# Patient Record
Sex: Female | Born: 1965 | Race: White | Hispanic: No | Smoking: Current every day smoker
Health system: Southern US, Community
[De-identification: ages and names within clinical notes are randomized; demographics above are authoritative.]

## PROBLEM LIST (undated history)

## (undated) DIAGNOSIS — N189 Chronic kidney disease, unspecified: Secondary | ICD-10-CM

## (undated) DIAGNOSIS — F329 Major depressive disorder, single episode, unspecified: Secondary | ICD-10-CM

## (undated) DIAGNOSIS — F1090 Alcohol use, unspecified, uncomplicated: Secondary | ICD-10-CM

## (undated) DIAGNOSIS — F419 Anxiety disorder, unspecified: Secondary | ICD-10-CM

## (undated) DIAGNOSIS — Z7289 Other problems related to lifestyle: Secondary | ICD-10-CM

## (undated) DIAGNOSIS — F1910 Other psychoactive substance abuse, uncomplicated: Secondary | ICD-10-CM

## (undated) DIAGNOSIS — F4310 Post-traumatic stress disorder, unspecified: Secondary | ICD-10-CM

## (undated) DIAGNOSIS — B1920 Unspecified viral hepatitis C without hepatic coma: Secondary | ICD-10-CM

## (undated) DIAGNOSIS — S069X9A Unspecified intracranial injury with loss of consciousness of unspecified duration, initial encounter: Secondary | ICD-10-CM

## (undated) DIAGNOSIS — F32A Depression, unspecified: Secondary | ICD-10-CM

## (undated) DIAGNOSIS — S0990XA Unspecified injury of head, initial encounter: Secondary | ICD-10-CM

## (undated) DIAGNOSIS — I499 Cardiac arrhythmia, unspecified: Secondary | ICD-10-CM

## (undated) DIAGNOSIS — J449 Chronic obstructive pulmonary disease, unspecified: Secondary | ICD-10-CM

## (undated) DIAGNOSIS — M79602 Pain in left arm: Secondary | ICD-10-CM

## (undated) DIAGNOSIS — T8182XA Emphysema (subcutaneous) resulting from a procedure, initial encounter: Secondary | ICD-10-CM

## (undated) HISTORY — PX: TUBAL LIGATION: SHX77

## (undated) HISTORY — PX: FOOT SURGERY: SHX648

---

## 1998-02-27 ENCOUNTER — Emergency Department (HOSPITAL_COMMUNITY): Admission: EM | Admit: 1998-02-27 | Discharge: 1998-02-27 | Payer: Self-pay | Admitting: Emergency Medicine

## 1998-08-16 ENCOUNTER — Emergency Department (HOSPITAL_COMMUNITY): Admission: EM | Admit: 1998-08-16 | Discharge: 1998-08-16 | Payer: Self-pay | Admitting: *Deleted

## 1998-08-17 ENCOUNTER — Encounter: Payer: Self-pay | Admitting: Emergency Medicine

## 1998-08-23 ENCOUNTER — Emergency Department (HOSPITAL_COMMUNITY): Admission: EM | Admit: 1998-08-23 | Discharge: 1998-08-23 | Payer: Self-pay | Admitting: Emergency Medicine

## 1998-08-23 ENCOUNTER — Encounter: Payer: Self-pay | Admitting: Emergency Medicine

## 1998-08-24 ENCOUNTER — Ambulatory Visit (HOSPITAL_COMMUNITY): Admission: RE | Admit: 1998-08-24 | Discharge: 1998-08-24 | Payer: Self-pay | Admitting: Orthopaedic Surgery

## 1998-08-29 ENCOUNTER — Emergency Department (HOSPITAL_COMMUNITY): Admission: EM | Admit: 1998-08-29 | Discharge: 1998-08-29 | Payer: Self-pay | Admitting: Emergency Medicine

## 1998-08-30 ENCOUNTER — Ambulatory Visit (HOSPITAL_BASED_OUTPATIENT_CLINIC_OR_DEPARTMENT_OTHER): Admission: RE | Admit: 1998-08-30 | Discharge: 1998-08-30 | Payer: Self-pay | Admitting: Orthopaedic Surgery

## 1998-09-02 ENCOUNTER — Emergency Department (HOSPITAL_COMMUNITY): Admission: EM | Admit: 1998-09-02 | Discharge: 1998-09-02 | Payer: Self-pay | Admitting: Emergency Medicine

## 1998-09-13 ENCOUNTER — Ambulatory Visit (HOSPITAL_COMMUNITY): Admission: RE | Admit: 1998-09-13 | Discharge: 1998-09-13 | Payer: Self-pay | Admitting: Orthopaedic Surgery

## 2000-11-02 ENCOUNTER — Emergency Department (HOSPITAL_COMMUNITY): Admission: EM | Admit: 2000-11-02 | Discharge: 2000-11-02 | Payer: Self-pay | Admitting: Emergency Medicine

## 2000-11-02 ENCOUNTER — Encounter: Payer: Self-pay | Admitting: Emergency Medicine

## 2000-12-03 ENCOUNTER — Encounter: Payer: Self-pay | Admitting: Emergency Medicine

## 2000-12-03 ENCOUNTER — Emergency Department (HOSPITAL_COMMUNITY): Admission: EM | Admit: 2000-12-03 | Discharge: 2000-12-03 | Payer: Self-pay | Admitting: *Deleted

## 2000-12-14 ENCOUNTER — Emergency Department (HOSPITAL_COMMUNITY): Admission: EM | Admit: 2000-12-14 | Discharge: 2000-12-14 | Payer: Self-pay | Admitting: Emergency Medicine

## 2000-12-18 ENCOUNTER — Emergency Department (HOSPITAL_COMMUNITY): Admission: EM | Admit: 2000-12-18 | Discharge: 2000-12-19 | Payer: Self-pay | Admitting: Emergency Medicine

## 2000-12-18 ENCOUNTER — Encounter: Payer: Self-pay | Admitting: Emergency Medicine

## 2000-12-30 ENCOUNTER — Encounter: Payer: Self-pay | Admitting: Emergency Medicine

## 2000-12-30 ENCOUNTER — Emergency Department (HOSPITAL_COMMUNITY): Admission: EM | Admit: 2000-12-30 | Discharge: 2000-12-30 | Payer: Self-pay | Admitting: *Deleted

## 2001-01-14 ENCOUNTER — Emergency Department (HOSPITAL_COMMUNITY): Admission: EM | Admit: 2001-01-14 | Discharge: 2001-01-14 | Payer: Self-pay | Admitting: Emergency Medicine

## 2002-06-24 ENCOUNTER — Emergency Department (HOSPITAL_COMMUNITY): Admission: EM | Admit: 2002-06-24 | Discharge: 2002-06-24 | Payer: Self-pay | Admitting: Emergency Medicine

## 2002-10-07 ENCOUNTER — Emergency Department (HOSPITAL_COMMUNITY): Admission: EM | Admit: 2002-10-07 | Discharge: 2002-10-07 | Payer: Self-pay | Admitting: Emergency Medicine

## 2003-03-10 ENCOUNTER — Encounter: Payer: Self-pay | Admitting: Emergency Medicine

## 2003-03-10 ENCOUNTER — Emergency Department (HOSPITAL_COMMUNITY): Admission: EM | Admit: 2003-03-10 | Discharge: 2003-03-10 | Payer: Self-pay | Admitting: Emergency Medicine

## 2003-03-19 ENCOUNTER — Emergency Department (HOSPITAL_COMMUNITY): Admission: EM | Admit: 2003-03-19 | Discharge: 2003-03-19 | Payer: Self-pay | Admitting: Emergency Medicine

## 2003-03-21 ENCOUNTER — Encounter: Payer: Self-pay | Admitting: Emergency Medicine

## 2003-03-21 ENCOUNTER — Emergency Department (HOSPITAL_COMMUNITY): Admission: EM | Admit: 2003-03-21 | Discharge: 2003-03-21 | Payer: Self-pay | Admitting: Emergency Medicine

## 2003-03-31 ENCOUNTER — Emergency Department (HOSPITAL_COMMUNITY): Admission: EM | Admit: 2003-03-31 | Discharge: 2003-03-31 | Payer: Self-pay | Admitting: Emergency Medicine

## 2003-04-28 ENCOUNTER — Emergency Department (HOSPITAL_COMMUNITY): Admission: AD | Admit: 2003-04-28 | Discharge: 2003-04-28 | Payer: Self-pay | Admitting: Family Medicine

## 2003-05-03 ENCOUNTER — Emergency Department (HOSPITAL_COMMUNITY): Admission: EM | Admit: 2003-05-03 | Discharge: 2003-05-03 | Payer: Self-pay | Admitting: Emergency Medicine

## 2003-05-27 ENCOUNTER — Emergency Department (HOSPITAL_COMMUNITY): Admission: EM | Admit: 2003-05-27 | Discharge: 2003-05-27 | Payer: Self-pay | Admitting: Emergency Medicine

## 2003-08-11 ENCOUNTER — Emergency Department (HOSPITAL_COMMUNITY): Admission: EM | Admit: 2003-08-11 | Discharge: 2003-08-11 | Payer: Self-pay | Admitting: Emergency Medicine

## 2004-01-03 ENCOUNTER — Emergency Department (HOSPITAL_COMMUNITY): Admission: EM | Admit: 2004-01-03 | Discharge: 2004-01-03 | Payer: Self-pay | Admitting: Emergency Medicine

## 2004-02-20 ENCOUNTER — Emergency Department (HOSPITAL_COMMUNITY): Admission: EM | Admit: 2004-02-20 | Discharge: 2004-02-20 | Payer: Self-pay | Admitting: Emergency Medicine

## 2004-05-12 ENCOUNTER — Emergency Department (HOSPITAL_COMMUNITY): Admission: EM | Admit: 2004-05-12 | Discharge: 2004-05-12 | Payer: Self-pay | Admitting: Emergency Medicine

## 2004-07-15 ENCOUNTER — Emergency Department (HOSPITAL_COMMUNITY): Admission: EM | Admit: 2004-07-15 | Discharge: 2004-07-16 | Payer: Self-pay | Admitting: Emergency Medicine

## 2005-01-03 ENCOUNTER — Emergency Department (HOSPITAL_COMMUNITY): Admission: EM | Admit: 2005-01-03 | Discharge: 2005-01-03 | Payer: Self-pay | Admitting: Emergency Medicine

## 2005-04-20 ENCOUNTER — Emergency Department (HOSPITAL_COMMUNITY): Admission: EM | Admit: 2005-04-20 | Discharge: 2005-04-20 | Payer: Self-pay | Admitting: Emergency Medicine

## 2005-08-15 ENCOUNTER — Emergency Department (HOSPITAL_COMMUNITY): Admission: EM | Admit: 2005-08-15 | Discharge: 2005-08-15 | Payer: Self-pay | Admitting: *Deleted

## 2005-12-09 ENCOUNTER — Emergency Department (HOSPITAL_COMMUNITY): Admission: EM | Admit: 2005-12-09 | Discharge: 2005-12-09 | Payer: Self-pay | Admitting: Emergency Medicine

## 2005-12-11 ENCOUNTER — Emergency Department (HOSPITAL_COMMUNITY): Admission: EM | Admit: 2005-12-11 | Discharge: 2005-12-11 | Payer: Self-pay | Admitting: Emergency Medicine

## 2006-01-16 ENCOUNTER — Emergency Department (HOSPITAL_COMMUNITY): Admission: EM | Admit: 2006-01-16 | Discharge: 2006-01-16 | Payer: Self-pay | Admitting: Emergency Medicine

## 2006-06-09 ENCOUNTER — Emergency Department (HOSPITAL_COMMUNITY): Admission: EM | Admit: 2006-06-09 | Discharge: 2006-06-09 | Payer: Self-pay | Admitting: Emergency Medicine

## 2006-08-05 ENCOUNTER — Emergency Department (HOSPITAL_COMMUNITY): Admission: EM | Admit: 2006-08-05 | Discharge: 2006-08-05 | Payer: Self-pay | Admitting: Emergency Medicine

## 2006-10-20 ENCOUNTER — Emergency Department (HOSPITAL_COMMUNITY): Admission: EM | Admit: 2006-10-20 | Discharge: 2006-10-20 | Payer: Self-pay | Admitting: Emergency Medicine

## 2006-12-02 ENCOUNTER — Emergency Department (HOSPITAL_COMMUNITY): Admission: EM | Admit: 2006-12-02 | Discharge: 2006-12-02 | Payer: Self-pay | Admitting: Emergency Medicine

## 2006-12-29 ENCOUNTER — Emergency Department (HOSPITAL_COMMUNITY): Admission: EM | Admit: 2006-12-29 | Discharge: 2006-12-29 | Payer: Self-pay | Admitting: Emergency Medicine

## 2007-04-16 ENCOUNTER — Emergency Department (HOSPITAL_COMMUNITY): Admission: EM | Admit: 2007-04-16 | Discharge: 2007-04-16 | Payer: Self-pay | Admitting: Emergency Medicine

## 2007-06-15 ENCOUNTER — Emergency Department (HOSPITAL_COMMUNITY): Admission: EM | Admit: 2007-06-15 | Discharge: 2007-06-15 | Payer: Self-pay | Admitting: Emergency Medicine

## 2007-12-01 ENCOUNTER — Emergency Department (HOSPITAL_COMMUNITY): Admission: EM | Admit: 2007-12-01 | Discharge: 2007-12-01 | Payer: Self-pay | Admitting: Emergency Medicine

## 2008-04-03 ENCOUNTER — Emergency Department (HOSPITAL_BASED_OUTPATIENT_CLINIC_OR_DEPARTMENT_OTHER): Admission: EM | Admit: 2008-04-03 | Discharge: 2008-04-03 | Payer: Self-pay | Admitting: Emergency Medicine

## 2008-04-26 ENCOUNTER — Emergency Department (HOSPITAL_BASED_OUTPATIENT_CLINIC_OR_DEPARTMENT_OTHER): Admission: EM | Admit: 2008-04-26 | Discharge: 2008-04-26 | Payer: Self-pay | Admitting: Emergency Medicine

## 2008-05-02 ENCOUNTER — Emergency Department (HOSPITAL_BASED_OUTPATIENT_CLINIC_OR_DEPARTMENT_OTHER): Admission: EM | Admit: 2008-05-02 | Discharge: 2008-05-02 | Payer: Self-pay | Admitting: Emergency Medicine

## 2008-07-13 ENCOUNTER — Emergency Department (HOSPITAL_COMMUNITY): Admission: EM | Admit: 2008-07-13 | Discharge: 2008-07-13 | Payer: Self-pay | Admitting: *Deleted

## 2009-01-22 ENCOUNTER — Emergency Department (HOSPITAL_COMMUNITY): Admission: EM | Admit: 2009-01-22 | Discharge: 2009-01-22 | Payer: Self-pay | Admitting: Emergency Medicine

## 2009-03-29 ENCOUNTER — Emergency Department (HOSPITAL_COMMUNITY): Admission: EM | Admit: 2009-03-29 | Discharge: 2009-03-29 | Payer: Self-pay | Admitting: Emergency Medicine

## 2010-10-14 LAB — ETHANOL: Alcohol, Ethyl (B): <5 mg/dL (ref 0–10)

## 2010-10-14 LAB — DIFFERENTIAL
Basophils Absolute: 0.1 10*3/uL (ref 0.0–0.1)
Basophils Relative: 1 % (ref 0–1)
Eosinophils Relative: 7 % — ABNORMAL HIGH (ref 0–5)
Lymphocytes Relative: 23 % (ref 12–46)
Monocytes Absolute: 0.9 10*3/uL (ref 0.1–1.0)

## 2010-10-14 LAB — LIPASE, BLOOD: Lipase: 34 U/L (ref 11–59)

## 2010-10-14 LAB — COMPREHENSIVE METABOLIC PANEL
AST: 34 U/L (ref 0–37)
Albumin: 3.5 g/dL (ref 3.5–5.2)
BUN: 9 mg/dL (ref 6–23)
GFR calc Af Amer: >60 mL/min (ref >60)
Glucose, Bld: 104 mg/dL — ABNORMAL HIGH (ref 70–99)

## 2010-10-14 LAB — CBC
Hemoglobin: 13.3 g/dL (ref 12.0–15.0)
MCHC: 33.5 g/dL (ref 30.0–36.0)
Platelets: 251 10*3/uL (ref 150–400)
WBC: 11 10*3/uL — ABNORMAL HIGH (ref 4.0–10.5)

## 2010-10-14 LAB — RAPID URINE DRUG SCREEN, HOSP PERFORMED
Cocaine: POSITIVE — AB
Opiates: NOT DETECTED
Tetrahydrocannabinol: NOT DETECTED

## 2011-01-29 ENCOUNTER — Emergency Department (HOSPITAL_COMMUNITY): Payer: Medicaid Other

## 2011-01-29 ENCOUNTER — Inpatient Hospital Stay (HOSPITAL_COMMUNITY)
Admission: EM | Admit: 2011-01-29 | Discharge: 2011-02-03 | DRG: 871 | Disposition: A | Discharge status: Home / Self Care | Payer: Medicaid Other | Attending: Internal Medicine | Admitting: Internal Medicine

## 2011-01-29 ENCOUNTER — Encounter (HOSPITAL_COMMUNITY): Payer: Self-pay

## 2011-01-29 DIAGNOSIS — F341 Dysthymic disorder: Secondary | ICD-10-CM | POA: Diagnosis present

## 2011-01-29 DIAGNOSIS — E8809 Other disorders of plasma-protein metabolism, not elsewhere classified: Secondary | ICD-10-CM | POA: Diagnosis present

## 2011-01-29 DIAGNOSIS — F17200 Nicotine dependence, unspecified, uncomplicated: Secondary | ICD-10-CM | POA: Diagnosis present

## 2011-01-29 DIAGNOSIS — J449 Chronic obstructive pulmonary disease, unspecified: Secondary | ICD-10-CM | POA: Diagnosis present

## 2011-01-29 DIAGNOSIS — A4151 Sepsis due to Escherichia coli [E. coli]: Principal | ICD-10-CM | POA: Diagnosis present

## 2011-01-29 DIAGNOSIS — N170 Acute kidney failure with tubular necrosis: Secondary | ICD-10-CM | POA: Diagnosis present

## 2011-01-29 DIAGNOSIS — F1020 Alcohol dependence, uncomplicated: Secondary | ICD-10-CM | POA: Diagnosis present

## 2011-01-29 DIAGNOSIS — Z79899 Other long term (current) drug therapy: Secondary | ICD-10-CM

## 2011-01-29 DIAGNOSIS — D6959 Other secondary thrombocytopenia: Secondary | ICD-10-CM | POA: Diagnosis present

## 2011-01-29 DIAGNOSIS — J4489 Other specified chronic obstructive pulmonary disease: Secondary | ICD-10-CM | POA: Diagnosis present

## 2011-01-29 DIAGNOSIS — R319 Hematuria, unspecified: Secondary | ICD-10-CM | POA: Diagnosis present

## 2011-01-29 DIAGNOSIS — Z791 Long term (current) use of non-steroidal anti-inflammatories (NSAID): Secondary | ICD-10-CM

## 2011-01-29 DIAGNOSIS — A419 Sepsis, unspecified organism: Secondary | ICD-10-CM | POA: Diagnosis present

## 2011-01-29 DIAGNOSIS — N12 Tubulo-interstitial nephritis, not specified as acute or chronic: Secondary | ICD-10-CM | POA: Diagnosis present

## 2011-01-29 DIAGNOSIS — F1410 Cocaine abuse, uncomplicated: Secondary | ICD-10-CM | POA: Diagnosis present

## 2011-01-29 HISTORY — DX: Anxiety disorder, unspecified: F41.9

## 2011-01-29 HISTORY — DX: Major depressive disorder, single episode, unspecified: F32.9

## 2011-01-29 HISTORY — DX: Chronic obstructive pulmonary disease, unspecified: J44.9

## 2011-01-29 HISTORY — DX: Depression, unspecified: F32.A

## 2011-01-29 LAB — POCT I-STAT, CHEM 8
Chloride: 96 mEq/L (ref 96–112)
Creatinine, Ser: 3 mg/dL — ABNORMAL HIGH (ref 0.50–1.10)
Hemoglobin: 12.9 g/dL (ref 12.0–15.0)
Potassium: 3.2 mEq/L — ABNORMAL LOW (ref 3.5–5.1)

## 2011-01-29 LAB — CBC
Hemoglobin: 12.3 g/dL (ref 12.0–15.0)
MCHC: 34.2 g/dL (ref 30.0–36.0)
Platelets: 109 10*3/uL — ABNORMAL LOW (ref 150–400)
RBC: 3.89 MIL/uL (ref 3.87–5.11)
RDW: 14.8 % (ref 11.5–15.5)

## 2011-01-29 LAB — HEPATIC FUNCTION PANEL
AST: 22 U/L (ref 0–37)
Albumin: 2.1 g/dL — ABNORMAL LOW (ref 3.5–5.2)
Bilirubin, Direct: 0.2 mg/dL (ref 0.0–0.3)
Total Bilirubin: 0.4 mg/dL (ref 0.3–1.2)
Total Bilirubin: 0.4 mg/dL (ref 0.3–1.2)

## 2011-01-29 LAB — URINALYSIS, ROUTINE W REFLEX MICROSCOPIC
Glucose, UA: NEGATIVE mg/dL
Nitrite: NEGATIVE
Protein, ur: >300 mg/dL — AB
Specific Gravity, Urine: 1.021 (ref 1.005–1.030)
pH: 5.5 (ref 5.0–8.0)

## 2011-01-29 LAB — DIFFERENTIAL
Basophils Absolute: 0 10*3/uL (ref 0.0–0.1)
Basophils Relative: 0 % (ref 0–1)
Eosinophils Absolute: 0 10*3/uL (ref 0.0–0.7)
Lymphocytes Relative: 7 % — ABNORMAL LOW (ref 12–46)
Lymphs Abs: 1.1 10*3/uL (ref 0.7–4.0)
Monocytes Absolute: 1.2 10*3/uL — ABNORMAL HIGH (ref 0.1–1.0)
Neutro Abs: 13 10*3/uL — ABNORMAL HIGH (ref 1.7–7.7)
Neutrophils Relative %: 85 % — ABNORMAL HIGH (ref 43–77)

## 2011-01-29 LAB — BLOOD GAS, ARTERIAL
Drawn by: 235321
FIO2: 0.21 %
Patient temperature: 98.6
TCO2: 19.7 mmol/L (ref 0–100)
pH, Arterial: 7.392 (ref 7.350–7.400)

## 2011-01-29 LAB — LIPASE, BLOOD: Lipase: 9 U/L — ABNORMAL LOW (ref 11–59)

## 2011-01-29 LAB — PREGNANCY, URINE: Preg Test, Ur: NEGATIVE

## 2011-01-29 LAB — CK TOTAL AND CKMB (NOT AT ARMC)
CK, MB: 2.6 ng/mL (ref 0.3–4.0)
Total CK: 74 U/L (ref 7–177)

## 2011-01-29 LAB — URINE MICROSCOPIC-ADD ON

## 2011-01-29 LAB — TROPONIN I: Troponin I: <0.3 ng/mL (ref <0.30)

## 2011-01-29 LAB — APTT: aPTT: 37 seconds (ref 24–37)

## 2011-01-30 ENCOUNTER — Inpatient Hospital Stay (HOSPITAL_COMMUNITY): Payer: Medicaid Other

## 2011-01-30 LAB — CBC
Platelets: 97 10*3/uL — ABNORMAL LOW (ref 150–400)
RBC: 3.55 MIL/uL — ABNORMAL LOW (ref 3.87–5.11)
WBC: 9 10*3/uL (ref 4.0–10.5)

## 2011-01-30 LAB — HEPATIC FUNCTION PANEL
ALT: 27 U/L (ref 0–35)
AST: 23 U/L (ref 0–37)
Indirect Bilirubin: 0.1 mg/dL — ABNORMAL LOW (ref 0.3–0.9)
Total Protein: 5.6 g/dL — ABNORMAL LOW (ref 6.0–8.3)

## 2011-01-30 LAB — DIC (DISSEMINATED INTRAVASCULAR COAGULATION)PANEL
INR: 1.12 (ref 0.00–1.49)
Platelets: 94 10*3/uL — ABNORMAL LOW (ref 150–400)

## 2011-01-30 LAB — LIPID PANEL
Cholesterol: 79 mg/dL (ref 0–200)
HDL: 4 mg/dL — ABNORMAL LOW (ref >39)
LDL Cholesterol: 48 mg/dL (ref 0–99)
Triglycerides: 134 mg/dL (ref <150)

## 2011-01-30 LAB — CARDIAC PANEL(CRET KIN+CKTOT+MB+TROPI)
CK, MB: 3.2 ng/mL (ref 0.3–4.0)
Relative Index: INVALID (ref 0.0–2.5)
Total CK: 87 U/L (ref 7–177)
Troponin I: <0.3 ng/mL (ref <0.30)
Troponin I: <0.3 ng/mL (ref <0.30)

## 2011-01-30 LAB — BASIC METABOLIC PANEL
BUN: 29 mg/dL — ABNORMAL HIGH (ref 6–23)
CO2: 25 mEq/L (ref 19–32)
Chloride: 98 mEq/L (ref 96–112)
Creatinine, Ser: 2.67 mg/dL — ABNORMAL HIGH (ref 0.50–1.10)

## 2011-01-30 LAB — DIFFERENTIAL
Eosinophils Relative: 1 % (ref 0–5)
Monocytes Relative: 7 % (ref 3–12)
Neutrophils Relative %: 82 % — ABNORMAL HIGH (ref 43–77)

## 2011-01-30 LAB — MRSA PCR SCREENING: MRSA by PCR: NEGATIVE

## 2011-01-30 LAB — GLUCOSE, CAPILLARY: Glucose-Capillary: 107 mg/dL — ABNORMAL HIGH (ref 70–99)

## 2011-01-30 LAB — OSMOLALITY: Osmolality: 275 mOsm/kg (ref 275–300)

## 2011-01-30 LAB — SODIUM, URINE, RANDOM: Sodium, Ur: 21 mEq/L

## 2011-01-30 LAB — LACTATE DEHYDROGENASE: LDH: 214 U/L (ref 94–250)

## 2011-01-31 LAB — URINE CULTURE: Culture  Setup Time: 201208020140

## 2011-01-31 LAB — DIFFERENTIAL
Basophils Absolute: 0 10*3/uL (ref 0.0–0.1)
Eosinophils Absolute: 0.3 10*3/uL (ref 0.0–0.7)
Lymphocytes Relative: 14 % (ref 12–46)
Monocytes Relative: 12 % (ref 3–12)
Neutrophils Relative %: 70 % (ref 43–77)

## 2011-01-31 LAB — RENAL FUNCTION PANEL
Albumin: 1.8 g/dL — ABNORMAL LOW (ref 3.5–5.2)
Calcium: 7.6 mg/dL — ABNORMAL LOW (ref 8.4–10.5)
GFR calc Af Amer: 28 mL/min — ABNORMAL LOW (ref >60)
GFR calc non Af Amer: 23 mL/min — ABNORMAL LOW (ref >60)
Glucose, Bld: 98 mg/dL (ref 70–99)
Phosphorus: 3.7 mg/dL (ref 2.3–4.6)
Sodium: 133 mEq/L — ABNORMAL LOW (ref 135–145)

## 2011-01-31 LAB — CBC
MCHC: 34.2 g/dL (ref 30.0–36.0)
Platelets: 95 10*3/uL — ABNORMAL LOW (ref 150–400)
RDW: 15.2 % (ref 11.5–15.5)

## 2011-01-31 LAB — GLUCOSE, CAPILLARY
Glucose-Capillary: 90 mg/dL (ref 70–99)
Glucose-Capillary: 137 mg/dL — ABNORMAL HIGH (ref 70–99)
Glucose-Capillary: 130 mg/dL — ABNORMAL HIGH (ref 70–99)

## 2011-02-01 ENCOUNTER — Inpatient Hospital Stay (HOSPITAL_COMMUNITY): Payer: Medicaid Other

## 2011-02-01 LAB — CULTURE, BLOOD (ROUTINE X 2): Culture  Setup Time: 201208020142

## 2011-02-01 LAB — CBC
Hemoglobin: 10.3 g/dL — ABNORMAL LOW (ref 12.0–15.0)
MCH: 31.3 pg (ref 26.0–34.0)
RBC: 3.29 MIL/uL — ABNORMAL LOW (ref 3.87–5.11)

## 2011-02-01 LAB — RENAL FUNCTION PANEL
CO2: 19 mEq/L (ref 19–32)
Calcium: 7.8 mg/dL — ABNORMAL LOW (ref 8.4–10.5)
GFR calc Af Amer: 31 mL/min — ABNORMAL LOW (ref >60)
Glucose, Bld: 115 mg/dL — ABNORMAL HIGH (ref 70–99)
Potassium: 3.5 mEq/L (ref 3.5–5.1)
Sodium: 138 mEq/L (ref 135–145)

## 2011-02-02 LAB — CBC
HCT: 29.6 % — ABNORMAL LOW (ref 36.0–46.0)
Hemoglobin: 9.9 g/dL — ABNORMAL LOW (ref 12.0–15.0)
WBC: 8.3 10*3/uL (ref 4.0–10.5)

## 2011-02-02 LAB — BASIC METABOLIC PANEL
BUN: 14 mg/dL (ref 6–23)
Chloride: 108 mEq/L (ref 96–112)
Creatinine, Ser: 1.9 mg/dL — ABNORMAL HIGH (ref 0.50–1.10)
GFR calc Af Amer: 35 mL/min — ABNORMAL LOW (ref >60)

## 2011-02-02 LAB — CREATININE, URINE, 24 HOUR
Collection Interval-UCRE24: 24 hours
Creatinine, 24H Ur: 1345 mg/d (ref 700–1800)
Creatinine, Urine: 32.8 mg/dL
Urine Total Volume-UCRE24: 4100 mL

## 2011-02-03 LAB — UIFE/LIGHT CHAINS/TP QN, 24-HR UR
Alpha 2, Urine: DETECTED — AB
Beta, Urine: DETECTED — AB
Gamma Globulin, Urine: DETECTED — AB

## 2011-02-03 LAB — COMPREHENSIVE METABOLIC PANEL
ALT: 21 U/L (ref 0–35)
AST: 26 U/L (ref 0–37)
Albumin: 1.7 g/dL — ABNORMAL LOW (ref 3.5–5.2)
CO2: 22 mEq/L (ref 19–32)
Calcium: 8.2 mg/dL — ABNORMAL LOW (ref 8.4–10.5)
Sodium: 139 mEq/L (ref 135–145)
Total Protein: 6 g/dL (ref 6.0–8.3)

## 2011-02-03 LAB — PROTEIN ELECTROPHORESIS, SERUM
Alpha-1-Globulin: 12.6 % — ABNORMAL HIGH (ref 2.9–4.9)
Alpha-2-Globulin: 14.3 % — ABNORMAL HIGH (ref 7.1–11.8)
Beta 2: 5.7 % (ref 3.2–6.5)
Beta Globulin: 4.2 % — ABNORMAL LOW (ref 4.7–7.2)
Gamma Globulin: 19.6 % — ABNORMAL HIGH (ref 11.1–18.8)

## 2011-02-11 NOTE — Discharge Summary (Signed)
[name], Fisher                ACCOUNT NO.:  [id number]  MEDICAL RECORD NO.:  [id number]  LOCATION:  1520                         FACILITY:  [facility]  PHYSICIAN:  Erick Blinks, MD     DATE OF BIRTH:  12-31-1965  DATE OF ADMISSION:  01/29/2011 DATE OF DISCHARGE:  02/03/2011                              DISCHARGE SUMMARY   DISCHARGE DIAGNOSES: 1. Escherichia coli bacteremia and sepsis. 2. Pyelonephritis secondary to Escherichia coli. 3. Acute renal failure with acute tubular necrosis. 4. History of NSAID use. 5. History of alcoholism. 6. History of chronic obstructive pulmonary disease. 7. Proteinuria secondary to #2. 8. Hypoalbuminemia secondary to #2. 9. Ongoing tobacco use. 10.History of crack cocaine use. 11.History of depression. 12.History of anxiety. 13.Thrombocytopenia secondary to sepsis, resolved.  DISCHARGE MEDICATIONS: 1. Ciprofloxacin 500 mg p.o. b.i.d. for seven more days. 2. Vistaril 25 mg 1-2 capsules p.o. q.4 h. p.r.n. for anxiety. 3. Celexa 20 mg p.o. daily. 4. Neurontin 300 mg p.o. t.i.d. 5. Trazodone 150 mg 1 tablet p.o. at bedtime. 6. Advair 250/50 one puff p.o. daily. 7. Tramadol 50 mg 2 tablets p.o. t.i.d. p.r.n. 8. Combivent inhaler 2 puffs inhaled b.i.d. 9. Vitamin B12 1 tablet p.o. daily. 10.Vitamin B6 1 tablet p.o. daily.  ADMISSION HISTORY:  This is a 45 year old Caucasian female who was admitted to the emergency room with complaints of nausea, vomiting, and generalized body ache.  She has symptoms of left flank pain which gradually worsened and she started to develop fevers, chills.  In the emergency room, she was found to have a creatinine of 3 as well as a positive urinalysis.  CT abdomen and pelvis done on admission showed no evidence of urolithiasis, hydronephrosis, or acute findings.  The patient was subsequently admitted for further evaluation and treatment.  HOSPITAL COURSE: 1. Sepsis.  The patient was admitted to the step-down  unit.  She was     started empirically on ceftriaxone for pyelonephritis.  Her blood     cultures were positive for E. coli which was the bacteria that was     also found in her urine.  Since being on treatment the patient has     defervesced.  Her white count has improved into a normal range.     She is clinically improved and is requesting a discharge home.  We     will complete a course of ciprofloxacin by mouth for a total of 2     weeks of antibiotics, and should follow up with her primary care     physician for further management. 2. Acute renal failure.  The patient had creatinine of 3 on admission.     She also reports taking large amounts of ibuprofen at home for pain     reasons.  She was adequately hydrated here in the hospital.  Her     creatinine has continued to improve.  She is currently off IV     fluids and her creatinine is continued to improve down to 1.6     currently.  She is stable for discharge at this point and can     follow up with her primary care doctor for further  management.  DIAGNOSTIC IMAGING: 1. Chest x-ray on admission shows no evidence of acute cardiopulmonary     disease. 2. CT abdomen and pelvis on admission showed no evidence of     urolithiasis, hydronephrosis, or other acute findings, stable small     hiatal hernia. 3. Chest x-ray on August 4 shows mild vascular congestion and     interstitial edema.  DISCHARGE INSTRUCTIONS:  The patient continued on a regular diet. Conduct her activity as tolerated.  She will see her primary care doctor in next 1 week.  She has been advised to avoid any NSAIDS.  CONDITION AT TIME OF DISCHARGE:  Improved.     Erick Blinks, MD     JM/MEDQ  D:  02/03/2011  T:  02/04/2011  Job:  [id number]  Electronically Signed by Durward Mallard MEMON  on 02/11/2011 05:06:22 PM

## 2011-03-04 NOTE — H&P (Signed)
Charlotte Fisher, Charlotte Fisher                ACCOUNT NO.:  [id number]  MEDICAL RECORD NO.:  [id number]  LOCATION:  WLED                         FACILITY:  [facility]  PHYSICIAN:  Eduard Clos, MDDATE OF BIRTH:  [birth year]  DATE OF ADMISSION:  01/29/2011 DATE OF DISCHARGE:                             HISTORY & PHYSICAL   PRIMARY CARE PHYSICIAN:  [place], [place].  The patient has been sitting here at [facility] to appear at court.  CHIEF COMPLAINT:  Nausea, vomiting, generalized body ache.  HISTORY OF PRESENT ILLNESS:  This is a 45 year old female with history of COPD, ongoing tobacco abuse, history of alcoholism, has been experiencing generalized body ache along with nausea, vomiting.  No diarrhea.  The patient is actually constipated.  The patient stated the symptoms started off as a left flank pain, which gradually worsened and started developing fever, chills, and eventually generalized body ache with multiple episode of nausea, vomiting.  Denies any blood in the vomitus.  The patient denies any chest pain or shortness of breath, has noted some bruising in the left lower extremity over the last 1 week. The patient denies any dizziness or loss of conscious, any focal deficit.  In the ER, the patient was found to have a creatinine of 3, just new for the patient when compared to old labs.  Along with that, the patient is also having leukocytosis and a UA, which is compatible with UTI.  PAST MEDICAL HISTORY: 1. History of COPD. 2. Ongoing tobacco abuse. 3. History of alcoholism.  The patient states the last drink she had     was a week ago and before that, she has not drunk for 2-3 months.     Otherwise, she used to drink constantly liquor and beer for almost     20 years. 4. Also has history of a crack cocaine abuse. 5. Also has history of depression and anxiety.  PAST SURGICAL HISTORY:  None.  MEDICATIONS PRIOR TO ADMISSION:  Advair Diskus, Combivent, Celexa, Neurontin,  ProAir, trazodone, Vistaril, dose of which has to be verified.  ALLERGIES:  No known drug allergies.  FAMILY HISTORY:  Significant for thyroid problems, diabetes mellitus type 2, and uterine cancer in mom.  SOCIAL HISTORY:  The patient smokes cigarettes, used to drinks alcohol every day, but has not drunk for last 1 week, the last drink she had was 1 week ago and before that, she has not drunk for 2-3 months, but otherwise, the patient states she was drinking every day liquor and beer for almost 20 years, also has a history of crack cocaine abuse.  REVIEW OF SYSTEMS:  As per the history of present illness, nothing else significant.  PHYSICAL EXAMINATION:  GENERAL:  The patient examined at bedside, not in acute distress. VITAL SIGNS:  Blood pressure is 94/60, pulse is 112 per minute, temperature 98.3, respiration 20 per minute, O2 sat 95%. HEENT:  Anicteric.  No pallor.  No facial asymmetry.  Tongue is midline. No discharge from ears, eyes, nose, or mouth. NECK:  No neck rigidity. CHEST:  Bilateral air entry present.  No rhonchi, no crepitation. HEART:  S1 and S2 heard. ABDOMEN:  Soft, nontender.  Bowel  sounds present. CNS:  The patient is alert, awake, oriented to time, place, and person, is able to move upper and lower extremities. EXTREMITIES:  There is bruising on the left anterior shin and also the calf.  The calf bruise is almost like a 10 cm in diameter.  There is also small bruise on the right anterior shin.  There are no acute ischemic changes, cyanosis, or clubbing.  LABORATORY DATA:  EKG has been ordered.  Chest x-ray shows no evidence of acute cardiopulmonary disease.  CBC; WBCs 15.3, hemoglobin is 12.9, hematocrit 38, platelets 109, neutrophils 85%.  Basic metabolic panel; sodium 132, potassium 3.2, chloride 96, glucose 102, BUN 28, creatinine 3.  UA showing amber, turbid, bilirubin small, ketones trace, blood moderate, protein more than 300, nitrites negative,  leukocytes large, squamous cells few, wbc too numerous to count, rbc 11-20, bacteria many, no mucus present,  ASSESSMENT: 1. Acute renal failure. 2. Urinary tract infection with possible pyelonephritis. 3. Thrombocytopenia. 4. Bruise in the lower extremities. 5. History of ongoing tobacco abuse and history of alcoholism and     crack cocaine abuse. 6. History of chronic obstructive pulmonary disease and asthma. 7. History of anxiety and depression. 8. Significant proteinuria and hematuria.  PLAN: 1. At this time, we will admit the patient to step-down unit. 2. For her acute renal failure, at this time eight, it could possibly     be from persistent nausea and vomiting.  We are going to     aggressively hydrate the patient with IV fluids.  We are going to     place the Foley and closely monitor intake output.  The patient     does have significant hematuria and proteinuria, for which we are     going to check spot urine protein and spot urine creatinine.  We     will also check urine creatinine and sodium and eosinophils. 3. Thrombocytopenia.  At this time, it is very concerning.  The     patient's peripheral smear does not show any schistocytes.  I am     going to repeat peripheral smear study.  I am going to get a DIC     panel.  I am also going to get LDH as the patient also has     bruising.  We have to keep in mind DIC, TTP as possible     differential. 4. Left flank pain with urine showing possible urinary tract     infection.  At this time, the patient possibly has UTI with     pyelonephritis.  This time I am going to get blood cultures, urine     cultures as suggested earlier.  We are going to aggressively     hydrate the patient.  The patient is already started on     ceftriaxone, which we will continue.  I am going to get the lactic     acid level, procalcitonin level, and also going to check a LDH     level specifically to look if there is any hemolysis and also going      to get a CT of abdomen and pelvis without contrast to particularly     look into her pancreas and kidneys specifically look into for any     urinary tract obstruction. 5. Generalized body ache with fever and chills.  I am going to get     blood cultures, urine cultures.  I am going to aggressively hydrate  the patient.  We are going to get CK levels.  I am also going to     cycle cardiac markers, get an EKG. 6. History of chronic obstructive pulmonary disease, for which we are     going to continue albuterol and Atrovent at this time.  We will     also continue with Pulmicort.  The patient at this time is not     short of breath. 7. We need to verify all medications. 8. We are going to also get urine drug screen. 9. The patient has history of alcoholism.  The patient states she has     not drunk for the last 1 week and prior to that, she has not drunk     for a couple of months.  At this time, I am going to keep the     patient on p.r.n. Ativan for the anxiety. 10.Further recommendation based on test and other clinical course.     Eduard Clos, MD     ANK/MEDQ  D:  01/29/2011  T:  01/29/2011  Job:  [id number]  Electronically Signed by Midge Minium MD on 03/04/2011 09:04:45 AM

## 2011-04-01 LAB — HEPATIC FUNCTION PANEL
ALT: 38 — ABNORMAL HIGH
AST: 35
Alkaline Phosphatase: 83
Bilirubin, Direct: 0
Indirect Bilirubin: 0.2 — ABNORMAL LOW
Total Bilirubin: 0.2 — ABNORMAL LOW

## 2011-04-01 LAB — CBC
HCT: 41.8
MCHC: 33.3
MCV: 96.8
Platelets: 295

## 2011-04-01 LAB — DIFFERENTIAL
Basophils Relative: 1
Eosinophils Absolute: 0.7
Eosinophils Relative: 7 — ABNORMAL HIGH
Monocytes Relative: 8
Neutrophils Relative %: 47

## 2011-04-01 LAB — URINALYSIS, ROUTINE W REFLEX MICROSCOPIC
Bilirubin Urine: NEGATIVE
Glucose, UA: NEGATIVE
Ketones, ur: NEGATIVE
Protein, ur: NEGATIVE

## 2011-04-01 LAB — BASIC METABOLIC PANEL
BUN: 15
CO2: 28
Chloride: 106
Creatinine, Ser: 0.8
Potassium: 3.5

## 2011-04-01 LAB — PROTIME-INR: INR: 0.9

## 2011-04-01 LAB — RAPID STREP SCREEN (MED CTR MEBANE ONLY): Streptococcus, Group A Screen (Direct): NEGATIVE

## 2011-04-04 LAB — RAPID URINE DRUG SCREEN, HOSP PERFORMED
Amphetamines: NOT DETECTED
Barbiturates: NOT DETECTED
Benzodiazepines: POSITIVE — AB
Cocaine: POSITIVE — AB
Opiates: NOT DETECTED
Tetrahydrocannabinol: NOT DETECTED

## 2011-04-04 LAB — BASIC METABOLIC PANEL
BUN: 2 — ABNORMAL LOW
CO2: 25
Calcium: 8.8
Creatinine, Ser: 0.86
GFR calc non Af Amer: >60
Glucose, Bld: 147 — ABNORMAL HIGH

## 2011-04-04 LAB — DIFFERENTIAL
Basophils Absolute: 0
Basophils Relative: 1
Eosinophils Absolute: 0.5
Eosinophils Relative: 7 — ABNORMAL HIGH
Lymphocytes Relative: 33
Lymphs Abs: 2.7
Monocytes Absolute: 0.6
Monocytes Relative: 8
Neutro Abs: 4.2
Neutrophils Relative %: 52

## 2011-04-04 LAB — CBC
Hemoglobin: 14.5
MCHC: 35
Platelets: 278
RDW: 14.4

## 2011-04-17 LAB — HEPATIC FUNCTION PANEL
AST: 31
Albumin: 3.7

## 2011-04-17 LAB — CBC
Hemoglobin: 15.6 — ABNORMAL HIGH
Platelets: 344
RDW: 13.1
WBC: 11.8 — ABNORMAL HIGH

## 2011-04-17 LAB — DIFFERENTIAL
Basophils Absolute: 0.1
Lymphocytes Relative: 21
Neutro Abs: 7.6
Neutrophils Relative %: 64

## 2011-04-17 LAB — BASIC METABOLIC PANEL
BUN: 13
Calcium: 9
GFR calc non Af Amer: >60
Glucose, Bld: 155 — ABNORMAL HIGH
Potassium: 3.6
Sodium: 137

## 2011-04-17 LAB — PREGNANCY, URINE: Preg Test, Ur: NEGATIVE

## 2011-04-17 LAB — ETHANOL: Alcohol, Ethyl (B): <5

## 2011-11-05 ENCOUNTER — Other Ambulatory Visit: Payer: Self-pay | Admitting: Internal Medicine

## 2011-11-19 ENCOUNTER — Emergency Department (HOSPITAL_COMMUNITY)
Admission: EM | Admit: 2011-11-19 | Discharge: 2011-11-19 | Disposition: A | Discharge status: Home / Self Care | Payer: Medicaid Other | Source: Home / Self Care | Attending: Emergency Medicine | Admitting: Emergency Medicine

## 2011-11-19 ENCOUNTER — Encounter (HOSPITAL_COMMUNITY): Payer: Self-pay

## 2011-11-19 DIAGNOSIS — F411 Generalized anxiety disorder: Secondary | ICD-10-CM

## 2011-11-19 DIAGNOSIS — F419 Anxiety disorder, unspecified: Secondary | ICD-10-CM

## 2011-11-19 DIAGNOSIS — J329 Chronic sinusitis, unspecified: Secondary | ICD-10-CM

## 2011-11-19 MED ORDER — AMITRIPTYLINE HCL 10 MG PO TABS: 10.0000 mg | ORAL_TABLET | Freq: Every day | ORAL | Status: DC

## 2011-11-19 MED ORDER — HYDROXYZINE HCL 25 MG PO TABS: 25.0000 mg | ORAL_TABLET | Freq: Once | ORAL | Status: DC

## 2011-11-19 MED ORDER — AMOXICILLIN 500 MG PO CAPS: 500.0000 mg | ORAL_CAPSULE | Freq: Three times a day (TID) | ORAL | Status: DC

## 2011-11-19 MED ORDER — IPRATROPIUM-ALBUTEROL 20-100 MCG/ACT IN AERS: 1.0000 | INHALATION_SPRAY | Freq: Four times a day (QID) | RESPIRATORY_TRACT | Status: DC

## 2011-11-19 MED ORDER — BUSPIRONE HCL 10 MG PO TABS: 10.0000 mg | ORAL_TABLET | Freq: Three times a day (TID) | ORAL | Status: DC

## 2011-11-19 MED ORDER — AMOXICILLIN 500 MG PO CAPS: 500.0000 mg | ORAL_CAPSULE | Freq: Three times a day (TID) | ORAL | Status: AC

## 2011-11-19 NOTE — ED Provider Notes (Signed)
Medical screening examination/treatment/procedure(s) were performed by non-physician practitioner and as supervising physician I was immediately available for consultation/collaboration.  Raynald Blend, MD 11/19/11 1344

## 2011-11-19 NOTE — Discharge Instructions (Signed)
Anxiety and Panic Attacks Your caregiver has informed you that you are having an anxiety or panic attack. There may be many forms of this. Most of the time these attacks come suddenly and without warning. They come at any time of day, including periods of sleep, and at any time of life. They may be strong and unexplained. Although panic attacks are very scary, they are physically harmless. Sometimes the cause of your anxiety is not known. Anxiety is a protective mechanism of the body in its fight or flight mechanism. Most of these perceived danger situations are actually nonphysical situations (such as anxiety over losing a job). CAUSES  The causes of an anxiety or panic attack are many. Panic attacks may occur in otherwise healthy people given a certain set of circumstances. There may be a genetic cause for panic attacks. Some medications may also have anxiety as a side effect. SYMPTOMS  Some of the most common feelings are:  Intense terror.   Dizziness, feeling faint.   Hot and cold flashes.   Fear of going crazy.   Feelings that nothing is real.   Sweating.   Shaking.   Chest pain or a fast heartbeat (palpitations).   Smothering, choking sensations.   Feelings of impending doom and that death is near.   Tingling of extremities, this may be from over-breathing.   Altered reality (derealization).   Being detached from yourself (depersonalization).  Several symptoms can be present to make up anxiety or panic attacks. DIAGNOSIS  The evaluation by your caregiver will depend on the type of symptoms you are experiencing. The diagnosis of anxiety or panic attack is made when no physical illness can be determined to be a cause of the symptoms. TREATMENT  Treatment to prevent anxiety and panic attacks may include:  Avoidance of circumstances that cause anxiety.   Reassurance and relaxation.   Regular exercise.   Relaxation therapies, such as yoga.   Psychotherapy with a  psychiatrist or therapist.   Avoidance of caffeine, alcohol and illegal drugs.   Prescribed medication.  SEEK IMMEDIATE MEDICAL CARE IF:   You experience panic attack symptoms that are different than your usual symptoms.   You have any worsening or concerning symptoms.  Document Released: 06/16/2005 Document Revised: 06/05/2011 Document Reviewed: 10/18/2009 [facility] Patient Information 2012 [place], [place].Sinusitis Sinuses are air pockets within the bones of your face. The growth of bacteria within a sinus leads to infection. The infection prevents the sinuses from draining. This infection is called sinusitis. SYMPTOMS  There will be different areas of pain depending on which sinuses have become infected.  The maxillary sinuses often produce pain beneath the eyes.   Frontal sinusitis may cause pain in the middle of the forehead and above the eyes.  Other problems (symptoms) include:  Toothaches.   Colored, pus-like (purulent) drainage from the nose.   Swelling, warmth, and tenderness over the sinus areas may be signs of infection.  TREATMENT  Sinusitis is most often determined by an exam.X-rays may be taken. If x-rays have been taken, make sure you obtain your results or find out how you are to obtain them. Your caregiver may give you medications (antibiotics). These are medications that will help kill the bacteria causing the infection. You may also be given a medication (decongestant) that helps to reduce sinus swelling.  HOME CARE INSTRUCTIONS   Only take over-the-counter or prescription medicines for pain, discomfort, or fever as directed by your caregiver.   Drink extra fluids. Fluids help thin  the mucus so your sinuses can drain more easily.   Applying either moist heat or ice packs to the sinus areas may help relieve discomfort.   Use saline nasal sprays to help moisten your sinuses. The sprays can be found at your local drugstore.  SEEK IMMEDIATE MEDICAL CARE IF:  You  have a fever.   You have increasing pain, severe headaches, or toothache.   You have nausea, vomiting, or drowsiness.   You develop unusual swelling around the face or trouble seeing.  MAKE SURE YOU:   Understand these instructions.   Will watch your condition.   Will get help right away if you are not doing well or get worse.  Document Released: 06/16/2005 Document Revised: 06/05/2011 Document Reviewed: 01/13/2007 [facility] Patient Information 2012 [place], [place]. Go to www.goodrx.com to look up your medications. This will give you a list of where you can find your prescriptions at the most affordable prices.   Call [facility]  [phone]  If you have no primary doctor, here are some resources that may be helpful:  Medicaid-accepting [facility] Providers:   - [facility]- [address]      [phone]      Mon-Fri 9am-7pm, Sat 9am-1pm   - [facility]- [address] [place], [place]      [phone]   - [facility]- [address], [address] [place]      [phone]   [facility]- [address]      [phone]   - Renaye Rakers- [address] [place], [address]      [phone]      Only accepts [place] patients       after they have her name applied to their card   Self Pay (no insurance) in [place]:   - Sickle Cell Patients: Dr Willey Blade, [facility]      [address] [place]      [phone]   - [facility] [phone]   - Physician Referral Service- [phone]   - [facility]- [address] [place]      [phone]   [facility] [place]- [address]   - [facility]- see information above      (Speak to [employer] if you do not have insurance)   - [facility]- [address] [place]      [phone]   - [facility] [place]- [address]      [phone]   - [facility]- [address]      [phone]   - Dr Julio Sicks-  [address], [place]      [phone]   - [facility]- [address]      [phone]   - [facility] [place] [facility]- [address]      [phone]      Also [address]      [phone]   - [facility]- [address]      [phone]      1st and 3rd Saturday every month, 10am-1pm    Other agencies that provide inexpensive medical care:     [facility]  [phone]    [facility]  [phone]    [facility]  [phone]  [facility]  [phone] [address] [place] [address]    [facility]  [phone]    [facility]  [phone] [facility] [phone]   [address] [place], [place] [address]  Chronic Pain Problems Contact [facility]  [phone] Patients need to be referred by their primary care doctor.  [facility]  [facility]     [employer]                          [facility] Dept. [address] [place]                       [address]      [address] [place] [address]   [phone] (After Hours)  General Information: Finding a doctor when you do not have health insurance can be tricky. Although you are not limited by an insurance plan, you are of course limited by her finances and how much but he can pay out of pocket.  What are your options if you don't have health insurance?   1) Find a Librarian, academic and Pay Out of Pocket Although you won't have to find out who is covered by your insurance plan, it is a good idea to ask around and get recommendations. You will then need to call the office and see if the doctor you have chosen will accept you as a new patient and what types of options they offer for patients who are self-pay. Some doctors offer discounts or will set up payment plans for their patients who do not  have insurance, but you will need to ask so you aren't surprised when you get to your appointment.  2) Contact Your Local Health Department Not all health departments have doctors that can see patients for sick visits, but many do, so it is worth a call to see if yours does. If you don't know where your local health department is, you can check in your phone book. The CDC also has a tool to help you locate your state's health department, and many state websites also have listings of all of their local health departments.  3) Find a Walk-in Clinic If your illness is not likely to be very severe or complicated, you may want to try a walk in clinic. These are popping up all over the country in pharmacies, drugstores, and shopping centers. They're usually staffed by nurse practitioners or physician assistants that have been trained to treat common illnesses and complaints. They're usually fairly quick and inexpensive. However, if you have serious medical issues or chronic medical problems, these are probably not your best option

## 2011-11-19 NOTE — ED Provider Notes (Signed)
History     CSN: [id number]  Arrival date & time 11/19/11  1120   None     Chief Complaint  Patient presents with  . Mental Health Problem    (Consider location/radiation/quality/duration/timing/severity/associated sxs/prior treatment) Patient is a 46 y.o. female presenting with cough. The history is provided by the patient. No language interpreter was used.  Cough This is a new problem. The current episode started more than 1 week ago. The problem occurs constantly. The problem has been gradually worsening. The cough is productive of sputum. There has been no fever. Associated symptoms include rhinorrhea and sore throat. Pertinent negatives include no shortness of breath. She has tried nothing for the symptoms. The treatment provided no relief. She is a smoker.   Pt complains of a cough and congestion.  Pt is out of her psychiatric medications. Pt unable to get in to mental health Past Medical History  Diagnosis Date  . Asthma   . COPD (chronic obstructive pulmonary disease)   . Depression   . Anxiety     No past surgical history on file.  No family history on file.  History  Substance Use Topics  . Smoking status: Not on file  . Smokeless tobacco: Not on file  . Alcohol Use: Not on file    OB History    Grav Para Term Preterm Abortions TAB SAB Ect Mult Living                  Review of Systems  HENT: Positive for sore throat and rhinorrhea.   Respiratory: Positive for cough. Negative for shortness of breath.   Psychiatric/Behavioral: Positive for agitation.  All other systems reviewed and are negative.    Allergies  Review of patient's allergies indicates no known allergies.  Home Medications  No current outpatient prescriptions on file.  BP 139/98  Pulse 102  Temp(Src) 98.3 F (36.8 C) (Oral)  Resp 24  SpO2 97%  Physical Exam  Nursing note and vitals reviewed. Constitutional: She is oriented to person, place, and time. She appears well-developed  and well-nourished.  HENT:  Head: Normocephalic and atraumatic.  Right Ear: External ear normal.  Left Ear: External ear normal.  Nose: Nose normal.  Mouth/Throat: Oropharynx is clear and moist.  Eyes: Conjunctivae are normal. Pupils are equal, round, and reactive to light.  Neck: Normal range of motion. Neck supple.  Cardiovascular: Normal rate and normal heart sounds.   Pulmonary/Chest: Effort normal and breath sounds normal.  Abdominal: Soft.  Musculoskeletal: Normal range of motion.  Neurological: She is alert and oriented to person, place, and time.  Skin: Skin is warm.  Psychiatric: She has a normal mood and affect.    ED Course  Procedures (including critical care time)  Labs Reviewed - No data to display No results found.   1. Sinusitis   2. Anxiety       MDM  Rx for amoxicillian  10 day rx for elavil, visteril and buspar.   Pt given primary care referrals        Lonia Skinner [place], [place] 11/19/11 1332

## 2011-11-19 NOTE — ED Notes (Addendum)
States she moved here from [facility] 3 weeks ago, out of all her medications ; c/o she feels dizzy, posterior area HA, laceration to index finger 3 weeks ago that will not heal, swelling on left side of her face every day; has a boil in her groin that she placed a raw potato on to draw out the infection and it drains all the time; has been instructed by mental health she will need to go through screening process before she can be seen by MD there ( had not scheduled an appointment before arrival to [facility] )

## 2012-06-22 ENCOUNTER — Emergency Department (HOSPITAL_COMMUNITY)
Admission: EM | Admit: 2012-06-22 | Discharge: 2012-06-22 | Disposition: A | Discharge status: Home / Self Care | Payer: Medicaid Other | Attending: Emergency Medicine | Admitting: Emergency Medicine

## 2012-06-22 ENCOUNTER — Encounter (HOSPITAL_COMMUNITY): Payer: Self-pay | Admitting: Emergency Medicine

## 2012-06-22 ENCOUNTER — Emergency Department (HOSPITAL_COMMUNITY): Payer: Medicaid Other

## 2012-06-22 DIAGNOSIS — J069 Acute upper respiratory infection, unspecified: Secondary | ICD-10-CM | POA: Insufficient documentation

## 2012-06-22 DIAGNOSIS — F3289 Other specified depressive episodes: Secondary | ICD-10-CM | POA: Insufficient documentation

## 2012-06-22 DIAGNOSIS — F329 Major depressive disorder, single episode, unspecified: Secondary | ICD-10-CM | POA: Insufficient documentation

## 2012-06-22 DIAGNOSIS — J45901 Unspecified asthma with (acute) exacerbation: Secondary | ICD-10-CM | POA: Insufficient documentation

## 2012-06-22 DIAGNOSIS — F411 Generalized anxiety disorder: Secondary | ICD-10-CM | POA: Insufficient documentation

## 2012-06-22 DIAGNOSIS — J441 Chronic obstructive pulmonary disease with (acute) exacerbation: Secondary | ICD-10-CM | POA: Insufficient documentation

## 2012-06-22 DIAGNOSIS — Z79899 Other long term (current) drug therapy: Secondary | ICD-10-CM | POA: Insufficient documentation

## 2012-06-22 DIAGNOSIS — F17200 Nicotine dependence, unspecified, uncomplicated: Secondary | ICD-10-CM | POA: Insufficient documentation

## 2012-06-22 DIAGNOSIS — IMO0002 Reserved for concepts with insufficient information to code with codable children: Secondary | ICD-10-CM | POA: Insufficient documentation

## 2012-06-22 LAB — BASIC METABOLIC PANEL
CO2: 24 mEq/L (ref 19–32)
Calcium: 8.8 mg/dL (ref 8.4–10.5)
Chloride: 98 mEq/L (ref 96–112)
Glucose, Bld: 186 mg/dL — ABNORMAL HIGH (ref 70–99)
Sodium: 131 mEq/L — ABNORMAL LOW (ref 135–145)

## 2012-06-22 LAB — CBC WITH DIFFERENTIAL/PLATELET
Eosinophils Relative: 0 % (ref 0–5)
HCT: 42.8 % (ref 36.0–46.0)
Lymphocytes Relative: 9 % — ABNORMAL LOW (ref 12–46)
Lymphs Abs: 0.5 10*3/uL — ABNORMAL LOW (ref 0.7–4.0)
MCV: 95.5 fL (ref 78.0–100.0)
Neutro Abs: 5.3 10*3/uL (ref 1.7–7.7)
Platelets: 203 10*3/uL (ref 150–400)
RBC: 4.48 MIL/uL (ref 3.87–5.11)
WBC: 6 10*3/uL (ref 4.0–10.5)

## 2012-06-22 MED ORDER — FLUTICASONE-SALMETEROL 250-50 MCG/DOSE IN AEPB: 1.0000 | INHALATION_SPRAY | Freq: Two times a day (BID) | RESPIRATORY_TRACT | Status: DC

## 2012-06-22 MED ORDER — HYDROCODONE-ACETAMINOPHEN 7.5-500 MG/15ML PO SOLN
10.0000 mL | Freq: Once | ORAL | Status: AC
  Administered 2012-06-22: 10 mL via ORAL
  Filled 2012-06-22: qty 15

## 2012-06-22 MED ORDER — PREDNISONE 20 MG PO TABS
60.0000 mg | ORAL_TABLET | Freq: Once | ORAL | Status: AC
  Administered 2012-06-22: 60 mg via ORAL
  Filled 2012-06-22: qty 3

## 2012-06-22 MED ORDER — AZITHROMYCIN 250 MG PO TABS: 250.0000 mg | ORAL_TABLET | Freq: Every day | ORAL | Status: DC

## 2012-06-22 MED ORDER — TIOTROPIUM BROMIDE MONOHYDRATE 18 MCG IN CAPS: 18.0000 ug | ORAL_CAPSULE | Freq: Every day | RESPIRATORY_TRACT | Status: DC

## 2012-06-22 MED ORDER — IPRATROPIUM BROMIDE 0.02 % IN SOLN
0.5000 mg | Freq: Once | RESPIRATORY_TRACT | Status: AC
  Administered 2012-06-22: 0.5 mg via RESPIRATORY_TRACT
  Filled 2012-06-22: qty 2.5

## 2012-06-22 MED ORDER — ALBUTEROL SULFATE (5 MG/ML) 0.5% IN NEBU
INHALATION_SOLUTION | RESPIRATORY_TRACT | Status: AC
  Filled 2012-06-22: qty 1

## 2012-06-22 MED ORDER — PREDNISONE 10 MG PO TABS: 20.0000 mg | ORAL_TABLET | Freq: Every day | ORAL | Status: DC

## 2012-06-22 MED ORDER — ALBUTEROL SULFATE HFA 108 (90 BASE) MCG/ACT IN AERS
2.0000 | INHALATION_SPRAY | Freq: Once | RESPIRATORY_TRACT | Status: AC
  Administered 2012-06-22: 2 via RESPIRATORY_TRACT
  Filled 2012-06-22: qty 6.7

## 2012-06-22 MED ORDER — ALBUTEROL SULFATE HFA 108 (90 BASE) MCG/ACT IN AERS
INHALATION_SPRAY | RESPIRATORY_TRACT | Status: AC
  Filled 2012-06-22: qty 6.7

## 2012-06-22 MED ORDER — ALBUTEROL (5 MG/ML) CONTINUOUS INHALATION SOLN
5.0000 mg/h | INHALATION_SOLUTION | Freq: Once | RESPIRATORY_TRACT | Status: AC
  Administered 2012-06-22: 5 mg/h via RESPIRATORY_TRACT

## 2012-06-22 MED ORDER — DIAZEPAM 5 MG PO TABS: 5.0000 mg | ORAL_TABLET | Freq: Two times a day (BID) | ORAL | Status: DC

## 2012-06-22 MED ORDER — IPRATROPIUM-ALBUTEROL 20-100 MCG/ACT IN AERS: 1.0000 | INHALATION_SPRAY | Freq: Four times a day (QID) | RESPIRATORY_TRACT | Status: DC

## 2012-06-22 MED ORDER — ALBUTEROL (5 MG/ML) CONTINUOUS INHALATION SOLN
10.0000 mg/h | INHALATION_SOLUTION | Freq: Once | RESPIRATORY_TRACT | Status: AC
  Administered 2012-06-22: 10 mg/h via RESPIRATORY_TRACT

## 2012-06-22 MED ORDER — ALBUTEROL SULFATE (5 MG/ML) 0.5% IN NEBU
5.0000 mg | INHALATION_SOLUTION | Freq: Once | RESPIRATORY_TRACT | Status: AC
  Administered 2012-06-22: 5 mg via RESPIRATORY_TRACT
  Filled 2012-06-22: qty 0.5

## 2012-06-22 NOTE — ED Provider Notes (Signed)
Medical screening examination/treatment/procedure(s) were performed by non-physician practitioner and as supervising physician I was immediately available for consultation/collaboration.  Cyncere Ruhe K Linker, MD 06/22/12 1414 

## 2012-06-22 NOTE — ED Notes (Signed)
Pt c/o cough and cold sx since yesterday.

## 2012-06-22 NOTE — ED Provider Notes (Signed)
History     CSN: [id number]  Arrival date & time 06/22/12  4098   First MD Initiated Contact with Patient 06/22/12 0957      Chief Complaint  Patient presents with  . Cough    (Consider location/radiation/quality/duration/timing/severity/associated sxs/prior treatment) HPI  46 year old female with hx of asthma, and copd presents with flu-like sxs.  Pt reports for the past 2 days she developed gradual onset of headache, ear pain, sore throat, sneeze, nasal congestion, non productive cough and body aches.  Sxs is moderate in severity, persistent, worsen.  Having difficulty catching her breath with pleuritic chest pain.  Denies fever, chills, n/v/d, abd pain or rash.  Has ran out of inhaler for more than 2 months.  No prior hx of intubation or ICU stay.    Past Medical History  Diagnosis Date  . Asthma   . COPD (chronic obstructive pulmonary disease)   . Depression   . Anxiety     History reviewed. No pertinent past surgical history.  History reviewed. No pertinent family history.  History  Substance Use Topics  . Smoking status: Current Every Day Smoker  . Smokeless tobacco: Not on file  . Alcohol Use:     OB History    Grav Para Term Preterm Abortions TAB SAB Ect Mult Living                  Review of Systems  Constitutional: Negative for fever and chills.  HENT: Positive for ear pain, congestion, sore throat, rhinorrhea, sneezing and neck pain. Negative for drooling, trouble swallowing and voice change.   Respiratory: Positive for shortness of breath.   Cardiovascular: Positive for chest pain.  Gastrointestinal: Negative for abdominal pain.  Skin: Negative for rash.  Neurological: Negative for numbness.    Allergies  Review of patient's allergies indicates no known allergies.  Home Medications   Current Outpatient Rx  Name  Route  Sig  Dispense  Refill  . IPRATROPIUM-ALBUTEROL 18-103 MCG/ACT IN AERO   Inhalation   Inhale 2 puffs into the lungs every 6  (six) hours as needed.         Marland Kitchen AMITRIPTYLINE HCL 10 MG PO TABS   Oral   Take 1 tablet (10 mg total) by mouth at bedtime.   10 tablet   0   . BUSPIRONE HCL 10 MG PO TABS   Oral   Take 1 tablet (10 mg total) by mouth 3 (three) times daily.   30 tablet   0   . HYDROXYZINE HCL 25 MG PO TABS   Oral   Take 1 tablet (25 mg total) by mouth once.   30 tablet   0   . IPRATROPIUM-ALBUTEROL 20-100 MCG/ACT IN AERS   Inhalation   Inhale 1 puff into the lungs every 6 (six) hours.   1 Inhaler   0   . PREDNISONE (PAK) 5 MG PO TABS   Oral   Take by mouth.         Marland Kitchen TIOTROPIUM BROMIDE MONOHYDRATE 18 MCG IN CAPS   Inhalation   Place 18 mcg into inhaler and inhale daily.           BP 140/89  Pulse 121  Temp 99.1 F (37.3 C) (Oral)  Resp 22  SpO2 96%  Physical Exam  Nursing note and vitals reviewed. Constitutional: She appears well-developed and well-nourished. No distress.       Awake, alert, nontoxic appearance  HENT:  Head: Atraumatic.  Bilateral TM mildly erythematous and dull.    Mild rhinorrhea  Bilateral tonsillar enlargement with mild exudates, uvula midline.  No evidence of deep tissue infection.    Eyes: Conjunctivae normal are normal. Right eye exhibits no discharge. Left eye exhibits no discharge.  Neck: Neck supple.  Cardiovascular: Normal rate and regular rhythm.   Pulmonary/Chest: Effort normal. No respiratory distress (speaking in complete sentences). She has wheezes (minimal scattered wheezes and rhonchi). She exhibits no tenderness.       No accessory muscle use  Abdominal: Soft. There is no tenderness. There is no rebound.  Musculoskeletal: She exhibits no tenderness.       ROM appears intact, no obvious focal weakness  Neurological:       Mental status and motor strength appears intact  Skin: No rash noted.  Psychiatric: She has a normal mood and affect.    ED Course  Procedures (including critical care time)   Labs Reviewed  RAPID  STREP SCREEN   Results for orders placed during the hospital encounter of 06/22/12  RAPID STREP SCREEN      Component Value Range   Streptococcus, Group A Screen (Direct) NEGATIVE  NEGATIVE  CBC WITH DIFFERENTIAL      Component Value Range   WBC 6.0  4.0 - 10.5 K/uL   RBC 4.48  3.87 - 5.11 MIL/uL   Hemoglobin 14.0  12.0 - 15.0 g/dL   HCT 40.9  81.1 - 91.4 %   MCV 95.5  78.0 - 100.0 fL   MCH 31.3  26.0 - 34.0 pg   MCHC 32.7  30.0 - 36.0 g/dL   RDW 78.2  95.6 - 21.3 %   Platelets 203  150 - 400 K/uL   Neutrophils Relative 88 (*) 43 - 77 %   Neutro Abs 5.3  1.7 - 7.7 K/uL   Lymphocytes Relative 9 (*) 12 - 46 %   Lymphs Abs 0.5 (*) 0.7 - 4.0 K/uL   Monocytes Relative 3  3 - 12 %   Monocytes Absolute 0.2  0.1 - 1.0 K/uL   Eosinophils Relative 0  0 - 5 %   Eosinophils Absolute 0.0  0.0 - 0.7 K/uL   Basophils Relative 1  0 - 1 %   Basophils Absolute 0.0  0.0 - 0.1 K/uL   Dg Chest 2 View  06/22/2012  *RADIOLOGY REPORT*  Clinical Data: Cough and shortness of breath  CHEST - 2 VIEW  Comparison: February 01, 2011  Findings: There is no edema or consolidation.  Heart size and pulmonary vascularity are normal.  No adenopathy.  There is mid thoracic dextroscoliosis.  IMPRESSION: No edema or consolidation.   Original Report Authenticated By: Bretta Bang, M.D.      1. URI 2. asthma  MDM  Pt with hx of COPD and asthma presents with flu-like sxs.  Is tachycardic and tachypneic in mild resp distress.  Will give albuterol/atrovent treatment, cxr, rapid strep test.  Cough medication given.  Prednisone given.    11:37 AM Rapid strep test is negative and CXR result reviewed by me is unremarkable.    12:17 PM Heart rates improved, however pt sats at 89% on RA.  She does not have any significant risk factors concerning for PE.  Will start continuous nebs. Will continue to monitor.    1:18 PM Pt continues to sats around 90-92 on 2L after receiving 2 nebs treatment.  Will start continuous nebs  x 1 hr.    1:57 PM After  continuous nebs pt felt better.  Sats @ 95% on RA.  Mildly tachy likely secondary to albuterol use.  Will d/c with meds refill, prednisone and zithromax.  Strict return precaution.  Pt voice understanding and agrees with plan.  Smoking cessation discussed.   I have reviewed nursing notes and vital signs. I personally reviewed the imaging tests through PACS system  I reviewed available ER/hospitalization records thought the EMR    Fayrene Helper, PA-C 06/22/12 1359

## 2013-04-15 ENCOUNTER — Encounter (HOSPITAL_COMMUNITY): Payer: Self-pay | Admitting: Emergency Medicine

## 2013-04-15 ENCOUNTER — Emergency Department (HOSPITAL_COMMUNITY): Payer: Medicaid Other

## 2013-04-15 ENCOUNTER — Emergency Department (HOSPITAL_COMMUNITY)
Admission: EM | Admit: 2013-04-15 | Discharge: 2013-04-15 | Disposition: A | Discharge status: Home / Self Care | Payer: Medicaid Other | Attending: Emergency Medicine | Admitting: Emergency Medicine

## 2013-04-15 DIAGNOSIS — F3289 Other specified depressive episodes: Secondary | ICD-10-CM | POA: Insufficient documentation

## 2013-04-15 DIAGNOSIS — Z79899 Other long term (current) drug therapy: Secondary | ICD-10-CM | POA: Insufficient documentation

## 2013-04-15 DIAGNOSIS — R296 Repeated falls: Secondary | ICD-10-CM | POA: Insufficient documentation

## 2013-04-15 DIAGNOSIS — J449 Chronic obstructive pulmonary disease, unspecified: Secondary | ICD-10-CM

## 2013-04-15 DIAGNOSIS — Y9301 Activity, walking, marching and hiking: Secondary | ICD-10-CM | POA: Insufficient documentation

## 2013-04-15 DIAGNOSIS — Y929 Unspecified place or not applicable: Secondary | ICD-10-CM | POA: Insufficient documentation

## 2013-04-15 DIAGNOSIS — S93401A Sprain of unspecified ligament of right ankle, initial encounter: Secondary | ICD-10-CM

## 2013-04-15 DIAGNOSIS — IMO0002 Reserved for concepts with insufficient information to code with codable children: Secondary | ICD-10-CM | POA: Insufficient documentation

## 2013-04-15 DIAGNOSIS — S93409A Sprain of unspecified ligament of unspecified ankle, initial encounter: Secondary | ICD-10-CM | POA: Insufficient documentation

## 2013-04-15 DIAGNOSIS — F329 Major depressive disorder, single episode, unspecified: Secondary | ICD-10-CM | POA: Insufficient documentation

## 2013-04-15 DIAGNOSIS — Z792 Long term (current) use of antibiotics: Secondary | ICD-10-CM | POA: Insufficient documentation

## 2013-04-15 DIAGNOSIS — S20219A Contusion of unspecified front wall of thorax, initial encounter: Secondary | ICD-10-CM

## 2013-04-15 DIAGNOSIS — F17200 Nicotine dependence, unspecified, uncomplicated: Secondary | ICD-10-CM | POA: Insufficient documentation

## 2013-04-15 DIAGNOSIS — J441 Chronic obstructive pulmonary disease with (acute) exacerbation: Secondary | ICD-10-CM | POA: Insufficient documentation

## 2013-04-15 DIAGNOSIS — F411 Generalized anxiety disorder: Secondary | ICD-10-CM | POA: Insufficient documentation

## 2013-04-15 LAB — CBC WITH DIFFERENTIAL/PLATELET
Basophils Absolute: 0.1 10*3/uL (ref 0.0–0.1)
Basophils Relative: 1 % (ref 0–1)
Eosinophils Relative: 5 % (ref 0–5)
HCT: 46.4 % — ABNORMAL HIGH (ref 36.0–46.0)
Lymphs Abs: 4.6 10*3/uL — ABNORMAL HIGH (ref 0.7–4.0)
MCHC: 34.7 g/dL (ref 30.0–36.0)
MCV: 94.9 fL (ref 78.0–100.0)
Monocytes Absolute: 0.9 10*3/uL (ref 0.1–1.0)
Monocytes Relative: 9 % (ref 3–12)
Neutro Abs: 4.2 10*3/uL (ref 1.7–7.7)
Neutrophils Relative %: 41 % — ABNORMAL LOW (ref 43–77)
RDW: 12.9 % (ref 11.5–15.5)

## 2013-04-15 LAB — COMPREHENSIVE METABOLIC PANEL
AST: 34 U/L (ref 0–37)
Albumin: 4.1 g/dL (ref 3.5–5.2)
BUN: 10 mg/dL (ref 6–23)
CO2: 27 mEq/L (ref 19–32)
Calcium: 9.5 mg/dL (ref 8.4–10.5)
Chloride: 99 mEq/L (ref 96–112)
Creatinine, Ser: 1.26 mg/dL — ABNORMAL HIGH (ref 0.50–1.10)
GFR calc non Af Amer: 50 mL/min — ABNORMAL LOW (ref >90)
Glucose, Bld: 99 mg/dL (ref 70–99)

## 2013-04-15 LAB — PRO B NATRIURETIC PEPTIDE: Pro B Natriuretic peptide (BNP): 248.9 pg/mL — ABNORMAL HIGH (ref 0–125)

## 2013-04-15 LAB — POCT I-STAT TROPONIN I: Troponin i, poc: 0 ng/mL (ref 0.00–0.08)

## 2013-04-15 MED ORDER — TRAMADOL HCL 50 MG PO TABS: 50.0000 mg | ORAL_TABLET | Freq: Four times a day (QID) | ORAL | Status: DC | PRN

## 2013-04-15 MED ORDER — ALBUTEROL SULFATE HFA 108 (90 BASE) MCG/ACT IN AERS: 2.0000 | INHALATION_SPRAY | Freq: Four times a day (QID) | RESPIRATORY_TRACT | Status: DC | PRN

## 2013-04-15 MED ORDER — AMITRIPTYLINE HCL 10 MG PO TABS: 10.0000 mg | ORAL_TABLET | Freq: Every day | ORAL | Status: DC

## 2013-04-15 MED ORDER — ALBUTEROL SULFATE (5 MG/ML) 0.5% IN NEBU
5.0000 mg | INHALATION_SOLUTION | Freq: Once | RESPIRATORY_TRACT | Status: AC
  Administered 2013-04-15: 5 mg via RESPIRATORY_TRACT
  Filled 2013-04-15: qty 1

## 2013-04-15 MED ORDER — IPRATROPIUM-ALBUTEROL 20-100 MCG/ACT IN AERS: 1.0000 | INHALATION_SPRAY | Freq: Four times a day (QID) | RESPIRATORY_TRACT | Status: DC

## 2013-04-15 MED ORDER — HYDROCODONE-ACETAMINOPHEN 5-325 MG PO TABS
1.0000 | ORAL_TABLET | Freq: Once | ORAL | Status: AC
  Administered 2013-04-15: 1 via ORAL
  Filled 2013-04-15: qty 1

## 2013-04-15 MED ORDER — FLUTICASONE-SALMETEROL 250-50 MCG/DOSE IN AEPB: 1.0000 | INHALATION_SPRAY | Freq: Two times a day (BID) | RESPIRATORY_TRACT | Status: DC

## 2013-04-15 MED ORDER — TIOTROPIUM BROMIDE MONOHYDRATE 18 MCG IN CAPS: 18.0000 ug | ORAL_CAPSULE | Freq: Every day | RESPIRATORY_TRACT | Status: DC

## 2013-04-15 MED ORDER — IPRATROPIUM BROMIDE 0.02 % IN SOLN
0.5000 mg | Freq: Once | RESPIRATORY_TRACT | Status: AC
  Administered 2013-04-15: 0.5 mg via RESPIRATORY_TRACT
  Filled 2013-04-15: qty 2.5

## 2013-04-15 MED ORDER — HYDROXYZINE HCL 25 MG PO TABS: 50.0000 mg | ORAL_TABLET | Freq: Three times a day (TID) | ORAL | Status: DC

## 2013-04-15 MED ORDER — QUETIAPINE FUMARATE ER 50 MG PO TB24: 150.0000 mg | ORAL_TABLET | Freq: Every day | ORAL | Status: DC

## 2013-04-15 NOTE — ED Notes (Signed)
Pt c/o rib cage pain x2 weeks.  Reports falling onto rail and landing on chest x2 weeks. Reports pain has increased since then.  Pt also has hx of COPD and reports being out of inhaler medications.  Reports not being able to be seen by PCP because they will not see pt without medicare card.

## 2013-04-15 NOTE — ED Provider Notes (Signed)
MSE was initiated and I personally evaluated the patient and placed orders (if any) at  5:12 PM on April 15, 2013.  Charlotte Fisher is a 47 year old female with past medical history of asthma, COPD, depression, anxiety presenting to emergency department with repeat pain has been ongoing for the past 2 weeks. As per patient reported that she tripped and fell going down the stairs landing on her right leg towards the Center for chest. Patient reports that the pain feels like there is a knot in the Center for chest, reports pain upon palpation to the Center for chest described as a nagging discomfort that worsens when she coughs. Patient reports she's been out of her inhalers for the past couple of weeks, reported that she does percent refill couple of days ago. Patient reports that she's been having shortness of breath and difficulty breathing due to her asthma discomfort. Patient reports she's been experiencing chest pain, not associated with the trauma to the Center the chest. Patient reports that she's been experiencing heart fluttering, fast heart rate intermittently throughout the day, sporadically. Patient reports that she's been feeling weak. Reports that motion makes the pain worse. Reported that she's been using ibuprofen with negative relief. Denied dizziness, weakness, fainting, numbness, tingling, blurred vision, fever, chills. Alert and oriented. Negative ecchymosis noted to the Center the chest, negative lesions. Small bulge to Center of chest, soft and painful upon palpation. Negative crepitus identified. Expiratory and inspiratory wheezes identified bilaterally to upper and lower lobes. Pulses palpable and strong bilaterally and distally. Heart rate and rhythm appears normal upon auscultation. Patient appears anxious. Patient tachycardic with elevated blood pressure identified. 98% on room air.  Patient given albuterol treatment.   The patient appears stable so that the remainder of the MSE may be  completed by another provider.  Raymon Mutton, PA-C 04/15/13 2221

## 2013-04-15 NOTE — ED Notes (Signed)
RT called for neb tx.

## 2013-04-15 NOTE — Progress Notes (Signed)
Patient confirms she does not have a pcp but she does have Medicaid insurance.  [facility] provided patient with a list of Medicaid accepting physicians in [facility].

## 2013-04-15 NOTE — ED Notes (Signed)
Pt now c/o of heart racing and fluttering.

## 2013-04-15 NOTE — ED Provider Notes (Addendum)
CSN: [id number]     Arrival date & time 04/15/13  1526 History   First MD Initiated Contact with Patient 04/15/13 1542     Chief Complaint  Patient presents with  . Rib Cage Pain     HPI Pt fell two weeks ago walking down the steps landing on on railing and hurting her ribs.  Pt states the pain has never gone away.  She is concerned that something might be broken because it has not gotten any better.    The pain is in the front of her chest and she feels a knot in that area.  She has felt a little short of breath.  She also has a little bit of pain in her right ankle and lower leg.  She has been able to walk.  Pt also has copd and has not been taking her medications.  She continues to smoke. Past Medical History  Diagnosis Date  . Asthma   . COPD (chronic obstructive pulmonary disease)   . Depression   . Anxiety    History reviewed. No pertinent past surgical history. History reviewed. No pertinent family history. History  Substance Use Topics  . Smoking status: Current Every Day Smoker  . Smokeless tobacco: Not on file  . Alcohol Use:    OB History   Grav Para Term Preterm Abortions TAB SAB Ect Mult Living                 Review of Systems  All other systems reviewed and are negative.    Allergies  Review of patient's allergies indicates no known allergies.  Home Medications   Current Outpatient Rx  Name  Route  Sig  Dispense  Refill  . ibuprofen (ADVIL,MOTRIN) 200 MG tablet   Oral   Take 400 mg by mouth every 6 (six) hours as needed for pain.         Marland Kitchen albuterol (PROVENTIL HFA;VENTOLIN HFA) 108 (90 BASE) MCG/ACT inhaler   Inhalation   Inhale 2 puffs into the lungs every 6 (six) hours as needed for wheezing.   1 Inhaler   1   . amitriptyline (ELAVIL) 10 MG tablet   Oral   Take 10 mg by mouth at bedtime.         Marland Kitchen azithromycin (ZITHROMAX) 250 MG tablet   Oral   Take 1 tablet (250 mg total) by mouth daily.   4 tablet   0   . diazepam (VALIUM) 5 MG  tablet   Oral   Take 1 tablet (5 mg total) by mouth 2 (two) times daily.   10 tablet   0   . Fluticasone-Salmeterol (ADVAIR DISKUS) 250-50 MCG/DOSE AEPB   Inhalation   Inhale 1 puff into the lungs 2 (two) times daily.   60 each   1   . hydrOXYzine (ATARAX/VISTARIL) 25 MG tablet   Oral   Take 50 mg by mouth 3 (three) times daily.         . Ipratropium-Albuterol (COMBIVENT RESPIMAT) 20-100 MCG/ACT AERS respimat   Inhalation   Inhale 1 puff into the lungs every 6 (six) hours.   1 Inhaler   1   . QUEtiapine (SEROQUEL XR) 50 MG TB24   Oral   Take 150 mg by mouth at bedtime.         Marland Kitchen tiotropium (SPIRIVA HANDIHALER) 18 MCG inhalation capsule   Inhalation   Place 1 capsule (18 mcg total) into inhaler and inhale daily.   30 capsule  12   . traMADol (ULTRAM) 50 MG tablet   Oral   Take 1 tablet (50 mg total) by mouth every 6 (six) hours as needed for pain.   21 tablet   0    BP 140/86  Pulse 109  Temp(Src) 98.9 F (37.2 C) (Oral)  Resp 18  SpO2 96% Physical Exam  Nursing note and vitals reviewed. Constitutional: She appears well-developed and well-nourished. No distress.  HENT:  Head: Normocephalic and atraumatic.  Right Ear: External ear normal.  Left Ear: External ear normal.  Eyes: Conjunctivae are normal. Right eye exhibits no discharge. Left eye exhibits no discharge. No scleral icterus.  Neck: Neck supple. No tracheal deviation present.  Cardiovascular: Normal rate, regular rhythm and intact distal pulses.   Pulmonary/Chest: Effort normal. No stridor. No respiratory distress. She has wheezes. She has no rales. She exhibits tenderness (no crepitus).  Abdominal: Soft. Bowel sounds are normal. She exhibits no distension. There is no tenderness. There is no rebound and no guarding.  Musculoskeletal: She exhibits no edema.       Right ankle: She exhibits normal range of motion, no swelling and no ecchymosis. Tenderness. Lateral malleolus and medial malleolus  tenderness found.       Right lower leg: She exhibits bony tenderness. She exhibits no swelling, no edema and no deformity.  Neurological: She is alert. She has normal strength. No sensory deficit. Cranial nerve deficit:  no gross defecits noted. She exhibits normal muscle tone. She displays no seizure activity. Coordination normal.  Skin: Skin is warm and dry. No rash noted.  Psychiatric: She has a normal mood and affect.    ED Course  Procedures (including critical care time) Labs Review Labs Reviewed  CBC WITH DIFFERENTIAL - Abnormal; Notable for the following:    Hemoglobin 16.1 (*)    HCT 46.4 (*)    Neutrophils Relative % 41 (*)    Lymphs Abs 4.6 (*)    All other components within normal limits  COMPREHENSIVE METABOLIC PANEL - Abnormal; Notable for the following:    Potassium 3.0 (*)    Creatinine, Ser 1.26 (*)    GFR calc non Af Amer 50 (*)    GFR calc Af Amer 58 (*)    All other components within normal limits  PRO B NATRIURETIC PEPTIDE - Abnormal; Notable for the following:    Pro B Natriuretic peptide (BNP) 248.9 (*)    All other components within normal limits  POCT I-STAT TROPONIN I   Imaging Review Dg Sternum  04/15/2013   CLINICAL DATA:  Initial encounter for mid sternal chest pain which began approximately 2 weeks ago related to a fall.  EXAM: STERNUM - 2+ VIEW  COMPARISON:  No prior sternal imaging. Two-view chest x-ray 06/22/2012, 01/29/2011.  FINDINGS: No fractures involving the sternum. No evidence of retrosternal hematoma. No significant degenerative changes at the junction of the manubrium and body.  Included PA chest demonstrates a normal cardiomediastinal silhouette, unchanged. Lungs clear. Bronchovascular markings normal. Pulmonary vascularity normal. No visible pleural effusions. No pneumothorax. Slight upper thoracic scoliosis convex left and compensatory lower thoracic scoliosis convex right again noted and unchanged.  IMPRESSION: 1. No sternal fracture  identified. 2. No acute cardiopulmonary disease.   Electronically Signed   By: Hulan Saas M.D.   On: 04/15/2013 17:06   Dg Tibia/fibula Right  04/15/2013   CLINICAL DATA:  Larey Seat. Right leg pain.  EXAM: RIGHT TIBIA AND FIBULA - 2 VIEW  COMPARISON:  None.  FINDINGS:  The E knee and ankle joints are maintained. No acute fracture of the tibia or fibula is identified.  IMPRESSION: No acute bony findings.   Electronically Signed   By: Loralie Champagne M.D.   On: 04/15/2013 18:02   Dg Ankle Complete Right  04/15/2013   CLINICAL DATA:  Status post fall 2 weeks ago. Diffuse ankle pain.  EXAM: RIGHT ANKLE - COMPLETE 3+ VIEW  COMPARISON:  Plain films 06/09/2006. Marland Kitchen  FINDINGS: No acute fracture is identified. Remote calcaneal fracture is again seen and appears unchanged. Cortical irregularity along the lateral malleolus is unchanged and may also be due to remote fracture. Soft tissue swelling laterally is noted.  IMPRESSION: Lateral soft tissue swelling without acute bony or joint abnormality.  Remote calcaneal fracture. There may also be a remote fracture of the distal fibula.   Electronically Signed   By: Drusilla Kanner M.D.   On: 04/15/2013 18:06    EKG Interpretation     Ventricular Rate:  111 PR Interval:  326 QRS Duration: 161 QT Interval:  407 QTC Calculation: 554 R Axis:   -99 Text Interpretation:  Sinus tachycardia Atrial premature complexes Prolonged PR interval Prominent P waves, nondiagnostic Right bundle branch block suspected limb lead reversal on prior EKG resolved.           EKG Interpretation     Ventricular Rate:  111 PR Interval:  326 QRS Duration: 161 QT Interval:  407 QTC Calculation: 554 R Axis:   -99 Text Interpretation:  Sinus tachycardia Atrial premature complexes Prolonged PR interval Prominent P waves, nondiagnostic Right bundle branch block suspected limb lead reversal on prior EKG resolved.            MDM   1. Ankle sprain, right, initial  encounter   2. Chest wall contusion, unspecified laterality, initial encounter   3. COPD (chronic obstructive pulmonary disease)    The patient's x-rays do not show rib fractures, sternal fracture, or lower extremity fractures. Her symptoms most likely related to soft tissue injury.  Patient was given albuterol treatment. I recommend that she stop smoking. I will give her a refill of her albuterol given a prescription for Ultram for her pain    Celene Kras, MD 04/15/13 1912  Celene Kras, MD 04/15/13 [id number]

## 2013-04-15 NOTE — ED Notes (Signed)
Pt states that she is having worsening chest pain. Dr. Lynelle Doctor aware.

## 2013-04-16 NOTE — ED Provider Notes (Signed)
Medical screening examination/treatment/procedure(s) were performed by non-physician practitioner and as supervising physician I was immediately available for consultation/collaboration.     Arletta Lumadue R Feliz Lincoln, MD 04/16/13 1604 

## 2013-05-24 ENCOUNTER — Encounter (HOSPITAL_COMMUNITY): Payer: Self-pay | Admitting: Emergency Medicine

## 2013-05-24 ENCOUNTER — Inpatient Hospital Stay (HOSPITAL_COMMUNITY)
Admission: EM | Admit: 2013-05-24 | Discharge: 2013-05-25 | DRG: 392 | Disposition: A | Discharge status: Home / Self Care | Payer: Medicaid Other | Attending: Internal Medicine | Admitting: Internal Medicine

## 2013-05-24 ENCOUNTER — Emergency Department (HOSPITAL_COMMUNITY): Payer: Medicaid Other

## 2013-05-24 DIAGNOSIS — F411 Generalized anxiety disorder: Secondary | ICD-10-CM | POA: Diagnosis present

## 2013-05-24 DIAGNOSIS — F1020 Alcohol dependence, uncomplicated: Secondary | ICD-10-CM | POA: Diagnosis present

## 2013-05-24 DIAGNOSIS — T3995XA Adverse effect of unspecified nonopioid analgesic, antipyretic and antirheumatic, initial encounter: Secondary | ICD-10-CM | POA: Diagnosis present

## 2013-05-24 DIAGNOSIS — F17200 Nicotine dependence, unspecified, uncomplicated: Secondary | ICD-10-CM | POA: Diagnosis present

## 2013-05-24 DIAGNOSIS — Z823 Family history of stroke: Secondary | ICD-10-CM

## 2013-05-24 DIAGNOSIS — J4489 Other specified chronic obstructive pulmonary disease: Secondary | ICD-10-CM | POA: Diagnosis present

## 2013-05-24 DIAGNOSIS — R109 Unspecified abdominal pain: Secondary | ICD-10-CM

## 2013-05-24 DIAGNOSIS — R079 Chest pain, unspecified: Secondary | ICD-10-CM | POA: Diagnosis present

## 2013-05-24 DIAGNOSIS — F319 Bipolar disorder, unspecified: Secondary | ICD-10-CM | POA: Diagnosis present

## 2013-05-24 DIAGNOSIS — E669 Obesity, unspecified: Secondary | ICD-10-CM | POA: Diagnosis present

## 2013-05-24 DIAGNOSIS — R7989 Other specified abnormal findings of blood chemistry: Secondary | ICD-10-CM | POA: Diagnosis present

## 2013-05-24 DIAGNOSIS — K2900 Acute gastritis without bleeding: Principal | ICD-10-CM | POA: Diagnosis present

## 2013-05-24 DIAGNOSIS — J449 Chronic obstructive pulmonary disease, unspecified: Secondary | ICD-10-CM | POA: Diagnosis present

## 2013-05-24 DIAGNOSIS — R748 Abnormal levels of other serum enzymes: Secondary | ICD-10-CM | POA: Diagnosis present

## 2013-05-24 DIAGNOSIS — Z791 Long term (current) use of non-steroidal anti-inflammatories (NSAID): Secondary | ICD-10-CM

## 2013-05-24 DIAGNOSIS — I10 Essential (primary) hypertension: Secondary | ICD-10-CM | POA: Diagnosis present

## 2013-05-24 DIAGNOSIS — K319 Disease of stomach and duodenum, unspecified: Secondary | ICD-10-CM | POA: Diagnosis present

## 2013-05-24 DIAGNOSIS — Z79899 Other long term (current) drug therapy: Secondary | ICD-10-CM

## 2013-05-24 DIAGNOSIS — R1013 Epigastric pain: Secondary | ICD-10-CM | POA: Diagnosis present

## 2013-05-24 DIAGNOSIS — I369 Nonrheumatic tricuspid valve disorder, unspecified: Secondary | ICD-10-CM

## 2013-05-24 LAB — COMPREHENSIVE METABOLIC PANEL
ALT: 30 U/L (ref 0–35)
AST: 28 U/L (ref 0–37)
Albumin: 3.5 g/dL (ref 3.5–5.2)
Alkaline Phosphatase: 86 U/L (ref 39–117)
Chloride: 103 mEq/L (ref 96–112)
Creatinine, Ser: 0.96 mg/dL (ref 0.50–1.10)
Potassium: 3.4 mEq/L — ABNORMAL LOW (ref 3.5–5.1)
Sodium: 137 mEq/L (ref 135–145)
Total Protein: 7.6 g/dL (ref 6.0–8.3)

## 2013-05-24 LAB — CBC WITH DIFFERENTIAL/PLATELET
Basophils Relative: 1 % (ref 0–1)
Eosinophils Absolute: 0.2 10*3/uL (ref 0.0–0.7)
Hemoglobin: 14.6 g/dL (ref 12.0–15.0)
Lymphocytes Relative: 28 % (ref 12–46)
MCH: 33.3 pg (ref 26.0–34.0)
MCHC: 34.6 g/dL (ref 30.0–36.0)
MCV: 96.3 fL (ref 78.0–100.0)
Monocytes Absolute: 0.8 10*3/uL (ref 0.1–1.0)
Monocytes Relative: 8 % (ref 3–12)
Neutro Abs: 5.8 10*3/uL (ref 1.7–7.7)
Neutrophils Relative %: 61 % (ref 43–77)
Platelets: 212 10*3/uL (ref 150–400)
RBC: 4.38 MIL/uL (ref 3.87–5.11)
WBC: 9.5 10*3/uL (ref 4.0–10.5)

## 2013-05-24 LAB — URINALYSIS, ROUTINE W REFLEX MICROSCOPIC
Bilirubin Urine: NEGATIVE
Glucose, UA: NEGATIVE mg/dL
Hgb urine dipstick: NEGATIVE
Nitrite: NEGATIVE
Specific Gravity, Urine: 1.017 (ref 1.005–1.030)
pH: 7.5 (ref 5.0–8.0)

## 2013-05-24 LAB — PREGNANCY, URINE: Preg Test, Ur: NEGATIVE

## 2013-05-24 LAB — ETHANOL: Alcohol, Ethyl (B): <11 mg/dL (ref 0–11)

## 2013-05-24 LAB — TROPONIN I
Troponin I: 0.77 ng/mL (ref <0.30)
Troponin I: 0.76 ng/mL (ref <0.30)
Troponin I: 0.68 ng/mL (ref <0.30)

## 2013-05-24 LAB — POCT I-STAT TROPONIN I: Troponin i, poc: 0 ng/mL (ref 0.00–0.08)

## 2013-05-24 MED ORDER — ONDANSETRON HCL 4 MG/2ML IJ SOLN: 4.0000 mg | Freq: Four times a day (QID) | INTRAMUSCULAR | Status: DC | PRN

## 2013-05-24 MED ORDER — MORPHINE SULFATE 2 MG/ML IJ SOLN
1.0000 mg | INTRAMUSCULAR | Status: DC | PRN
  Administered 2013-05-24 – 2013-05-25 (×3): 1 mg via INTRAVENOUS
  Filled 2013-05-24 (×4): qty 1

## 2013-05-24 MED ORDER — AMITRIPTYLINE HCL 10 MG PO TABS
10.0000 mg | ORAL_TABLET | Freq: Every day | ORAL | Status: DC
  Administered 2013-05-24: 22:00:00 10 mg via ORAL
  Filled 2013-05-24 (×2): qty 1

## 2013-05-24 MED ORDER — QUETIAPINE FUMARATE ER 50 MG PO TB24
150.0000 mg | ORAL_TABLET | Freq: Every day | ORAL | Status: DC
  Administered 2013-05-24: 22:00:00 150 mg via ORAL
  Filled 2013-05-24 (×2): qty 3

## 2013-05-24 MED ORDER — ALBUTEROL SULFATE (5 MG/ML) 0.5% IN NEBU: 2.5000 mg | INHALATION_SOLUTION | Freq: Four times a day (QID) | RESPIRATORY_TRACT | Status: DC

## 2013-05-24 MED ORDER — ALBUTEROL SULFATE (5 MG/ML) 0.5% IN NEBU: 2.5000 mg | INHALATION_SOLUTION | Freq: Four times a day (QID) | RESPIRATORY_TRACT | Status: DC | PRN

## 2013-05-24 MED ORDER — ASPIRIN EC 81 MG PO TBEC
81.0000 mg | DELAYED_RELEASE_TABLET | Freq: Every day | ORAL | Status: DC
  Filled 2013-05-24: qty 1

## 2013-05-24 MED ORDER — FOLIC ACID 1 MG PO TABS
1.0000 mg | ORAL_TABLET | Freq: Every day | ORAL | Status: DC
  Administered 2013-05-24 – 2013-05-25 (×2): 1 mg via ORAL
  Filled 2013-05-24 (×2): qty 1

## 2013-05-24 MED ORDER — SODIUM CHLORIDE 0.9 % IV SOLN
1000.0000 mL | Freq: Once | INTRAVENOUS | Status: AC
  Administered 2013-05-24: 1000 mL via INTRAVENOUS

## 2013-05-24 MED ORDER — NICOTINE 14 MG/24HR TD PT24
14.0000 mg | MEDICATED_PATCH | Freq: Every day | TRANSDERMAL | Status: DC
  Administered 2013-05-24 – 2013-05-25 (×2): 14 mg via TRANSDERMAL
  Filled 2013-05-24 (×2): qty 1

## 2013-05-24 MED ORDER — SODIUM CHLORIDE 0.9 % IJ SOLN: 3.0000 mL | Freq: Two times a day (BID) | INTRAMUSCULAR | Status: DC

## 2013-05-24 MED ORDER — ACETAMINOPHEN 325 MG PO TABS: 650.0000 mg | ORAL_TABLET | Freq: Four times a day (QID) | ORAL | Status: DC | PRN

## 2013-05-24 MED ORDER — THIAMINE HCL 100 MG/ML IJ SOLN
100.0000 mg | Freq: Every day | INTRAMUSCULAR | Status: DC
  Filled 2013-05-24 (×2): qty 1

## 2013-05-24 MED ORDER — LORAZEPAM 1 MG PO TABS
1.0000 mg | ORAL_TABLET | Freq: Four times a day (QID) | ORAL | Status: DC | PRN
  Filled 2013-05-24: qty 1

## 2013-05-24 MED ORDER — GI COCKTAIL ~~LOC~~
30.0000 mL | Freq: Once | ORAL | Status: AC
  Administered 2013-05-24: 30 mL via ORAL
  Filled 2013-05-24: qty 30

## 2013-05-24 MED ORDER — LORAZEPAM 1 MG PO TABS
0.0000 mg | ORAL_TABLET | Freq: Four times a day (QID) | ORAL | Status: DC
  Administered 2013-05-24 – 2013-05-25 (×3): 1 mg via ORAL
  Filled 2013-05-24 (×2): qty 1

## 2013-05-24 MED ORDER — NITROGLYCERIN 0.4 MG SL SUBL: 0.4000 mg | SUBLINGUAL_TABLET | SUBLINGUAL | Status: DC | PRN

## 2013-05-24 MED ORDER — HYDROMORPHONE HCL PF 1 MG/ML IJ SOLN
1.0000 mg | Freq: Once | INTRAMUSCULAR | Status: AC
  Administered 2013-05-24: 1 mg via INTRAVENOUS
  Filled 2013-05-24: qty 1

## 2013-05-24 MED ORDER — ASPIRIN EC 81 MG PO TBEC
81.0000 mg | DELAYED_RELEASE_TABLET | Freq: Every day | ORAL | Status: DC
  Administered 2013-05-25: 81 mg via ORAL
  Filled 2013-05-24: qty 1

## 2013-05-24 MED ORDER — ADULT MULTIVITAMIN W/MINERALS CH
1.0000 | ORAL_TABLET | Freq: Every day | ORAL | Status: DC
  Administered 2013-05-24 – 2013-05-25 (×2): 1 via ORAL
  Filled 2013-05-24 (×2): qty 1

## 2013-05-24 MED ORDER — SODIUM CHLORIDE 0.9 % IJ SOLN
3.0000 mL | Freq: Two times a day (BID) | INTRAMUSCULAR | Status: DC
  Administered 2013-05-24 – 2013-05-25 (×2): 3 mL via INTRAVENOUS

## 2013-05-24 MED ORDER — ENOXAPARIN SODIUM 100 MG/ML ~~LOC~~ SOLN
1.0000 mg/kg | Freq: Once | SUBCUTANEOUS | Status: AC
  Administered 2013-05-24: 80 mg via SUBCUTANEOUS
  Filled 2013-05-24: qty 1

## 2013-05-24 MED ORDER — LORAZEPAM 2 MG/ML IJ SOLN
1.0000 mg | Freq: Four times a day (QID) | INTRAMUSCULAR | Status: DC | PRN
  Administered 2013-05-24: 1 mg via INTRAVENOUS
  Filled 2013-05-24: qty 1

## 2013-05-24 MED ORDER — ENOXAPARIN SODIUM 40 MG/0.4ML ~~LOC~~ SOLN
40.0000 mg | SUBCUTANEOUS | Status: DC
  Filled 2013-05-24: qty 0.4

## 2013-05-24 MED ORDER — TIOTROPIUM BROMIDE MONOHYDRATE 18 MCG IN CAPS
18.0000 ug | ORAL_CAPSULE | Freq: Every day | RESPIRATORY_TRACT | Status: DC
  Administered 2013-05-25: 09:00:00 18 ug via RESPIRATORY_TRACT
  Filled 2013-05-24: qty 5

## 2013-05-24 MED ORDER — ONDANSETRON HCL 4 MG/2ML IJ SOLN
4.0000 mg | Freq: Once | INTRAMUSCULAR | Status: AC
  Administered 2013-05-24: 4 mg via INTRAVENOUS
  Filled 2013-05-24: qty 2

## 2013-05-24 MED ORDER — ASPIRIN EC 81 MG PO TBEC: 81.0000 mg | DELAYED_RELEASE_TABLET | Freq: Every day | ORAL | Status: DC

## 2013-05-24 MED ORDER — SODIUM CHLORIDE 0.9 % IV SOLN: 250.0000 mL | INTRAVENOUS | Status: DC | PRN

## 2013-05-24 MED ORDER — LORAZEPAM 1 MG PO TABS: 0.0000 mg | ORAL_TABLET | Freq: Two times a day (BID) | ORAL | Status: DC

## 2013-05-24 MED ORDER — ACETAMINOPHEN 650 MG RE SUPP: 650.0000 mg | Freq: Four times a day (QID) | RECTAL | Status: DC | PRN

## 2013-05-24 MED ORDER — ONDANSETRON HCL 4 MG PO TABS: 4.0000 mg | ORAL_TABLET | Freq: Four times a day (QID) | ORAL | Status: DC | PRN

## 2013-05-24 MED ORDER — PANTOPRAZOLE SODIUM 40 MG PO TBEC
40.0000 mg | DELAYED_RELEASE_TABLET | Freq: Every day | ORAL | Status: DC
  Administered 2013-05-24 – 2013-05-25 (×2): 40 mg via ORAL
  Filled 2013-05-24 (×2): qty 1

## 2013-05-24 MED ORDER — VITAMIN B-1 100 MG PO TABS
100.0000 mg | ORAL_TABLET | Freq: Every day | ORAL | Status: DC
  Administered 2013-05-24 – 2013-05-25 (×2): 100 mg via ORAL
  Filled 2013-05-24 (×2): qty 1

## 2013-05-24 MED ORDER — OXYCODONE HCL 5 MG PO TABS
5.0000 mg | ORAL_TABLET | Freq: Four times a day (QID) | ORAL | Status: DC | PRN
  Administered 2013-05-25: 5 mg via ORAL
  Filled 2013-05-24: qty 1

## 2013-05-24 MED ORDER — ASPIRIN 325 MG PO TABS
325.0000 mg | ORAL_TABLET | Freq: Once | ORAL | Status: AC
  Administered 2013-05-24: 325 mg via ORAL
  Filled 2013-05-24: qty 1

## 2013-05-24 MED ORDER — SODIUM CHLORIDE 0.9 % IV SOLN
1000.0000 mL | INTRAVENOUS | Status: DC
  Administered 2013-05-24: 1000 mL via INTRAVENOUS

## 2013-05-24 MED ORDER — LORAZEPAM 2 MG/ML IJ SOLN
1.0000 mg | Freq: Once | INTRAMUSCULAR | Status: AC
  Administered 2013-05-24: 1 mg via INTRAVENOUS
  Filled 2013-05-24: qty 1

## 2013-05-24 MED ORDER — METOPROLOL TARTRATE 12.5 MG HALF TABLET
12.5000 mg | ORAL_TABLET | Freq: Two times a day (BID) | ORAL | Status: DC
  Administered 2013-05-24: 12.5 mg via ORAL
  Filled 2013-05-24 (×3): qty 1

## 2013-05-24 MED ORDER — SODIUM CHLORIDE 0.9 % IJ SOLN: 3.0000 mL | INTRAMUSCULAR | Status: DC | PRN

## 2013-05-24 NOTE — ED Notes (Signed)
Dr Patria Mane notified of Troponin 0.77

## 2013-05-24 NOTE — ED Notes (Signed)
Pt c/o abd pain that radiates to her back, n/v and abd distention. Pt states that she drinks a lot of alcohol everyday for twenty years and last drink was at 2 am this morning. Pt also has a "boken left arm that they cant fix".

## 2013-05-24 NOTE — ED Notes (Signed)
Critical Troponin 0.77

## 2013-05-24 NOTE — Progress Notes (Signed)
Patient refuses BD treatments and does not wish to be bothered in the night. RT assessment protocol completed. Orders changed accordingly. RT will continue to follow

## 2013-05-24 NOTE — Progress Notes (Signed)
Echocardiogram 2D Echocardiogram has been performed.  Charlotte Fisher 05/24/2013, 3:38 PM

## 2013-05-24 NOTE — ED Notes (Signed)
Dr Patria Mane notified of troponin 0.76

## 2013-05-24 NOTE — H&P (Addendum)
[facility] History and Physical  Zarrah Loveland JXB:[id number] DOB: October 15, 1965 DOA: 05/24/2013   PCP: She is supposed to be seen by [facility] Specialists: Mental Health providers  Chief Complaint: Upper abdominal pain, chest pain, back pain since yesterday.  HPI: Charlotte Fisher is a 47 y.o. female with a past medical history of mental health which is possibly bipolar, COPD, who has chronic back pain, for which she takes ibuprofen on a daily basis who was in her usual state of health last night when she has excruciating upper abdominal pain, which radiated to her mid chest has plus to the back. But then she also tells me that the back pain, that she has is chronic. She is a very poor historian and it was very difficult to get pertinent information from her. She tells me, that she's had these kind of pains in the past, but this is most intense it has been. She describes the abdominal pain as aching type pain with radiation to the chest as described above. She does have a history of heartburn and acid reflux, but not on a chronic basis. The pain was 10 out of 10 in intensity. Currently, 6/10 in intensity in the abdomen. Denies any chest pain at this time. She has noted palpitations. She's noted abdominal distention. Denies any dizziness or lightheadedness. She has had sweating episodes over the last 12 hours. Denies any leg swelling. She did get a little short of breath with her symptoms. Denies any shortness of breath currently. Denies any fever or chills. She's never had a stress test or a cardiac catheterization in the past. She takes upto 21 tablets of Ibuprofen on daily basis.  Home Medications: Prior to Admission medications   Medication Sig Start Date End Date Taking? Authorizing Provider  albuterol (PROVENTIL HFA;VENTOLIN HFA) 108 (90 BASE) MCG/ACT inhaler Inhale 2 puffs into the lungs every 6 (six) hours as needed for wheezing. 04/15/13  Yes Celene Kras, MD  amitriptyline  (ELAVIL) 10 MG tablet Take 1 tablet (10 mg total) by mouth at bedtime. 04/15/13  Yes Celene Kras, MD  diazepam (VALIUM) 5 MG tablet Take 1 tablet (5 mg total) by mouth 2 (two) times daily. 06/22/12  Yes Fayrene Helper, PA-C  hydrOXYzine (ATARAX/VISTARIL) 25 MG tablet Take 2 tablets (50 mg total) by mouth 3 (three) times daily. 04/15/13  Yes Celene Kras, MD  ibuprofen (ADVIL,MOTRIN) 200 MG tablet Take 400 mg by mouth every 6 (six) hours as needed for pain.   Yes Historical Provider, MD  QUEtiapine (SEROQUEL XR) 50 MG TB24 24 hr tablet Take 3 tablets (150 mg total) by mouth at bedtime. 04/15/13  Yes Celene Kras, MD  tiotropium (SPIRIVA HANDIHALER) 18 MCG inhalation capsule Place 1 capsule (18 mcg total) into inhaler and inhale daily. 04/15/13  Yes Celene Kras, MD    Allergies: No Known Allergies  Past Medical History: Past Medical History  Diagnosis Date  . Asthma   . COPD (chronic obstructive pulmonary disease)   . Depression   . Anxiety     Past Surgical History  Procedure Laterality Date  . Tubal ligation      Social History: Patient lives in [place] with her friend. She smokes 1.5 packs of cigarettes on a daily basis. She drinks liquor and wine on daily basis from morning to night. Did not quantify further. Denies any illicit drug use. She is independent with daily activities.  Family History:  Family History  Problem Relation Age of  Onset  . Stroke Mother   She also mentions history of heart disease in her Uncle and states her siblings have diabetes.  Review of Systems - History obtained from the patient General ROS: positive for  - fatigue Psychological ROS: negative Ophthalmic ROS: negative ENT ROS: negative Allergy and Immunology ROS: negative Hematological and Lymphatic ROS: negative Endocrine ROS: negative Respiratory ROS: as in hpi Cardiovascular ROS: as in hpi Gastrointestinal ROS: as in hpi Genito-Urinary ROS: no dysuria, trouble voiding, or  hematuria Musculoskeletal ROS: negative Neurological ROS: no TIA or stroke symptoms Dermatological ROS: negative  Physical Examination  Filed Vitals:   05/24/13 0856 05/24/13 1203  BP: 170/103   Pulse: 71   Temp: 98.9 F (37.2 C)   TempSrc: Oral   Resp: 28   Height:  5\' 1"  (1.549 m)  Weight:  80.74 kg (178 lb)  SpO2: 100%     General appearance: alert, cooperative, appears stated age, no distress and moderately obese Head: Normocephalic, without obvious abnormality, atraumatic Eyes: conjunctivae/corneas clear. PERRL, EOM's intact.  Throat: lips, mucosa, and tongue normal; teeth and gums normal Neck: no adenopathy, no carotid bruit, no JVD, supple, symmetrical, trachea midline and thyroid not enlarged, symmetric, no tenderness/mass/nodules Back: symmetric, no curvature. ROM normal. No CVA tenderness. Chest wall: tender to palpation with some reproducibility of pain Cardio: regular rate and rhythm, S1, S2 normal, no murmur, click, rub or gallop GI: Abdomen is obese, but soft. Tenderness in the epigastric area without any rebound, rigidity, or guarding. No masses, or organomegaly. Bowel sounds are present. Extremities: extremities normal, atraumatic, no cyanosis or edema Pulses: 2+ and symmetric Skin: Skin color, texture, turgor normal. No rashes or lesions Neurologic: No focal deficits  Laboratory Data: Results for orders placed during the hospital encounter of 05/24/13 (from the past 48 hour(s))  CBC WITH DIFFERENTIAL     Status: None   Collection Time    05/24/13  9:12 AM      Result Value Range   WBC 9.5  4.0 - 10.5 K/uL   RBC 4.38  3.87 - 5.11 MIL/uL   Hemoglobin 14.6  12.0 - 15.0 g/dL   HCT 45.4  09.8 - 11.9 %   MCV 96.3  78.0 - 100.0 fL   MCH 33.3  26.0 - 34.0 pg   MCHC 34.6  30.0 - 36.0 g/dL   RDW 14.7  82.9 - 56.2 %   Platelets 212  150 - 400 K/uL   Neutrophils Relative % 61  43 - 77 %   Neutro Abs 5.8  1.7 - 7.7 K/uL   Lymphocytes Relative 28  12 - 46 %    Lymphs Abs 2.7  0.7 - 4.0 K/uL   Monocytes Relative 8  3 - 12 %   Monocytes Absolute 0.8  0.1 - 1.0 K/uL   Eosinophils Relative 2  0 - 5 %   Eosinophils Absolute 0.2  0.0 - 0.7 K/uL   Basophils Relative 1  0 - 1 %   Basophils Absolute 0.1  0.0 - 0.1 K/uL  COMPREHENSIVE METABOLIC PANEL     Status: Abnormal   Collection Time    05/24/13  9:12 AM      Result Value Range   Sodium 137  135 - 145 mEq/L   Potassium 3.4 (*) 3.5 - 5.1 mEq/L   Chloride 103  96 - 112 mEq/L   CO2 25  19 - 32 mEq/L   Glucose, Bld 94  70 - 99 mg/dL  BUN 10  6 - 23 mg/dL   Creatinine, Ser 1.61  0.50 - 1.10 mg/dL   Calcium 09.6  8.4 - 04.5 mg/dL   Total Protein 7.6  6.0 - 8.3 g/dL   Albumin 3.5  3.5 - 5.2 g/dL   AST 28  0 - 37 U/L   ALT 30  0 - 35 U/L   Alkaline Phosphatase 86  39 - 117 U/L   Total Bilirubin 0.3  0.3 - 1.2 mg/dL   GFR calc non Af Amer 69 (*) >90 mL/min   GFR calc Af Amer 80 (*) >90 mL/min   Comment: (NOTE)     The eGFR has been calculated using the CKD EPI equation.     This calculation has not been validated in all clinical situations.     eGFR's persistently <90 mL/min signify possible Chronic Kidney     Disease.  LIPASE, BLOOD     Status: None   Collection Time    05/24/13  9:12 AM      Result Value Range   Lipase 36  11 - 59 U/L  TROPONIN I     Status: Abnormal   Collection Time    05/24/13  9:12 AM      Result Value Range   Troponin I 0.77 (*) <0.30 ng/mL   Comment:            Due to the release kinetics of cTnI,     a negative result within the first hours     of the onset of symptoms does not rule out     myocardial infarction with certainty.     If myocardial infarction is still suspected,     repeat the test at appropriate intervals.     CRITICAL RESULT CALLED TO, READ BACK BY AND VERIFIED WITH:     HALL,C. RN AT 1032 05/24/13 BARFIELD,T  ETHANOL     Status: None   Collection Time    05/24/13  9:12 AM      Result Value Range   Alcohol, Ethyl (B) <11  0 - 11 mg/dL    Comment:            LOWEST DETECTABLE LIMIT FOR     SERUM ALCOHOL IS 11 mg/dL     FOR MEDICAL PURPOSES ONLY  POCT I-STAT TROPONIN I     Status: None   Collection Time    05/24/13  9:23 AM      Result Value Range   Troponin i, poc 0.00  0.00 - 0.08 ng/mL   Comment 3            Comment: Due to the release kinetics of cTnI,     a negative result within the first hours     of the onset of symptoms does not rule out     myocardial infarction with certainty.     If myocardial infarction is still suspected,     repeat the test at appropriate intervals.  URINALYSIS, ROUTINE W REFLEX MICROSCOPIC     Status: None   Collection Time    05/24/13 10:19 AM      Result Value Range   Color, Urine YELLOW  YELLOW   APPearance CLEAR  CLEAR   Specific Gravity, Urine 1.017  1.005 - 1.030   pH 7.5  5.0 - 8.0   Glucose, UA NEGATIVE  NEGATIVE mg/dL   Hgb urine dipstick NEGATIVE  NEGATIVE   Bilirubin Urine NEGATIVE  NEGATIVE   Ketones,  ur NEGATIVE  NEGATIVE mg/dL   Protein, ur NEGATIVE  NEGATIVE mg/dL   Urobilinogen, UA 0.2  0.0 - 1.0 mg/dL   Nitrite NEGATIVE  NEGATIVE   Leukocytes, UA NEGATIVE  NEGATIVE   Comment: MICROSCOPIC NOT DONE ON URINES WITH NEGATIVE PROTEIN, BLOOD, LEUKOCYTES, NITRITE, OR GLUCOSE <1000 mg/dL.  PREGNANCY, URINE     Status: None   Collection Time    05/24/13 10:19 AM      Result Value Range   Preg Test, Ur NEGATIVE  NEGATIVE   Comment:            THE SENSITIVITY OF THIS     METHODOLOGY IS >20 mIU/mL.  TROPONIN I     Status: Abnormal   Collection Time    05/24/13 11:00 AM      Result Value Range   Troponin I 0.76 (*) <0.30 ng/mL   Comment:            Due to the release kinetics of cTnI,     a negative result within the first hours     of the onset of symptoms does not rule out     myocardial infarction with certainty.     If myocardial infarction is still suspected,     repeat the test at appropriate intervals.     CRITICAL VALUE NOTED.  VALUE IS CONSISTENT WITH  PREVIOUSLY REPORTED AND CALLED VALUE.    Radiology Reports: Dg Abd Acute W/chest  05/24/2013   CLINICAL DATA:  Mid abdominal pain  EXAM: ACUTE ABDOMEN SERIES (ABDOMEN 2 VIEW & CHEST 1 VIEW)  COMPARISON:  07/13/2008  FINDINGS: There is no evidence of dilated bowel loops or free intraperitoneal air. No radiopaque calculi or other significant radiographic abnormality is seen. Heart size and mediastinal contours are within normal limits. Both lungs are clear. Moderate to large amount of fecal retention within the transverse and descending colon regions. There is a paucity of bowel gas. There is S-shaped scoliosis of the upper and mid thoracic spine.  IMPRESSION: Negative abdominal radiographs. No acute cardiopulmonary disease. Moderate to large amount of fecal retention.   Electronically Signed   By: Salome Holmes M.D.   On: 05/24/2013 10:45    Electrocardiogram: Sinus rhythm at 76 beats per minute. Normal axis. Intervals are normal. No Q waves. No concerning ST or T-wave changes are noted. Similar to previous EKGs.  Problem List  Principal Problem:   Chest pain at rest Active Problems:   Abdominal pain, epigastric   Elevated troponin level   Acute gastritis   Obesity   COPD (chronic obstructive pulmonary disease)   Assessment: This is a 47 year old, Caucasian female, who presents with upper abdominal pain, radiating to the chest and back. She's a very poor historian. She does admit to taking a lot of NSAIDs at home for chronic back pain. Her abdominal pain is suggestive of acute gastritis. She does have mildly elevated troponin the significance of which is unclear. EKG is nonischemic. Her symptoms are not suggestive of venous thromboembolism. Mediastinum is normal on chest x-ray.  Plan: #1 atypical chest pain with elevated troponin: She has been given a therapeutic dose of Lovenox. We will give her a small dose of beta blocker. We will trend her troponins. Continue with aspirin. We'll get an  echocardiogram and repeat EKG. We will consult cardiology to see her.   #2 elevated BP: Likely has HTN. See response to BB. May need additional agents or alternative agents if BB is not tolerated due  to COPD.  #3 Upper abdominal pain: Most likely acute gastritis induced by NSAIDs. Lipase is normal. LFTs are normal. We'll give her GI cocktail, and PPI.  #4 history of COPD: Continue with the nebulizer treatments, and Spiriva. She continues to smoke, unfortunately.  #5 tobacco abuse: Prescribe nicotine patch. Counseling will need to be provided  #6 alcohol abuse: Ativan protocol will be utilized. Thiamine will be given. She'll be monitored on telemetry.   #7 history of mental health disorders: Most likely bipolar and anxiety: Continue with the psychotropic agents.  DVT Prophylaxis: Lovenox Code Status: Full code Family Communication: Discussed with the patient  Disposition Plan: Admit to telemetry   Further management decisions will depend on results of further testing and patient's response to treatment.  [facility]  [facility] Pager [phone]  If 7PM-7AM, please contact night-coverage [email or web address] Password [email or web address]  05/24/2013, 1:18 PM

## 2013-05-24 NOTE — ED Provider Notes (Addendum)
CSN: [id number]     Arrival date & time 05/24/13  0845 History   First MD Initiated Contact with Patient 05/24/13 0848     Chief Complaint  Patient presents with  . Abdominal Pain  . Back Pain  . Nausea  . Emesis    The history is provided by the patient.   patient reports developing nausea vomiting and upper abdominal pain and lower chest discomfort. over the past 8-10 hours.  She denies hematemesis.  No melena or hematochezia.  She states history of alcohol abuse and states that she drink alcohol every day.  She's never had pancreatitis before.  Her last drink was this morning at approximately 2 AM.  She reports the pain in her upper abdomen and lower chest radiates through to her back.  She also reports some radiation of this discomfort in her lower chest.  She states it gives her shortness of breath.  No prior history of cardiac disease.  No fevers or chills.  No cough or congestion.  No lower abdominal pain.  No urinary complaints.  Her last menstrual cycle was 5 months ago and she feels as though she is moving to menopause however she had some spotting several days ago.  She feels as though her abdomen is slightly distended.  Nothing worsens her pain.  Nothing improves her pain. Some pain across her bilateral shoulder blades as well. Uncle with hx of cardiac stents in his 32s  Past Medical History  Diagnosis Date  . Asthma   . COPD (chronic obstructive pulmonary disease)   . Depression   . Anxiety    Past Surgical History  Procedure Laterality Date  . Tubal ligation     Family History  Problem Relation Age of Onset  . Stroke Mother    History  Substance Use Topics  . Smoking status: Current Every Day Smoker -- 1.50 packs/day  . Smokeless tobacco: Not on file  . Alcohol Use: Yes   OB History   Grav Para Term Preterm Abortions TAB SAB Ect Mult Living                 Review of Systems  Gastrointestinal: Positive for vomiting and abdominal pain.  Musculoskeletal: Positive  for back pain.  All other systems reviewed and are negative.    Allergies  Review of patient's allergies indicates no known allergies.  Home Medications   Current Outpatient Rx  Name  Route  Sig  Dispense  Refill  . albuterol (PROVENTIL HFA;VENTOLIN HFA) 108 (90 BASE) MCG/ACT inhaler   Inhalation   Inhale 2 puffs into the lungs every 6 (six) hours as needed for wheezing.   1 Inhaler   1   . amitriptyline (ELAVIL) 10 MG tablet   Oral   Take 1 tablet (10 mg total) by mouth at bedtime.   30 tablet   0   . diazepam (VALIUM) 5 MG tablet   Oral   Take 1 tablet (5 mg total) by mouth 2 (two) times daily.   10 tablet   0   . hydrOXYzine (ATARAX/VISTARIL) 25 MG tablet   Oral   Take 2 tablets (50 mg total) by mouth 3 (three) times daily.   60 tablet   0   . ibuprofen (ADVIL,MOTRIN) 200 MG tablet   Oral   Take 400 mg by mouth every 6 (six) hours as needed for pain.         Marland Kitchen QUEtiapine (SEROQUEL XR) 50 MG TB24 24 hr tablet  Oral   Take 3 tablets (150 mg total) by mouth at bedtime.   90 each   0   . tiotropium (SPIRIVA HANDIHALER) 18 MCG inhalation capsule   Inhalation   Place 1 capsule (18 mcg total) into inhaler and inhale daily.   30 capsule   12    BP 170/103  Pulse 71  Temp(Src) 98.9 F (37.2 C) (Oral)  Resp 28  Ht 5\' 1"  (1.549 m)  Wt 178 lb (80.74 kg)  BMI 33.65 kg/m2  SpO2 100% Physical Exam  Nursing note and vitals reviewed. Constitutional: She is oriented to person, place, and time. She appears well-developed and well-nourished. No distress.  HENT:  Head: Normocephalic and atraumatic.  Eyes: EOM are normal.  Neck: Normal range of motion.  Cardiovascular: Normal rate, regular rhythm and normal heart sounds.   Pulmonary/Chest: Effort normal and breath sounds normal.  Abdominal: Soft. She exhibits no distension.  Epigastric tenderness without guarding or rebound.  Musculoskeletal: Normal range of motion.  Neurological: She is alert and oriented to  person, place, and time.  Skin: Skin is warm and dry.  Psychiatric: She has a normal mood and affect. Judgment normal.    ED Course  Procedures (including critical care time) Labs Review Labs Reviewed  COMPREHENSIVE METABOLIC PANEL - Abnormal; Notable for the following:    Potassium 3.4 (*)    GFR calc non Af Amer 69 (*)    GFR calc Af Amer 80 (*)    All other components within normal limits  TROPONIN I - Abnormal; Notable for the following:    Troponin I 0.77 (*)    All other components within normal limits  TROPONIN I - Abnormal; Notable for the following:    Troponin I 0.76 (*)    All other components within normal limits  CBC WITH DIFFERENTIAL  LIPASE, BLOOD  URINALYSIS, ROUTINE W REFLEX MICROSCOPIC  PREGNANCY, URINE  ETHANOL  POCT I-STAT TROPONIN I   Imaging Review Dg Abd Acute W/chest  05/24/2013   CLINICAL DATA:  Mid abdominal pain  EXAM: ACUTE ABDOMEN SERIES (ABDOMEN 2 VIEW & CHEST 1 VIEW)  COMPARISON:  07/13/2008  FINDINGS: There is no evidence of dilated bowel loops or free intraperitoneal air. No radiopaque calculi or other significant radiographic abnormality is seen. Heart size and mediastinal contours are within normal limits. Both lungs are clear. Moderate to large amount of fecal retention within the transverse and descending colon regions. There is a paucity of bowel gas. There is S-shaped scoliosis of the upper and mid thoracic spine.  IMPRESSION: Negative abdominal radiographs. No acute cardiopulmonary disease. Moderate to large amount of fecal retention.   Electronically Signed   By: Salome Holmes M.D.   On: 05/24/2013 10:45  I personally reviewed the imaging tests through PACS system I reviewed available ER/hospitalization records through the EMR   EKG Interpretation    Date/Time:  Tuesday May 24 2013 08:55:28 EST Ventricular Rate:  76 PR Interval:  142 QRS Duration: 108 QT Interval:  407 QTC Calculation: 458 R Axis:   -16 Text Interpretation:   Sinus rhythm Borderline left axis deviation RSR' in V1 or V2, right VCD or RVH No significant change since last tracing Confirmed by POLLINA  MD, CHRISTOPHER ([id number]) on 05/24/2013 8:59:13 AM           ECG interpretation 1040am (2nd ECG)  Date: 05/24/2013  Rate: 76  Rhythm: normal sinus rhythm  QRS Axis: normal  Intervals: normal  ST/T Wave abnormalities: normal  Conduction Disutrbances: none  Narrative Interpretation:   Old EKG Reviewed: No significant changes noted    MDM   1. Chest pain   2. Abdominal pain   3. Cardiac enzymes elevated     the patient's complaints are more related to upper abdominal discomfort.  This could represent gastritis from ongoing ibuprofen use associated with alcohol abuse.  Lipase is normal this does not appear to be pancreatitis.  My suspicion for gallbladder disease is low.  Interestingly her initial troponin came back elevated at 0.77.  This was repeated as it did not seem completely consistent with her evaluation.  Repeat troponin again is elevated at 0.76.  This could be the presentation of atypical ACS non-STEMI.  Repeat EKG again shows no concerning ST segments or ischemic changes.  These are unchanged from prior EKGs.  I discussed the case with cardiology Dr. Elease Hashimoto he states this is likely unrelated to true acute coronary syndrome and requested the patient be admitted to the hospitalist service.  I don't think this is unreasonable as the patient's symptoms are very atypical.  I do think the patient will benefit from hospitalization for serial troponins.  Aspirin was given.  A single dose of Lovenox was given in the ER while we evaluate.  I appreciate the assistance of [facility] Dr. Rito Ehrlich in the evaluation of this patient.     Lyanne Co, MD 05/24/13 1333  Lyanne Co, MD 05/24/13 [phone]

## 2013-05-24 NOTE — ED Notes (Signed)
Report given to [facility] for bed 1423, states bed won't be ready for 
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
## 2013-05-25 ENCOUNTER — Inpatient Hospital Stay (HOSPITAL_COMMUNITY): Payer: Medicaid Other

## 2013-05-25 ENCOUNTER — Encounter (HOSPITAL_COMMUNITY): Payer: Self-pay | Admitting: Radiology

## 2013-05-25 DIAGNOSIS — R7989 Other specified abnormal findings of blood chemistry: Secondary | ICD-10-CM

## 2013-05-25 LAB — CBC
HCT: 38.4 % (ref 36.0–46.0)
Hemoglobin: 12.7 g/dL (ref 12.0–15.0)
MCH: 32.4 pg (ref 26.0–34.0)
MCV: 98 fL (ref 78.0–100.0)
RBC: 3.92 MIL/uL (ref 3.87–5.11)
WBC: 7.9 10*3/uL (ref 4.0–10.5)

## 2013-05-25 LAB — COMPREHENSIVE METABOLIC PANEL
AST: 25 U/L (ref 0–37)
Albumin: 3.1 g/dL — ABNORMAL LOW (ref 3.5–5.2)
BUN: 16 mg/dL (ref 6–23)
Calcium: 8.9 mg/dL (ref 8.4–10.5)
Chloride: 103 mEq/L (ref 96–112)
Creatinine, Ser: 1.21 mg/dL — ABNORMAL HIGH (ref 0.50–1.10)
Potassium: 3.6 mEq/L (ref 3.5–5.1)
Total Protein: 6.7 g/dL (ref 6.0–8.3)

## 2013-05-25 LAB — LIPID PANEL
Cholesterol: 133 mg/dL (ref 0–200)
HDL: 55 mg/dL (ref >39)
LDL Cholesterol: 63 mg/dL (ref 0–99)
Total CHOL/HDL Ratio: 2.4 RATIO
VLDL: 15 mg/dL (ref 0–40)

## 2013-05-25 LAB — TSH: TSH: 0.852 u[IU]/mL (ref 0.350–4.500)

## 2013-05-25 MED ORDER — LIDOCAINE 5 % EX PTCH: 1.0000 | MEDICATED_PATCH | CUTANEOUS | Status: DC

## 2013-05-25 MED ORDER — OMEPRAZOLE 40 MG PO CPDR: 40.0000 mg | DELAYED_RELEASE_CAPSULE | Freq: Every day | ORAL | Status: DC

## 2013-05-25 MED ORDER — ADULT MULTIVITAMIN W/MINERALS CH: 1.0000 | ORAL_TABLET | Freq: Every day | ORAL | Status: DC

## 2013-05-25 MED ORDER — SORBITOL 70 % SOLN
960.0000 mL | TOPICAL_OIL | Freq: Once | ORAL | Status: AC
  Administered 2013-05-25: 960 mL via RECTAL
  Filled 2013-05-25: qty 240

## 2013-05-25 MED ORDER — DIAZEPAM 5 MG PO TABS: 5.0000 mg | ORAL_TABLET | Freq: Two times a day (BID) | ORAL | Status: DC

## 2013-05-25 MED ORDER — DIAZEPAM 5 MG PO TABS
5.0000 mg | ORAL_TABLET | Freq: Once | ORAL | Status: AC
  Administered 2013-05-25: 5 mg via ORAL
  Filled 2013-05-25: qty 1

## 2013-05-25 MED ORDER — METOPROLOL TARTRATE 25 MG PO TABS: 25.0000 mg | ORAL_TABLET | Freq: Two times a day (BID) | ORAL | Status: DC

## 2013-05-25 MED ORDER — AMLODIPINE BESYLATE 5 MG PO TABS: 5.0000 mg | ORAL_TABLET | Freq: Every day | ORAL | Status: DC

## 2013-05-25 MED ORDER — METOPROLOL TARTRATE 25 MG PO TABS
25.0000 mg | ORAL_TABLET | Freq: Two times a day (BID) | ORAL | Status: DC
  Administered 2013-05-25: 25 mg via ORAL
  Filled 2013-05-25 (×2): qty 1

## 2013-05-25 MED ORDER — LORAZEPAM 1 MG PO TABS: ORAL_TABLET | ORAL | Status: DC

## 2013-05-25 MED ORDER — AMLODIPINE BESYLATE 5 MG PO TABS
5.0000 mg | ORAL_TABLET | Freq: Every day | ORAL | Status: DC
  Administered 2013-05-25: 10:00:00 5 mg via ORAL
  Filled 2013-05-25: qty 1

## 2013-05-25 MED ORDER — IOHEXOL 350 MG/ML SOLN
100.0000 mL | Freq: Once | INTRAVENOUS | Status: AC | PRN
  Administered 2013-05-25: 100 mL via INTRAVENOUS

## 2013-05-25 MED ORDER — HYDRALAZINE HCL 25 MG PO TABS
25.0000 mg | ORAL_TABLET | Freq: Four times a day (QID) | ORAL | Status: DC | PRN
  Filled 2013-05-25: qty 1

## 2013-05-25 NOTE — Progress Notes (Signed)
Subjective:  Still with mild epigastric and chest pain - has not gone away overnight. Was sleeping soundly.  Objective:  Vital Signs in the last 24 hours: Temp:  [97.5 F (36.4 C)-99.1 F (37.3 C)] 97.5 F (36.4 C) (11/26 0510) Pulse Rate:  [71-103] 81 (11/26 0510) Resp:  [18-28] 18 (11/26 0510) BP: (122-170)/(86-103) 150/103 mmHg (11/26 0510) SpO2:  [97 %-100 %] 97 % (11/26 0510) Weight:  [178 lb (80.74 kg)] 178 lb (80.74 kg) (11/25 1556)  Intake/Output from previous day: 11/25 0701 - 11/26 0700 In: 363 [P.O.:360; I.V.:3] Out: -   Physical Exam: Pt is alert and oriented, NAD HEENT: normal Neck: JVP - normal Lungs: CTA bilaterally CV: RRR without murmur or gallop Abd: soft, NT, Positive BS, no hepatomegaly Ext: no C/C/E, distal pulses intact and equal Skin: warm/dry no rash   Lab Results:  Recent Labs  05/24/13 0912 05/25/13 0136  WBC 9.5 7.9  HGB 14.6 12.7  PLT 212 173    Recent Labs  05/24/13 0912 05/25/13 0136  NA 137 136  K 3.4* 3.6  CL 103 103  CO2 25 25  GLUCOSE 94 94  BUN 10 16  CREATININE 0.96 1.21*    Recent Labs  05/24/13 1947 05/25/13 0136  TROPONINI 0.70* 0.68*    Cardiac Studies: 2D Echo: Left ventricle: The cavity size was normal. Systolic function was normal. The estimated ejection fraction was in the range of 55% to 60%. Regional wall motion abnormalities: Possible hypokinesis of the basal inferior myocardium. Challenging windows. Doppler parameters are consistent with abnormal left ventricular relaxation (grade 1 diastolic dysfunction).  ------------------------------------------------------------ Aortic valve: Structurally normal valve. Cusp separation was normal. Doppler: Transvalvular velocity was within the normal range. There was no stenosis. No regurgitation.  ------------------------------------------------------------ Aorta: The aorta was normal, not dilated, and  non-diseased.  ------------------------------------------------------------ Mitral valve: Structurally normal valve. Leaflet separation was normal. Doppler: Transvalvular velocity was within the normal range. There was no evidence for stenosis. Trivial regurgitation. Peak gradient: 3mm Hg (D).  ------------------------------------------------------------ Left atrium: The atrium was normal in size.  ------------------------------------------------------------ Right ventricle: The cavity size was normal. Wall thickness was normal. Systolic function was normal.  ------------------------------------------------------------ Pulmonic valve: Poorly visualized. Structurally normal valve. Cusp separation was normal. Doppler: Transvalvular velocity was within the normal range. No regurgitation.  ------------------------------------------------------------ Tricuspid valve: Structurally normal valve. Leaflet separation was normal. Doppler: Mild regurgitation.  ------------------------------------------------------------ Pulmonary artery: The main pulmonary artery was normal-sized.  ------------------------------------------------------------ Right atrium: The atrium was normal in size.  ------------------------------------------------------------ Pericardium: The pericardium was normal in appearance. There was no pericardial effusion.  ------------------------------------------------------------ Systemic veins: Inferior vena cava: The vessel was normal in size; the respirophasic diameter changes were in the normal range (= 50%); findings are consistent with normal central venous pressure.  ------------------------------------------------------------ Post procedure conclusions Ascending Aorta:  - The aorta was normal, not dilated, and non-diseased.  ------------------------------------------------------------  2D measurements Normal Doppler Normal Left ventricle  measurements LVID ED, 45 mm 43-52 Left ventricle chord, Ea, lat 8.7 cm/ ------- PLAX ann, tiss s LVID ES, 33.9 mm 23-38 DP chord, E/Ea, lat 10.54 ------- PLAX ann, tiss FS, chord, 25 % >29 DP PLAX Ea, med 9.25 cm/ ------- LVPW, ED 10.4 mm ------ ann, tiss s IVS/LVPW 1.06 <1.3 DP ratio, ED E/Ea, med 9.91 ------- Ventricular septum ann, tiss IVS, ED 11 mm ------ DP Aorta Mitral valve Root diam, 28 mm ------ Peak E vel 91.7 cm/ ------- ED s Left atrium Peak A vel 107 cm/ -------  AP dim 33 mm ------ s AP dim 1.73 cm/m^2 <2.2 Deceleratio 173 ms 150-230 index n time Peak 3 mm ------- gradient, D Hg Peak E/A 0.9 ------- ratio  Tele: Personally reviewed. No arrhythmia - sinus rhythm throughout.  Assessment/Plan:  1. Abdominal/Chest pain - highly atypical for cardiac pain - constant greater than 24 hours. 2. Elevated troponin of unclear significance - low level, flat trend. I do not think this is ACS.  3. Malignant HTN 4. Alcoholism 5. Bipolar disorder 6. Heavy NSAID use  Echo/labs reviewed. Echo findings reassuring with overall preserved LV systolic function. Her pain is clearly not anginal. I would not recommend further cardiac evaluation at this time. Add amlodipine 5 mg for treatment of HTN. Continue metoprolol. Please call if we can be of any further assistance. thx   Tonny Bollman, M.D. 05/25/2013, 7:42 AM

## 2013-05-25 NOTE — Discharge Summary (Signed)
Physician Discharge Summary  Charlotte Fisher ZOX:[id number] DOB: Oct 06, 1965 DOA: 05/24/2013  PCP: Provider Not In System  Admit date: 05/24/2013 Discharge date: 05/25/2013  Time spent: >52mins minutes  Recommendations for Outpatient Follow-up:  Follow-up Information   Please follow up. (PCP in 1week, call for appt upon discharge)        Discharge Diagnoses:  Principal Problem:   Chest pain at rest Active Problems:   Abdominal pain, epigastric   Elevated troponin level   Acute gastritis   Obesity   COPD (chronic obstructive pulmonary disease)   Discharge Condition: improved/stable  Diet recommendation: low sodium heart healthy  Filed Weights   05/24/13 1203 05/24/13 1556  Weight: 80.74 kg (178 lb) 80.74 kg (178 lb)    History of present illness:  Pt is a 47 y.o. female with a past medical history of mental health which is possibly bipolar, COPD, who has chronic back pain, for which she takes ibuprofen on a daily basis who was in her usual state of health last night when she has excruciating upper abdominal pain, which radiated to her mid chest has plus to the back. But then she also tells me that the back pain, that she has is chronic. She is a very poor historian and it was very difficult to get pertinent information from her. She tells me, that she's had these kind of pains in the past, but this is most intense it has been. She describes the abdominal pain as aching type pain with radiation to the chest as described above. She does have a history of heartburn and acid reflux, but not on a chronic basis. The pain was 10 out of 10 in intensity. Currently, 6/10 in intensity in the abdomen. Denies any chest pain at this time. She has noted palpitations. She's noted abdominal distention. Denies any dizziness or lightheadedness. She has had sweating episodes over the last 12 hours. Denies any leg swelling. She did get a little short of breath with her symptoms. Denies any shortness of  breath currently. Denies any fever or chills. She's never had a stress test or a cardiac catheterization in the past. She takes upto 21 tablets of Ibuprofen on daily basis   Hospital Course:  #1 atypical chest pain with elevated troponin:  Upon admission she was given a therapeutic dose of Lovenox. She was also placed on a beta blocker and ASA.  troponins were cycled and were elevated at  0.68, 0.7, 0.68. Cardiology was consulted and they saw pt and stated that it was not ACS,  an echocardiogram was obtained and showed an EF of 55% to 60%. Possible hypokinesis of the basal inferior myocardium.An CT angio was obtained to eval for PE and cam back neg. Pt had epigastric tenderness on exam in setting of alcohol abuse and the impression was that her symptoms were likely GI related/alcoholic/NSAID gastropathy. She was placed on PPI and treated with ativan per detox protocol and Her BP controlled with norvasc and metoprolol.She was to avoid NSAIDs, She improved clinically with treatment as above and is to follow up outpt. #2 elevated BP: Likely has HTN. As discussed above, her BP control improved with metoprolol and norvasc and she is to continue them on d/c and follow up with PCP. #3 Upper abdominal pain: Most likely acute gastritis induced by NSAIDs and/or alcohol. Lipase is normal. LFTs are normal. She is to continue PPI on d/c and follow up with PCP. #4 history of COPD: stable Continue with bronchodilators, and Spiriva.  #5  tobacco abuse: she was placed on nicotine patch, follow up with PCP.  #6 alcohol abuse: She was placed on Ativan protocol,Thiamine. She had no evidence of withdrawal on follow. She was to continue her diazepam on d/c and follow up with PCP.  #7 history of mental health disorders: Most likely bipolar and anxiety: She was maintained on her psychotropic agents and is to contine on d/c and follow up with her outpt MDs..      Procedures:  Echo Study Conclusions  Left ventricle: The  cavity size was normal. Systolic function was normal. The estimated ejection fraction was in the range of 55% to 60%. Possible hypokinesis of the basal inferior myocardium. Challenging windows. Doppler parameters are consistent with abnormal left ventricular relaxation (grade 1 diastolic dysfunction).  Impressions:  - Overall reassuring echocardiogram.    Consultations:  cardiology  Discharge Exam: Filed Vitals:   05/25/13 1319  BP: 118/73  Pulse: 76  Temp: 98.7 F (37.1 C)  Resp: 18   Exam:  General: alert & oriented x 3 In NAD Cardiovascular: RRR, nl S1 s2 Respiratory: moderate air mov't, no crackles and no wheezes Abdomen: soft +BS, mild epigastric tenderness, ND, no masses palpable Extremities: No cyanosis and no edema    Discharge Instructions  Discharge Orders   Future Orders Complete By Expires   Diet - low sodium heart healthy  As directed    Increase activity slowly  As directed        Medication List    TAKE these medications       albuterol 108 (90 BASE) MCG/ACT inhaler  Commonly known as:  PROVENTIL HFA;VENTOLIN HFA  Inhale 2 puffs into the lungs every 6 (six) hours as needed for wheezing.     amitriptyline 10 MG tablet  Commonly known as:  ELAVIL  Take 1 tablet (10 mg total) by mouth at bedtime.     amLODipine 5 MG tablet  Commonly known as:  NORVASC  Take 1 tablet (5 mg total) by mouth daily.     diazepam 5 MG tablet  Commonly known as:  VALIUM  Take 1 tablet (5 mg total) by mouth 2 (two) times daily.     hydrOXYzine 25 MG tablet  Commonly known as:  ATARAX/VISTARIL  Take 2 tablets (50 mg total) by mouth 3 (three) times daily.     lidocaine 5 %  Commonly known as:  LIDODERM  Place 1 patch onto the skin daily. Remove & Discard patch within 12 hours or as directed by MD     metoprolol tartrate 25 MG tablet  Commonly known as:  LOPRESSOR  Take 1 tablet (25 mg total) by mouth 2 (two) times daily.     multivitamin with minerals Tabs  tablet  Take 1 tablet by mouth daily.     omeprazole 40 MG capsule  Commonly known as:  PRILOSEC  Take 1 capsule (40 mg total) by mouth daily.     QUEtiapine 50 MG Tb24 24 hr tablet  Commonly known as:  SEROQUEL XR  Take 3 tablets (150 mg total) by mouth at bedtime.     tiotropium 18 MCG inhalation capsule  Commonly known as:  SPIRIVA HANDIHALER  Place 1 capsule (18 mcg total) into inhaler and inhale daily.      ASK your doctor about these medications       ibuprofen 200 MG tablet  Commonly known as:  ADVIL,MOTRIN  Take 400 mg by mouth every 6 (six) hours as needed for pain.  No Known Allergies    The results of significant diagnostics from this hospitalization (including imaging, microbiology, ancillary and laboratory) are listed below for reference.    Significant Diagnostic Studies: Ct Angio Chest Pe W/cm &/or Wo Cm  05/25/2013   CLINICAL DATA:  Shortness of breath, mid epigastric pain  EXAM: CT ANGIOGRAPHY CHEST WITH CONTRAST  TECHNIQUE: Multidetector CT imaging of the chest was performed using the standard protocol during bolus administration of intravenous contrast. Multiplanar CT image reconstructions including MIPs were obtained to evaluate the vascular anatomy.  CONTRAST:  OMNIPAQUE IOHEXOL 350 MG/ML SOLN  COMPARISON:  None.  FINDINGS: The thoracic inlet is unremarkable. Is no evidence of mediastinal masses nor pathologic sized adenopathy. There is no evidence of filling defects within the main, lobar, or segmental pulmonary arteries. Minimal hypoventilation is appreciated within the dependent portions of the lung parenchyma which is otherwise unremarkable. There is no CT evidence of chest small abnormalities. The visualized upper abdominal viscera demonstrate no gross abnormalities.  Review of the MIP images confirms the above findings.  IMPRESSION: No CT evidence of pulmonary arterial embolic disease. No focal or acute abnormalities appreciated.   Electronically  Signed   By: Salome Holmes M.D.   On: 05/25/2013 12:40   Dg Abd Acute W/chest  05/24/2013   CLINICAL DATA:  Mid abdominal pain  EXAM: ACUTE ABDOMEN SERIES (ABDOMEN 2 VIEW & CHEST 1 VIEW)  COMPARISON:  07/13/2008  FINDINGS: There is no evidence of dilated bowel loops or free intraperitoneal air. No radiopaque calculi or other significant radiographic abnormality is seen. Heart size and mediastinal contours are within normal limits. Both lungs are clear. Moderate to large amount of fecal retention within the transverse and descending colon regions. There is a paucity of bowel gas. There is S-shaped scoliosis of the upper and mid thoracic spine.  IMPRESSION: Negative abdominal radiographs. No acute cardiopulmonary disease. Moderate to large amount of fecal retention.   Electronically Signed   By: Salome Holmes M.D.   On: 05/24/2013 10:45    Microbiology: No results found for this or any previous visit (from the past 240 hour(s)).   Labs: Basic Metabolic Panel:  Recent Labs Lab 05/24/13 0912 05/25/13 0136  NA 137 136  K 3.4* 3.6  CL 103 103  CO2 25 25  GLUCOSE 94 94  BUN 10 16  CREATININE 0.96 1.21*  CALCIUM 10.0 8.9   Liver Function Tests:  Recent Labs Lab 05/24/13 0912 05/25/13 0136  AST 28 25  ALT 30 25  ALKPHOS 86 72  BILITOT 0.3 0.4  PROT 7.6 6.7  ALBUMIN 3.5 3.1*    Recent Labs Lab 05/24/13 0912  LIPASE 36   No results found for this basename: AMMONIA,  in the last 168 hours CBC:  Recent Labs Lab 05/24/13 0912 05/25/13 0136  WBC 9.5 7.9  NEUTROABS 5.8  --   HGB 14.6 12.7  HCT 42.2 38.4  MCV 96.3 98.0  PLT 212 173   Cardiac Enzymes:  Recent Labs Lab 05/24/13 0912 05/24/13 1100 05/24/13 1400 05/24/13 1947 05/25/13 0136  TROPONINI 0.77* 0.76* 0.68* 0.70* 0.68*   BNP: BNP (last 3 results)  Recent Labs  04/15/13 1805  PROBNP 248.9*   CBG: No results found for this basename: GLUCAP,  in the last 168 hours     Signed:  Kela Millin  [facility] 05/25/2013, 4:39 PM

## 2013-05-31 ENCOUNTER — Telehealth: Payer: Self-pay | Admitting: Emergency Medicine

## 2013-05-31 NOTE — Telephone Encounter (Signed)
Attempted to reach pt to check on gastrointestinal bleeding complaint 05/26/13. Pt was informed by operator on call to seek medical attention urgent.from looking at encounter, pt didn't go to ER.

## 2013-06-01 ENCOUNTER — Emergency Department (HOSPITAL_COMMUNITY)
Admission: EM | Admit: 2013-06-01 | Discharge: 2013-06-01 | Disposition: A | Discharge status: Other Acute Inpatient Hospital | Payer: Medicaid Other | Attending: Emergency Medicine | Admitting: Emergency Medicine

## 2013-06-01 ENCOUNTER — Inpatient Hospital Stay (HOSPITAL_COMMUNITY)
Admission: AD | Admit: 2013-06-01 | Discharge: 2013-06-06 | DRG: 897 | Disposition: A | Discharge status: Home / Self Care | Payer: Medicaid Other | Source: Intra-hospital | Attending: Psychiatry | Admitting: Psychiatry

## 2013-06-01 ENCOUNTER — Encounter (HOSPITAL_COMMUNITY): Payer: Self-pay | Admitting: *Deleted

## 2013-06-01 ENCOUNTER — Encounter (HOSPITAL_COMMUNITY): Payer: Self-pay | Admitting: Emergency Medicine

## 2013-06-01 DIAGNOSIS — E669 Obesity, unspecified: Secondary | ICD-10-CM

## 2013-06-01 DIAGNOSIS — K2900 Acute gastritis without bleeding: Secondary | ICD-10-CM

## 2013-06-01 DIAGNOSIS — F1010 Alcohol abuse, uncomplicated: Secondary | ICD-10-CM

## 2013-06-01 DIAGNOSIS — F1910 Other psychoactive substance abuse, uncomplicated: Secondary | ICD-10-CM

## 2013-06-01 DIAGNOSIS — F1110 Opioid abuse, uncomplicated: Secondary | ICD-10-CM | POA: Diagnosis present

## 2013-06-01 DIAGNOSIS — F411 Generalized anxiety disorder: Secondary | ICD-10-CM | POA: Insufficient documentation

## 2013-06-01 DIAGNOSIS — F1994 Other psychoactive substance use, unspecified with psychoactive substance-induced mood disorder: Secondary | ICD-10-CM | POA: Diagnosis present

## 2013-06-01 DIAGNOSIS — Z0289 Encounter for other administrative examinations: Secondary | ICD-10-CM | POA: Insufficient documentation

## 2013-06-01 DIAGNOSIS — R079 Chest pain, unspecified: Secondary | ICD-10-CM

## 2013-06-01 DIAGNOSIS — F1310 Sedative, hypnotic or anxiolytic abuse, uncomplicated: Secondary | ICD-10-CM

## 2013-06-01 DIAGNOSIS — F32A Depression, unspecified: Secondary | ICD-10-CM

## 2013-06-01 DIAGNOSIS — J4489 Other specified chronic obstructive pulmonary disease: Secondary | ICD-10-CM | POA: Insufficient documentation

## 2013-06-01 DIAGNOSIS — F17200 Nicotine dependence, unspecified, uncomplicated: Secondary | ICD-10-CM | POA: Insufficient documentation

## 2013-06-01 DIAGNOSIS — F3289 Other specified depressive episodes: Secondary | ICD-10-CM | POA: Insufficient documentation

## 2013-06-01 DIAGNOSIS — J449 Chronic obstructive pulmonary disease, unspecified: Secondary | ICD-10-CM | POA: Insufficient documentation

## 2013-06-01 DIAGNOSIS — F1020 Alcohol dependence, uncomplicated: Secondary | ICD-10-CM | POA: Diagnosis present

## 2013-06-01 DIAGNOSIS — Z823 Family history of stroke: Secondary | ICD-10-CM

## 2013-06-01 DIAGNOSIS — K219 Gastro-esophageal reflux disease without esophagitis: Secondary | ICD-10-CM | POA: Diagnosis present

## 2013-06-01 DIAGNOSIS — F10939 Alcohol use, unspecified with withdrawal, unspecified: Principal | ICD-10-CM | POA: Diagnosis present

## 2013-06-01 DIAGNOSIS — F1410 Cocaine abuse, uncomplicated: Secondary | ICD-10-CM | POA: Insufficient documentation

## 2013-06-01 DIAGNOSIS — R1013 Epigastric pain: Secondary | ICD-10-CM

## 2013-06-01 DIAGNOSIS — F319 Bipolar disorder, unspecified: Secondary | ICD-10-CM | POA: Diagnosis present

## 2013-06-01 DIAGNOSIS — F332 Major depressive disorder, recurrent severe without psychotic features: Secondary | ICD-10-CM | POA: Diagnosis present

## 2013-06-01 DIAGNOSIS — Z3202 Encounter for pregnancy test, result negative: Secondary | ICD-10-CM | POA: Insufficient documentation

## 2013-06-01 DIAGNOSIS — Z79899 Other long term (current) drug therapy: Secondary | ICD-10-CM | POA: Insufficient documentation

## 2013-06-01 DIAGNOSIS — F329 Major depressive disorder, single episode, unspecified: Secondary | ICD-10-CM

## 2013-06-01 DIAGNOSIS — F1920 Other psychoactive substance dependence, uncomplicated: Secondary | ICD-10-CM | POA: Diagnosis present

## 2013-06-01 DIAGNOSIS — F1420 Cocaine dependence, uncomplicated: Secondary | ICD-10-CM | POA: Diagnosis present

## 2013-06-01 DIAGNOSIS — G4700 Insomnia, unspecified: Secondary | ICD-10-CM | POA: Diagnosis present

## 2013-06-01 DIAGNOSIS — F1320 Sedative, hypnotic or anxiolytic dependence, uncomplicated: Secondary | ICD-10-CM | POA: Diagnosis present

## 2013-06-01 DIAGNOSIS — F10239 Alcohol dependence with withdrawal, unspecified: Principal | ICD-10-CM | POA: Diagnosis present

## 2013-06-01 LAB — LIPASE, BLOOD: Lipase: 39 U/L (ref 11–59)

## 2013-06-01 LAB — COMPREHENSIVE METABOLIC PANEL
ALT: 26 U/L (ref 0–35)
Albumin: 3.2 g/dL — ABNORMAL LOW (ref 3.5–5.2)
Alkaline Phosphatase: 91 U/L (ref 39–117)
BUN: 12 mg/dL (ref 6–23)
Calcium: 9.2 mg/dL (ref 8.4–10.5)
Chloride: 101 mEq/L (ref 96–112)
GFR calc non Af Amer: 63 mL/min — ABNORMAL LOW (ref >90)
Glucose, Bld: 113 mg/dL — ABNORMAL HIGH (ref 70–99)
Potassium: 3.5 mEq/L (ref 3.5–5.1)
Sodium: 137 mEq/L (ref 135–145)
Total Protein: 7 g/dL (ref 6.0–8.3)

## 2013-06-01 LAB — RAPID URINE DRUG SCREEN, HOSP PERFORMED
Barbiturates: NOT DETECTED
Benzodiazepines: POSITIVE — AB
Cocaine: POSITIVE — AB
Opiates: POSITIVE — AB
Tetrahydrocannabinol: NOT DETECTED

## 2013-06-01 LAB — CBC WITH DIFFERENTIAL/PLATELET
Basophils Absolute: 0.1 10*3/uL (ref 0.0–0.1)
Basophils Relative: 1 % (ref 0–1)
Eosinophils Absolute: 0.6 10*3/uL (ref 0.0–0.7)
Lymphs Abs: 3 10*3/uL (ref 0.7–4.0)
MCH: 32.9 pg (ref 26.0–34.0)
MCHC: 33.8 g/dL (ref 30.0–36.0)
Neutrophils Relative %: 54 % (ref 43–77)
Platelets: 251 10*3/uL (ref 150–400)
RBC: 4.32 MIL/uL (ref 3.87–5.11)
WBC: 9.5 10*3/uL (ref 4.0–10.5)

## 2013-06-01 LAB — POCT PREGNANCY, URINE: Preg Test, Ur: NEGATIVE

## 2013-06-01 MED ORDER — CHLORDIAZEPOXIDE HCL 25 MG PO CAPS
25.0000 mg | ORAL_CAPSULE | Freq: Four times a day (QID) | ORAL | Status: DC
  Administered 2013-06-01: 25 mg via ORAL

## 2013-06-01 MED ORDER — CLONIDINE HCL 0.1 MG PO TABS
0.1000 mg | ORAL_TABLET | ORAL | Status: AC
  Administered 2013-06-05 (×2): 0.1 mg via ORAL
  Filled 2013-06-01 (×4): qty 1

## 2013-06-01 MED ORDER — ALBUTEROL SULFATE (5 MG/ML) 0.5% IN NEBU
5.0000 mg | INHALATION_SOLUTION | Freq: Once | RESPIRATORY_TRACT | Status: AC
  Administered 2013-06-01: 5 mg via RESPIRATORY_TRACT
  Filled 2013-06-01: qty 1

## 2013-06-01 MED ORDER — CHLORDIAZEPOXIDE HCL 25 MG PO CAPS
25.0000 mg | ORAL_CAPSULE | Freq: Every day | ORAL | Status: AC
  Administered 2013-06-06: 25 mg via ORAL
  Filled 2013-06-01: qty 1

## 2013-06-01 MED ORDER — NAPROXEN 500 MG PO TABS: 500.0000 mg | ORAL_TABLET | Freq: Two times a day (BID) | ORAL | Status: DC | PRN

## 2013-06-01 MED ORDER — CHLORDIAZEPOXIDE HCL 25 MG PO CAPS: 25.0000 mg | ORAL_CAPSULE | ORAL | Status: DC

## 2013-06-01 MED ORDER — ADULT MULTIVITAMIN W/MINERALS CH: 1.0000 | ORAL_TABLET | Freq: Every day | ORAL | Status: DC

## 2013-06-01 MED ORDER — PANTOPRAZOLE SODIUM 40 MG PO TBEC: 40.0000 mg | DELAYED_RELEASE_TABLET | Freq: Every day | ORAL | Status: DC

## 2013-06-01 MED ORDER — MAGNESIUM HYDROXIDE 400 MG/5ML PO SUSP
30.0000 mL | Freq: Every day | ORAL | Status: DC | PRN
  Administered 2013-06-02: 30 mL via ORAL

## 2013-06-01 MED ORDER — THIAMINE HCL 100 MG/ML IJ SOLN: 100.0000 mg | Freq: Every day | INTRAMUSCULAR | Status: DC

## 2013-06-01 MED ORDER — DICYCLOMINE HCL 20 MG PO TABS: 20.0000 mg | ORAL_TABLET | Freq: Four times a day (QID) | ORAL | Status: DC | PRN

## 2013-06-01 MED ORDER — SODIUM CHLORIDE 0.9 % IV SOLN
1000.0000 mL | Freq: Once | INTRAVENOUS | Status: AC
  Administered 2013-06-01: 1000 mL via INTRAVENOUS

## 2013-06-01 MED ORDER — CHLORDIAZEPOXIDE HCL 25 MG PO CAPS
25.0000 mg | ORAL_CAPSULE | Freq: Three times a day (TID) | ORAL | Status: AC
  Administered 2013-06-03 – 2013-06-04 (×3): 25 mg via ORAL
  Filled 2013-06-01 (×3): qty 1

## 2013-06-01 MED ORDER — CHLORDIAZEPOXIDE HCL 25 MG PO CAPS
25.0000 mg | ORAL_CAPSULE | Freq: Four times a day (QID) | ORAL | Status: AC | PRN
  Administered 2013-06-04: 25 mg via ORAL
  Filled 2013-06-01: qty 1

## 2013-06-01 MED ORDER — NICOTINE 21 MG/24HR TD PT24
21.0000 mg | MEDICATED_PATCH | Freq: Every day | TRANSDERMAL | Status: DC
  Filled 2013-06-01: qty 1

## 2013-06-01 MED ORDER — CHLORDIAZEPOXIDE HCL 25 MG PO CAPS
25.0000 mg | ORAL_CAPSULE | ORAL | Status: AC
  Administered 2013-06-04 – 2013-06-05 (×2): 25 mg via ORAL
  Filled 2013-06-01 (×2): qty 1

## 2013-06-01 MED ORDER — LORAZEPAM 1 MG PO TABS
1.0000 mg | ORAL_TABLET | Freq: Once | ORAL | Status: AC
  Administered 2013-06-01: 1 mg via ORAL
  Filled 2013-06-01: qty 1

## 2013-06-01 MED ORDER — CHLORDIAZEPOXIDE HCL 25 MG PO CAPS: 25.0000 mg | ORAL_CAPSULE | Freq: Four times a day (QID) | ORAL | Status: DC | PRN

## 2013-06-01 MED ORDER — METHOCARBAMOL 500 MG PO TABS
500.0000 mg | ORAL_TABLET | Freq: Three times a day (TID) | ORAL | Status: DC | PRN
  Administered 2013-06-02 – 2013-06-06 (×7): 500 mg via ORAL
  Filled 2013-06-01 (×6): qty 1
  Filled 2013-06-01: qty 20
  Filled 2013-06-01: qty 1

## 2013-06-01 MED ORDER — CLONIDINE HCL 0.1 MG PO TABS: 0.1000 mg | ORAL_TABLET | Freq: Every day | ORAL | Status: DC

## 2013-06-01 MED ORDER — QUETIAPINE FUMARATE ER 50 MG PO TB24
150.0000 mg | ORAL_TABLET | Freq: Every day | ORAL | Status: DC
  Administered 2013-06-01 – 2013-06-02 (×2): 150 mg via ORAL
  Administered 2013-06-03 – 2013-06-04 (×2): 100 mg via ORAL
  Administered 2013-06-05: 150 mg via ORAL
  Filled 2013-06-01 (×2): qty 3
  Filled 2013-06-01: qty 42
  Filled 2013-06-01 (×5): qty 3

## 2013-06-01 MED ORDER — ONDANSETRON 4 MG PO TBDP: 4.0000 mg | ORAL_TABLET | Freq: Four times a day (QID) | ORAL | Status: AC | PRN

## 2013-06-01 MED ORDER — VITAMIN B-1 100 MG PO TABS: 100.0000 mg | ORAL_TABLET | Freq: Every day | ORAL | Status: DC

## 2013-06-01 MED ORDER — ACETAMINOPHEN 325 MG PO TABS: 650.0000 mg | ORAL_TABLET | Freq: Four times a day (QID) | ORAL | Status: DC | PRN

## 2013-06-01 MED ORDER — SODIUM CHLORIDE 0.9 % IV SOLN
1000.0000 mL | INTRAVENOUS | Status: DC
  Administered 2013-06-01: 1000 mL via INTRAVENOUS

## 2013-06-01 MED ORDER — AMLODIPINE BESYLATE 5 MG PO TABS
5.0000 mg | ORAL_TABLET | Freq: Every day | ORAL | Status: DC
  Administered 2013-06-01: 5 mg via ORAL
  Filled 2013-06-01: qty 1

## 2013-06-01 MED ORDER — CHLORDIAZEPOXIDE HCL 25 MG PO CAPS: 25.0000 mg | ORAL_CAPSULE | Freq: Three times a day (TID) | ORAL | Status: DC

## 2013-06-01 MED ORDER — ALUM & MAG HYDROXIDE-SIMETH 200-200-20 MG/5ML PO SUSP
30.0000 mL | ORAL | Status: DC | PRN
  Administered 2013-06-05: 30 mL via ORAL

## 2013-06-01 MED ORDER — ALBUTEROL SULFATE HFA 108 (90 BASE) MCG/ACT IN AERS: 2.0000 | INHALATION_SPRAY | Freq: Four times a day (QID) | RESPIRATORY_TRACT | Status: DC | PRN

## 2013-06-01 MED ORDER — VITAMIN B-1 100 MG PO TABS
100.0000 mg | ORAL_TABLET | Freq: Every day | ORAL | Status: DC
  Administered 2013-06-02 – 2013-06-06 (×5): 100 mg via ORAL
  Filled 2013-06-01 (×7): qty 1

## 2013-06-01 MED ORDER — ADULT MULTIVITAMIN W/MINERALS CH
1.0000 | ORAL_TABLET | Freq: Every day | ORAL | Status: DC
  Administered 2013-06-02 – 2013-06-06 (×5): 1 via ORAL
  Filled 2013-06-01 (×8): qty 1

## 2013-06-01 MED ORDER — ONDANSETRON 4 MG PO TBDP: 4.0000 mg | ORAL_TABLET | Freq: Four times a day (QID) | ORAL | Status: DC | PRN

## 2013-06-01 MED ORDER — ALBUTEROL SULFATE HFA 108 (90 BASE) MCG/ACT IN AERS
2.0000 | INHALATION_SPRAY | Freq: Four times a day (QID) | RESPIRATORY_TRACT | Status: DC | PRN
  Administered 2013-06-02 – 2013-06-04 (×2): 2 via RESPIRATORY_TRACT
  Filled 2013-06-01: qty 6.7

## 2013-06-01 MED ORDER — CHLORDIAZEPOXIDE HCL 25 MG PO CAPS: 25.0000 mg | ORAL_CAPSULE | Freq: Every day | ORAL | Status: DC

## 2013-06-01 MED ORDER — GABAPENTIN 300 MG PO CAPS
300.0000 mg | ORAL_CAPSULE | Freq: Once | ORAL | Status: DC
  Filled 2013-06-01 (×2): qty 1

## 2013-06-01 MED ORDER — PANTOPRAZOLE SODIUM 40 MG PO TBEC
40.0000 mg | DELAYED_RELEASE_TABLET | Freq: Every day | ORAL | Status: DC
  Administered 2013-06-02 – 2013-06-06 (×5): 40 mg via ORAL
  Filled 2013-06-01: qty 14
  Filled 2013-06-01 (×7): qty 1

## 2013-06-01 MED ORDER — THIAMINE HCL 100 MG/ML IJ SOLN
100.0000 mg | Freq: Every day | INTRAMUSCULAR | Status: DC
Start: 2013-06-02 — End: 2013-06-06

## 2013-06-01 MED ORDER — AMLODIPINE BESYLATE 5 MG PO TABS
5.0000 mg | ORAL_TABLET | Freq: Every day | ORAL | Status: DC
  Administered 2013-06-02 – 2013-06-04 (×3): 5 mg via ORAL
  Filled 2013-06-01 (×2): qty 1
  Filled 2013-06-01: qty 14
  Filled 2013-06-01 (×5): qty 1

## 2013-06-01 MED ORDER — LOPERAMIDE HCL 2 MG PO CAPS: 2.0000 mg | ORAL_CAPSULE | ORAL | Status: AC | PRN

## 2013-06-01 MED ORDER — MORPHINE SULFATE 4 MG/ML IJ SOLN
4.0000 mg | Freq: Once | INTRAMUSCULAR | Status: AC
  Administered 2013-06-01: 4 mg via INTRAVENOUS
  Filled 2013-06-01: qty 1

## 2013-06-01 MED ORDER — CLONIDINE HCL 0.1 MG PO TABS
0.1000 mg | ORAL_TABLET | Freq: Every day | ORAL | Status: AC
  Administered 2013-06-04 – 2013-06-06 (×2): 0.1 mg via ORAL
  Filled 2013-06-01 (×2): qty 1

## 2013-06-01 MED ORDER — CHLORDIAZEPOXIDE HCL 25 MG PO CAPS
25.0000 mg | ORAL_CAPSULE | Freq: Once | ORAL | Status: DC
  Filled 2013-06-01: qty 1

## 2013-06-01 MED ORDER — CHLORDIAZEPOXIDE HCL 25 MG PO CAPS
25.0000 mg | ORAL_CAPSULE | Freq: Once | ORAL | Status: AC
  Administered 2013-06-01: 25 mg via ORAL
  Filled 2013-06-01: qty 1

## 2013-06-01 MED ORDER — METHOCARBAMOL 500 MG PO TABS: 500.0000 mg | ORAL_TABLET | Freq: Three times a day (TID) | ORAL | Status: DC | PRN

## 2013-06-01 MED ORDER — DICYCLOMINE HCL 20 MG PO TABS
20.0000 mg | ORAL_TABLET | Freq: Four times a day (QID) | ORAL | Status: DC | PRN
  Administered 2013-06-01: 20 mg via ORAL
  Filled 2013-06-01: qty 1

## 2013-06-01 MED ORDER — LORAZEPAM 1 MG PO TABS: 0.0000 mg | ORAL_TABLET | Freq: Two times a day (BID) | ORAL | Status: DC

## 2013-06-01 MED ORDER — CLONIDINE HCL 0.1 MG PO TABS
0.1000 mg | ORAL_TABLET | Freq: Four times a day (QID) | ORAL | Status: AC
  Administered 2013-06-01 – 2013-06-03 (×8): 0.1 mg via ORAL
  Filled 2013-06-01 (×10): qty 1

## 2013-06-01 MED ORDER — QUETIAPINE FUMARATE ER 50 MG PO TB24
150.0000 mg | ORAL_TABLET | Freq: Every day | ORAL | Status: DC
  Filled 2013-06-01: qty 3

## 2013-06-01 MED ORDER — HYDROXYZINE HCL 25 MG PO TABS: 25.0000 mg | ORAL_TABLET | Freq: Four times a day (QID) | ORAL | Status: DC | PRN

## 2013-06-01 MED ORDER — NAPROXEN 500 MG PO TABS
500.0000 mg | ORAL_TABLET | Freq: Two times a day (BID) | ORAL | Status: DC | PRN
  Administered 2013-06-01: 500 mg via ORAL
  Filled 2013-06-01: qty 1

## 2013-06-01 MED ORDER — LORAZEPAM 1 MG PO TABS: 0.0000 mg | ORAL_TABLET | Freq: Four times a day (QID) | ORAL | Status: DC

## 2013-06-01 MED ORDER — CLONIDINE HCL 0.1 MG PO TABS
0.1000 mg | ORAL_TABLET | Freq: Four times a day (QID) | ORAL | Status: DC
  Administered 2013-06-01: 0.1 mg via ORAL
  Filled 2013-06-01: qty 1

## 2013-06-01 MED ORDER — CHLORDIAZEPOXIDE HCL 25 MG PO CAPS
25.0000 mg | ORAL_CAPSULE | Freq: Four times a day (QID) | ORAL | Status: AC
  Administered 2013-06-02 – 2013-06-03 (×5): 25 mg via ORAL
  Filled 2013-06-01 (×4): qty 1

## 2013-06-01 MED ORDER — LOPERAMIDE HCL 2 MG PO CAPS: 2.0000 mg | ORAL_CAPSULE | ORAL | Status: DC | PRN

## 2013-06-01 MED ORDER — CLONIDINE HCL 0.1 MG PO TABS: 0.1000 mg | ORAL_TABLET | ORAL | Status: DC

## 2013-06-01 NOTE — ED Notes (Signed)
Patient has one bag of belongings in locker 26. 

## 2013-06-01 NOTE — ED Notes (Signed)
Pt arrived to ED with a complaint of abdominal pain.  Pt admits to being a chronic alcoholic.  Pt states she is constantly intoxicated and has progressed from beer to liquor and wine.  Pt is complaining of abdominal pain when she stops drinking.  Pain is located midline radiating to the flanks of her abdomen. Pt is also a 2 pack a day smoker.  Pt does understand that her drinking could be a cause of her abdominal pain.  She has requested detox.
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
## 2013-06-01 NOTE — ED Notes (Signed)
Eric from [facility] called requesting we don't send this pt until at least 8:00 this evening.

## 2013-06-01 NOTE — ED Provider Notes (Signed)
CSN: [id number]     Arrival date & time 06/01/13  1243 History   First MD Initiated Contact with Patient 06/01/13 1330     Chief Complaint  Patient presents with  . Abdominal Pain  . Medical Clearance   (Consider location/radiation/quality/duration/timing/severity/associated sxs/prior Treatment) HPI Comments: 47 yo female with hx of ETOH abuse, asthma, COPD, depression/anxiety presents with c/o epigastric abd pain intermittently for 2 days. Worse when drinks ETOH, better with not drinking. Denies fever, chills, dysuria, N/V. Requesting detox  The history is provided by the patient.    Past Medical History  Diagnosis Date  . Asthma   . COPD (chronic obstructive pulmonary disease)   . Depression   . Anxiety    Past Surgical History  Procedure Laterality Date  . Tubal ligation     Family History  Problem Relation Age of Onset  . Stroke Mother    History  Substance Use Topics  . Smoking status: Current Every Day Smoker -- 1.50 packs/day  . Smokeless tobacco: Never Used  . Alcohol Use: Yes   OB History   Grav Para Term Preterm Abortions TAB SAB Ect Mult Living                 Review of Systems  Constitutional: Negative for fever.  HENT: Negative for rhinorrhea and sore throat.   Eyes: Negative for pain.  Respiratory: Negative for cough.   Cardiovascular: Negative for chest pain.  Gastrointestinal: Positive for abdominal pain. Negative for nausea and vomiting.  Genitourinary: Negative for dysuria.  Musculoskeletal: Negative for back pain.  Skin: Negative for rash.  Hematological: Negative for adenopathy.  Psychiatric/Behavioral: Negative for agitation.    Allergies  Review of patient's allergies indicates no known allergies.  Home Medications   Current Outpatient Rx  Name  Route  Sig  Dispense  Refill  . albuterol (PROVENTIL HFA;VENTOLIN HFA) 108 (90 BASE) MCG/ACT inhaler   Inhalation   Inhale 2 puffs into the lungs every 6 (six) hours as needed for  wheezing.   1 Inhaler   1   . amitriptyline (ELAVIL) 10 MG tablet   Oral   Take 1 tablet (10 mg total) by mouth at bedtime.   30 tablet   0   . amLODipine (NORVASC) 5 MG tablet   Oral   Take 1 tablet (5 mg total) by mouth daily.   30 tablet   0   . diazepam (VALIUM) 5 MG tablet   Oral   Take 1 tablet (5 mg total) by mouth 2 (two) times daily.   10 tablet   0   . hydrOXYzine (ATARAX/VISTARIL) 25 MG tablet   Oral   Take 2 tablets (50 mg total) by mouth 3 (three) times daily.   60 tablet   0   . ibuprofen (ADVIL,MOTRIN) 200 MG tablet   Oral   Take 400 mg by mouth every 6 (six) hours as needed for pain.         Marland Kitchen omeprazole (PRILOSEC) 40 MG capsule   Oral   Take 1 capsule (40 mg total) by mouth daily.   30 capsule   0   . QUEtiapine (SEROQUEL XR) 50 MG TB24 24 hr tablet   Oral   Take 3 tablets (150 mg total) by mouth at bedtime.   90 each   0    BP 141/96  Pulse 100  Temp(Src) 97.7 F (36.5 C) (Oral)  Resp 16  SpO2 96% Physical Exam  Constitutional: She is oriented  to person, place, and time. She appears well-developed and well-nourished.  HENT:  Head: Normocephalic and atraumatic.  Eyes: EOM are normal.  Neck: Normal range of motion. Neck supple.  Cardiovascular: Normal rate, regular rhythm, normal heart sounds and intact distal pulses.   Pulmonary/Chest: Effort normal. She has wheezes.  Abdominal: Soft. Bowel sounds are normal.  Musculoskeletal: Normal range of motion. She exhibits no edema.  Neurological: She is alert and oriented to person, place, and time.  Skin: Skin is warm and dry.  Psychiatric: She has a normal mood and affect.    ED Course  Procedures (including critical care time) Labs Review Labs Reviewed  COMPREHENSIVE METABOLIC PANEL - Abnormal; Notable for the following:    Glucose, Bld 113 (*)    Albumin 3.2 (*)    GFR calc non Af Amer 63 (*)    GFR calc Af Amer 73 (*)    All other components within normal limits  LIPASE, BLOOD   CBC WITH DIFFERENTIAL  POCT PREGNANCY, URINE   Imaging Review No results found.  EKG Interpretation    Date/Time:  Wednesday June 01 2013 16:35:30 EST Ventricular Rate:  89 PR Interval:  149 QRS Duration: 109 QT Interval:  395 QTC Calculation: 481 R Axis:   -52 Text Interpretation:  Sinus rhythm Abnormal R-wave progression, early transition No significant change since last tracing Confirmed by KOHUT  MD, STEPHEN ([id number]) on 06/01/2013 4:48:43 PM            MDM   1. Abdominal pain, epigastric   2. Alcohol abuse   3. Polysubstance abuse   4. Depressive disorder   5. Benzodiazepine abuse   6. Opiate abuse, continuous    47 yo female presents with epigastric abd pain. Pain is typical of her chronic pain that she has associated with recurrent N/V after ETOH use. No chest pain, no dyspnea. O2 sat 96% on RA indicating excellent oxygenation. Labs reveal normal lipase, no leukocytosis, normal LFT's  no evidence of pancreatitis nor acute cholecystitis, no emergent imaging indicated. Had relief in pain after morphine/zofran, consider gastritis related to ETOH use. Of note, she had an extensive cardiac workup last week for chest pain and epigastric pain, felt atypical for cardiac chest pain.  EKG today is unchanged from previous tracing. Asking for food/drink. TTS consulted as patient requesting detox. Will plan to move to psych holding and obtain psych consult for detox    Simmie Davies, NP 06/01/13 4098
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
## 2013-06-01 NOTE — Tx Team (Signed)
Initial Interdisciplinary Treatment Plan  PATIENT STRENGTHS: (choose at least two) General fund of knowledge  PATIENT STRESSORS: Health problems Substance abuse   PROBLEM LIST: Problem List/Patient Goals Date to be addressed Date deferred Reason deferred Estimated date of resolution  Substance abuse 06/02/13     Stomach pain 06/02/13                                                DISCHARGE CRITERIA:  Ability to meet basic life and health needs Improved stabilization in mood, thinking, and/or behavior Medical problems require only outpatient monitoring Motivation to continue treatment in a less acute level of care Need for constant or close observation no longer present Verbal commitment to aftercare and medication compliance Withdrawal symptoms are absent or subacute and managed without 24-hour nursing intervention  PRELIMINARY DISCHARGE PLAN: Attend 12-step recovery group Placement in alternative living arrangements  PATIENT/FAMIILY INVOLVEMENT: This treatment plan has been presented to and reviewed with the patient, Charlotte Fisher, and/or family member,  The patient and family have been given the opportunity to ask questions and make suggestions.  Beatrix Shipper 06/01/2013, 10:49 PM

## 2013-06-01 NOTE — Consult Note (Signed)
  Subjective:  Patient states "I drink every day; I'm drunk everyday; I drink about 1 paint, 3 Mad Dog 20 20's and Mikes Hard Lemonade.  I just hadn't drank anything since 3:00 this morning cause I had a meeting.  Yes, I get the pain pills off the street but they are for my pain.  My stomach swells up and I have Hep C but I have never told anyone and my stomach hurts.  I'm suppose to start going to a pain clinic.  Look at me I'm shacking all over."  Patient states that she has also tried heroin 4 times.  Patient does endorse depression and a previous hospitalization once for hearing voices about 2 yrs ago.  Patient states that she takes Seroquel for the voices but has been off for 6 months related to not having Medicaid.  States that she is not actively hearing voices and hasn't heard any voices in over a year. Patient denies suicide/homicide ideation, psychosis, and paranoia.  Explained to patient that she would not be getting any opiate pain medication with out having a prescription from a pain clinic or physician.    Axis I: Alcohol Abuse, Depressive Disorder NOS and Substance Abuse Axis II: Deferred Axis III:  Past Medical History  Diagnosis Date  . Asthma   . COPD (chronic obstructive pulmonary disease)   . Depression   . Anxiety    Axis IV: other psychosocial or environmental problems and problems related to social environment Axis V: 41-50 serious symptoms  Psychiatric Specialty Exam: Physical Exam  ROS  Blood pressure 141/96, pulse 100, temperature 97.7 F (36.5 C), temperature source Oral, resp. rate 16, SpO2 97.00%.There is no weight on file to calculate BMI.  General Appearance: Disheveled  Eye Contact::  Good  Speech:  Clear and Coherent and Normal Rate  Volume:  Normal  Mood:  Anxious and Depressed  Affect:  Congruent  Thought Process:  Circumstantial and Goal Directed  Orientation:  Full (Time, Place, and Person)  Thought Content:  Rumination  Suicidal Thoughts:  No   Homicidal Thoughts:  No  Memory:  Immediate;   Good Recent;   Good Remote;   Good  Judgement:  Poor  Insight:  Lacking  Psychomotor Activity:  Tremor  Concentration:  Fair  Recall:  Good  Akathisia:  No  Handed:  Right  AIMS (if indicated):     Assets:  Communication Skills Desire for Improvement  Sleep:      Face to face consult/interview and consulted with Dr. Elsie Saas  Disposition:  Inpatient detox and referral to rehab.  Start home medications along with Librium and Clonidine Protocols for alcohol/benzo/opiate detox.  Patient accepted to [facility] 305/01.    Shuvon B. Rankin FNP-BC Family Nurse Practitioner, Board Certified   Case discussed with her physician extender on the phone and reviewed the information documented and agree with the treatment plan.  Bryanah Sidell,JANARDHAHA R. 06/02/2013 9:10 AM

## 2013-06-02 ENCOUNTER — Encounter (HOSPITAL_COMMUNITY): Payer: Self-pay | Admitting: Psychiatry

## 2013-06-02 DIAGNOSIS — F1910 Other psychoactive substance abuse, uncomplicated: Secondary | ICD-10-CM

## 2013-06-02 MED ORDER — AMITRIPTYLINE HCL 10 MG PO TABS
10.0000 mg | ORAL_TABLET | Freq: Every day | ORAL | Status: DC
  Filled 2013-06-02: qty 1

## 2013-06-02 MED ORDER — IBUPROFEN 200 MG PO TABS: 400.0000 mg | ORAL_TABLET | Freq: Four times a day (QID) | ORAL | Status: DC | PRN

## 2013-06-02 MED ORDER — AMITRIPTYLINE HCL 25 MG PO TABS
25.0000 mg | ORAL_TABLET | Freq: Every day | ORAL | Status: DC
  Administered 2013-06-02 – 2013-06-05 (×4): 25 mg via ORAL
  Filled 2013-06-02 (×6): qty 1

## 2013-06-02 MED ORDER — LIDOCAINE 5 % EX PTCH
1.0000 | MEDICATED_PATCH | Freq: Every day | CUTANEOUS | Status: DC
  Administered 2013-06-02 – 2013-06-06 (×5): 1 via TRANSDERMAL
  Filled 2013-06-02 (×3): qty 1
  Filled 2013-06-02: qty 14
  Filled 2013-06-02 (×3): qty 1

## 2013-06-02 MED ORDER — NICOTINE 21 MG/24HR TD PT24
21.0000 mg | MEDICATED_PATCH | Freq: Every day | TRANSDERMAL | Status: DC
  Administered 2013-06-03 – 2013-06-06 (×4): 21 mg via TRANSDERMAL
  Filled 2013-06-02 (×7): qty 1

## 2013-06-02 MED ORDER — GABAPENTIN 300 MG PO CAPS
300.0000 mg | ORAL_CAPSULE | Freq: Three times a day (TID) | ORAL | Status: DC
  Administered 2013-06-02 – 2013-06-03 (×3): 300 mg via ORAL
  Filled 2013-06-02 (×6): qty 1

## 2013-06-02 MED ORDER — IBUPROFEN 800 MG PO TABS
800.0000 mg | ORAL_TABLET | Freq: Four times a day (QID) | ORAL | Status: DC | PRN
  Administered 2013-06-02 – 2013-06-06 (×7): 800 mg via ORAL
  Filled 2013-06-02 (×7): qty 1

## 2013-06-02 NOTE — Progress Notes (Signed)
Recreation Therapy Notes  Date: 12.04.2014 Time: 3:00pm Location: [address] Dayroom   Group Topic: Communication, Team Building, Problem Solving  Goal Area(s) Addresses:  Patient will effectively work with peer towards shared goal.  Patient will identify skill used to make activity successful.  Patient will identify how skills used during activity can be used to reach post d/c goals.   Behavioral Response: Did not attend.   Marykay Lex Willford Rabideau, LRT/CTRS  Jearl Klinefelter 06/02/2013 3:53 PM
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
## 2013-06-02 NOTE — Progress Notes (Signed)
[facility] Group Notes:  (Nursing/MHT/Case Management/Adjunct)  Date:  06/02/2013  Time:  9:50 AM  Type of Therapy:  Nurse Education  Participation Level:  Did Not Attend  Summary of Progress/Problems: Pt was encouraged to come to group but pt stayed in bed.  Cathlean Cower 06/02/2013, 9:50 AM

## 2013-06-02 NOTE — Progress Notes (Signed)
Patient ID: Charlotte Fisher, female   DOB: 1965/07/25, 47 y.o.   MRN: [id number] Pt did not attend group. She was in bed.
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
## 2013-06-02 NOTE — Progress Notes (Signed)
Patient did not attend the evening karaoke group. Pt remained in her bed sleeping during group time. Pt reported needing to speak to doctor about taking less seraquel because she has been sleeping all through out the day.

## 2013-06-02 NOTE — [facility] Group Notes (Signed)
[facility] LCSW Group Therapy  06/02/2013 1:15 PM   Type of Therapy:  Group Therapy  Participation Level:  Did Not Attend - pt was asleep in their room for the duration of group   Marlyn Rabine Horton, LCSW 06/02/2013 2:12 PM   

## 2013-06-02 NOTE — Progress Notes (Signed)
D: Patient resting in bed with eyes closed.  Respirations even and unlabored.  Patient appears to be in no apparent distress. A: Staff to monitor Q 15 mins for safety.   R:Patient remains safe on the unit.  

## 2013-06-02 NOTE — Progress Notes (Addendum)
D:  Patient has been in bed much of the shift.  She has been up a few times for short periods of time.  She requested another lidocaine patch, states she wants 3 of them.  Also states she has not had a BM in about a month and requested a laxative.  Also complained of shortness of breath and requested an inhaler. A:  Medications given as prescribed.  Educated patient about medications and what she was able to have.  She was given MOM 30 mL and instructed to speak with the provider in the morning if she did not have a bowel movement.  Encouraged fluids, Gatorade given.  Listened to patients lungs, positive inspiratory wheezing bilaterally.  Given Albuterol inhaler to use.  Patient took the inhaler and hit it 5 times before I was able to stop her.  She was instructed to use it only 2 times or nursing staff would hold it and deliver the medication to her.   R:  Spent most of the evening in her room.  She did verbalize understanding of medications, but it is doubtful she actually understands what she can take and why.  States she always hits the inhaler 4-5 times when she is feeling short of breath.  Does not understand why she is unable to do that here, but did state she would comply with the rules. Safety maintained.

## 2013-06-03 DIAGNOSIS — F1310 Sedative, hypnotic or anxiolytic abuse, uncomplicated: Secondary | ICD-10-CM

## 2013-06-03 DIAGNOSIS — F1010 Alcohol abuse, uncomplicated: Secondary | ICD-10-CM

## 2013-06-03 DIAGNOSIS — F1110 Opioid abuse, uncomplicated: Secondary | ICD-10-CM

## 2013-06-03 MED ORDER — CITALOPRAM HYDROBROMIDE 10 MG PO TABS
10.0000 mg | ORAL_TABLET | Freq: Every day | ORAL | Status: DC
  Administered 2013-06-03 – 2013-06-06 (×4): 10 mg via ORAL
  Filled 2013-06-03 (×2): qty 1
  Filled 2013-06-03: qty 14
  Filled 2013-06-03 (×3): qty 1

## 2013-06-03 MED ORDER — HYDROXYZINE HCL 25 MG PO TABS
25.0000 mg | ORAL_TABLET | Freq: Four times a day (QID) | ORAL | Status: DC | PRN
  Administered 2013-06-03 – 2013-06-05 (×4): 25 mg via ORAL
  Filled 2013-06-03 (×4): qty 1
  Filled 2013-06-03: qty 30

## 2013-06-03 MED ORDER — IPRATROPIUM-ALBUTEROL 20-100 MCG/ACT IN AERS: 1.0000 | INHALATION_SPRAY | Freq: Four times a day (QID) | RESPIRATORY_TRACT | Status: DC | PRN

## 2013-06-03 MED ORDER — MAGNESIUM CITRATE PO SOLN
1.0000 | Freq: Once | ORAL | Status: AC
  Administered 2013-06-03: 1 via ORAL

## 2013-06-03 MED ORDER — TIOTROPIUM BROMIDE MONOHYDRATE 18 MCG IN CAPS
18.0000 ug | ORAL_CAPSULE | Freq: Every day | RESPIRATORY_TRACT | Status: DC
  Administered 2013-06-03 – 2013-06-06 (×4): 18 ug via RESPIRATORY_TRACT
  Filled 2013-06-03: qty 5

## 2013-06-03 MED ORDER — GABAPENTIN 400 MG PO CAPS
800.0000 mg | ORAL_CAPSULE | Freq: Three times a day (TID) | ORAL | Status: DC
  Administered 2013-06-03 – 2013-06-06 (×10): 800 mg via ORAL
  Filled 2013-06-03 (×15): qty 2

## 2013-06-03 MED ORDER — DOCUSATE SODIUM 100 MG PO CAPS
100.0000 mg | ORAL_CAPSULE | Freq: Two times a day (BID) | ORAL | Status: DC
  Administered 2013-06-03 – 2013-06-06 (×7): 100 mg via ORAL
  Filled 2013-06-03 (×2): qty 28
  Filled 2013-06-03 (×10): qty 1

## 2013-06-03 MED ORDER — BISACODYL 5 MG PO TBEC
5.0000 mg | DELAYED_RELEASE_TABLET | Freq: Every day | ORAL | Status: DC | PRN
  Administered 2013-06-04: 5 mg via ORAL
  Filled 2013-06-03: qty 1

## 2013-06-03 NOTE — [facility] Group Notes (Signed)
[facility] LCSW Group Therapy  06/03/2013 1:15 PM   Type of Therapy:  Group Therapy  Participation Level:  Did Not Attend - pt was asleep in their room  Shakima Nisley Horton, LCSW 06/03/2013 2:42 PM   

## 2013-06-03 NOTE — [facility] Group Notes (Signed)
Adult Psychoeducational Group Note  Date:  06/03/2013 Time:  10:18 PM  Group Topic/Focus:  AA Meeting  Participation Level:  Active  Participation Quality:  Appropriate  Affect:  Appropriate  Cognitive:  Appropriate  Insight: Appropriate  Engagement in Group:  Engaged  Modes of Intervention:  Discussion and Education  Additional Comments:  Princella attended [employer].  Caroll Rancher A 06/03/2013, 10:18 PM
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
## 2013-06-03 NOTE — BHH Group Notes (Signed)
[facility] LCSW Aftercare Discharge Planning Group Note   06/03/2013 8:45 AM  Participation Quality:  Alert and Appropriate   Mood/Affect:  Disheveled, Anxious  Depression Rating:  5  Anxiety Rating:  8  Thoughts of Suicide:  Pt denies SI/HI  Will you contract for safety?   Yes  Current AVH:  Pt denies  Plan for Discharge/Comments:  Pt attended discharge planning group and actively participated in group.  CSW provided pt with today's workbook.  Pt reports coming to the hospital for alcohol detox.  Pt states that she is homeless in [place] and wants further inpatient treatment after detox and discharge from here.  CSW will assess for appropriate referrals.  No further needs voiced by pt at this time.    Transportation Means: Pt denies access to transportation  Supports: No supports mentioned at this time  Reyes Ivan, LCSW 06/03/2013 10:36 AM

## 2013-06-03 NOTE — Progress Notes (Signed)
Adult Psychoeducational Group Note  Date:  06/03/2013 Time:  3:11 PM  Group Topic/Focus:  Early Warning Signs:   The focus of this group is to help patients identify signs or symptoms they exhibit before slipping into an unhealthy state or crisis.  Participation Level:  Minimal  Participation Quality:  Attentive  Affect:  Flat  Cognitive:  Alert  Insight: Good  Engagement in Group:  Engaged  Modes of Intervention:  Activity, Discussion, Education, Exploration, Socialization and Support  Additional Comments:  Pt came to group and shared that being around negative people and not caring about life are her two early warning signs for relapse. Pt left group before the discussion of coping skills.   Cathlean Cower 06/03/2013, 3:11 PM

## 2013-06-03 NOTE — Progress Notes (Signed)
[facility] MD Progress Note  06/03/2013 3:22 PM Charlotte Fisher  MRN:  [id number] Subjective:  Charlotte Fisher continues to have a hard time. She is having multiple physical complains. She is also admits to mood instability. She has been abusing multiple substances and understands she is going trough some withdrawal. She would like her medications adjusted as still feels she is "not right." she was on a higher dose of neurontin. She feels the Seroquel is too strong, but admits she has a hard time with sleep. She would want something else for anxiety (states that nothing work as well as benzodiazepines) Diagnosis:   DSM5: Schizophrenia Disorders:  none Obsessive-Compulsive Disorders:  none Trauma-Stressor Disorders:  none Substance/Addictive Disorders:  Polysubstance Dependence Depressive Disorders:  Major Depressive Disorder - Moderate (296.22)  Axis I: Anxiety Disorder NOS, Mood Disorder NOS and Substance Induced Mood Disorder  ADL's:  Intact  Sleep: Fair  Appetite:  Fair  Suicidal Ideation:  Plan:  denies Intent:  denies Means:  denies Homicidal Ideation:  Plan:  denies Intent:  denies Means:  denies AEB (as evidenced by):  Psychiatric Specialty Exam: Review of Systems  Constitutional: Positive for malaise/fatigue.  HENT: Negative.   Eyes: Negative.   Respiratory: Positive for cough and shortness of breath.   Cardiovascular: Negative.   Gastrointestinal: Positive for constipation.  Genitourinary: Negative.   Musculoskeletal: Positive for joint pain.  Skin: Negative.   Neurological: Positive for weakness.  Endo/Heme/Allergies: Negative.   Psychiatric/Behavioral: Positive for depression and substance abuse. The patient is nervous/anxious.     Blood pressure 105/79, pulse 81, temperature 98 F (36.7 C), temperature source Oral, resp. rate 16, height 5\' 1"  (1.549 m), weight 79.833 kg (176 lb).Body mass index is 33.27 kg/(m^2).  General Appearance: Disheveled  Eye Solicitor::  Fair  Speech:   Clear and Coherent and rapid  Volume:  Increased  Mood:  Anxious, Dysphoric and Irritable  Affect:  Labile  Thought Process:  Coherent and Goal Directed  Orientation:  Full (Time, Place, and Person)  Thought Content:  symptoms, worries, concerns  Suicidal Thoughts:  No  Homicidal Thoughts:  No  Memory:  Immediate;   Fair Recent;   Fair Remote;   Fair  Judgement:  Fair  Insight:  Present  Psychomotor Activity:  Restlessness  Concentration:  Fair  Recall:  Fair  Akathisia:  No  Handed:    AIMS (if indicated):     Assets:  Desire for Improvement  Sleep:  Number of Hours: 6.5   Current Medications: Current Facility-Administered Medications  Medication Dose Route Frequency Provider Last Rate Last Dose  . albuterol (PROVENTIL HFA;VENTOLIN HFA) 108 (90 BASE) MCG/ACT inhaler 2 puff  2 puff Inhalation Q6H PRN Shuvon Rankin, NP   2 puff at 06/02/13 2108  . alum & mag hydroxide-simeth (MAALOX/MYLANTA) 200-200-20 MG/5ML suspension 30 mL  30 mL Oral Q4H PRN Shuvon Rankin, NP      . amitriptyline (ELAVIL) tablet 25 mg  25 mg Oral QHS Sanjuana Kava, NP   25 mg at 06/02/13 2115  . amLODipine (NORVASC) tablet 5 mg  5 mg Oral Daily Shuvon Rankin, NP   5 mg at 06/03/13 0830  . bisacodyl (DULCOLAX) EC tablet 5 mg  5 mg Oral Daily PRN Rachael Fee, MD      . chlordiazePOXIDE (LIBRIUM) capsule 25 mg  25 mg Oral Q6H PRN Shuvon Rankin, NP      . chlordiazePOXIDE (LIBRIUM) capsule 25 mg  25 mg Oral TID Assunta Found, NP  25 mg at 06/03/13 1148   Followed by  . [START ON 06/04/2013] chlordiazePOXIDE (LIBRIUM) capsule 25 mg  25 mg Oral BH-qamhs Shuvon Rankin, NP       Followed by  . [START ON 06/06/2013] chlordiazePOXIDE (LIBRIUM) capsule 25 mg  25 mg Oral Daily Shuvon Rankin, NP      . citalopram (CELEXA) tablet 10 mg  10 mg Oral Daily Rachael Fee, MD   10 mg at 06/03/13 1147  . cloNIDine (CATAPRES) tablet 0.1 mg  0.1 mg Oral QID Shuvon Rankin, NP   0.1 mg at 06/03/13 1148   Followed by  . [START ON  06/04/2013] cloNIDine (CATAPRES) tablet 0.1 mg  0.1 mg Oral BH-qamhs Shuvon Rankin, NP       Followed by  . [START ON 06/06/2013] cloNIDine (CATAPRES) tablet 0.1 mg  0.1 mg Oral QAC breakfast Shuvon Rankin, NP      . dicyclomine (BENTYL) tablet 20 mg  20 mg Oral Q6H PRN Shuvon Rankin, NP      . docusate sodium (COLACE) capsule 100 mg  100 mg Oral BID Rachael Fee, MD   100 mg at 06/03/13 1148  . gabapentin (NEURONTIN) capsule 800 mg  800 mg Oral TID Rachael Fee, MD   800 mg at 06/03/13 1147  . hydrOXYzine (ATARAX/VISTARIL) tablet 25 mg  25 mg Oral Q6H PRN Rachael Fee, MD      . ibuprofen (ADVIL,MOTRIN) tablet 800 mg  800 mg Oral Q6H PRN Sanjuana Kava, NP   800 mg at 06/03/13 4098  . Ipratropium-Albuterol (COMBIVENT) respimat 1 puff  1 puff Inhalation Q6H PRN Rachael Fee, MD      . lidocaine (LIDODERM) 5 % 1 patch  1 patch Transdermal Q breakfast Sanjuana Kava, NP   1 patch at 06/03/13 0830  . loperamide (IMODIUM) capsule 2-4 mg  2-4 mg Oral PRN Shuvon Rankin, NP      . magnesium hydroxide (MILK OF MAGNESIA) suspension 30 mL  30 mL Oral Daily PRN Shuvon Rankin, NP   30 mL at 06/02/13 2119  . methocarbamol (ROBAXIN) tablet 500 mg  500 mg Oral Q8H PRN Shuvon Rankin, NP   500 mg at 06/02/13 2119  . multivitamin with minerals tablet 1 tablet  1 tablet Oral Daily Shuvon Rankin, NP   1 tablet at 06/03/13 0830  . nicotine (NICODERM CQ - dosed in mg/24 hours) patch 21 mg  21 mg Transdermal Daily Rachael Fee, MD   21 mg at 06/03/13 1191  . ondansetron (ZOFRAN-ODT) disintegrating tablet 4 mg  4 mg Oral Q6H PRN Shuvon Rankin, NP      . pantoprazole (PROTONIX) EC tablet 40 mg  40 mg Oral Daily Shuvon Rankin, NP   40 mg at 06/03/13 0830  . QUEtiapine (SEROQUEL XR) 24 hr tablet 150 mg  150 mg Oral QHS Shuvon Rankin, NP   150 mg at 06/02/13 2115  . thiamine (VITAMIN B-1) tablet 100 mg  100 mg Oral Daily Shuvon Rankin, NP   100 mg at 06/03/13 4782   Or  . thiamine (B-1) injection 100 mg  100 mg Intravenous  Daily Shuvon Rankin, NP      . tiotropium (SPIRIVA) inhalation capsule 18 mcg  18 mcg Inhalation Daily Rachael Fee, MD        Lab Results:  Results for orders placed during the hospital encounter of 06/01/13 (from the past 48 hour(s))  URINE RAPID DRUG SCREEN (HOSP PERFORMED)  Status: Abnormal   Collection Time    06/01/13  3:33 PM      Result Value Range   Opiates POSITIVE (*) NONE DETECTED   Cocaine POSITIVE (*) NONE DETECTED   Benzodiazepines POSITIVE (*) NONE DETECTED   Amphetamines NONE DETECTED  NONE DETECTED   Tetrahydrocannabinol NONE DETECTED  NONE DETECTED   Barbiturates NONE DETECTED  NONE DETECTED   Comment:            DRUG SCREEN FOR MEDICAL PURPOSES     ONLY.  IF CONFIRMATION IS NEEDED     FOR ANY PURPOSE, NOTIFY LAB     WITHIN 5 DAYS.                LOWEST DETECTABLE LIMITS     FOR URINE DRUG SCREEN     Drug Class       Cutoff (ng/mL)     Amphetamine      1000     Barbiturate      200     Benzodiazepine   200     Tricyclics       300     Opiates          300     Cocaine          300     THC              50  ETHANOL     Status: None   Collection Time    06/01/13  4:05 PM      Result Value Range   Alcohol, Ethyl (B) <11  0 - 11 mg/dL   Comment:            LOWEST DETECTABLE LIMIT FOR     SERUM ALCOHOL IS 11 mg/dL     FOR MEDICAL PURPOSES ONLY    Physical Findings: AIMS: Facial and Oral Movements Muscles of Facial Expression: None, normal Lips and Perioral Area: None, normal Jaw: None, normal Tongue: None, normal,Extremity Movements Upper (arms, wrists, hands, fingers): None, normal Lower (legs, knees, ankles, toes): None, normal, Trunk Movements Neck, shoulders, hips: None, normal, Overall Severity Severity of abnormal movements (highest score from questions above): None, normal Incapacitation due to abnormal movements: None, normal Patient's awareness of abnormal movements (rate only patient's report): No Awareness, Dental Status Current  problems with teeth and/or dentures?: No Does patient usually wear dentures?: No  CIWA:  CIWA-Ar Total: 6 COWS:  COWS Total Score: 4  Treatment Plan Summary: Daily contact with patient to assess and evaluate symptoms and progress in treatment Medication management  Plan: Supportive approach/coping skills/relapse prevention           Continue detox protocols; Librium, Clonidine           Optimize treatment with psychotropics            CBT;mindfulness  Medical Decision Making Problem Points:  Review of psycho-social stressors (1) Data Points:  Review of medication regiment & side effects (2) Review of new medications or change in dosage (2)  I certify that inpatient services furnished can reasonably be expected to improve the patient's condition.   Breella Vanostrand A 06/03/2013, 3:22 PM

## 2013-06-03 NOTE — Tx Team (Signed)
Interdisciplinary Treatment Plan Update (Adult)  Date: 06/03/2013  Time Reviewed:  9:45 AM  Progress in Treatment: Attending groups: Yes Participating in groups:  Yes Taking medication as prescribed:  Yes Tolerating medication:  Yes Family/Significant othe contact made: CSW assessing Patient understands diagnosis:  Yes Discussing patient identified problems/goals with staff:  Yes Medical problems stabilized or resolved:  Yes Denies suicidal/homicidal ideation: Yes Issues/concerns per patient self-inventory:  Yes Other:  New problem(s) identified: N/A  Discharge Plan or Barriers: CSW assessing for appropriate referrals.    Reason for Continuation of Hospitalization: Anxiety Depression Medication Stabilization Detox  Comments: N/A  Estimated length of stay: 3-5 days  For review of initial/current patient goals, please see plan of care.  Attendees: Patient:     Family:     Physician:  Dr. Lugo 06/03/2013 10:06 AM   Nursing:   Linsey Squires, RN 06/03/2013 10:06 AM   Clinical Social Worker:  Viral Schramm Horton, LCSW 06/03/2013 10:06 AM   Other: Jennifer Clark, RN case manager 06/03/2013 10:06 AM   Other:    Other:    Other:     Other:    Other:    Other:    Other:    Other:    Other:     Scribe for Treatment Team:   Horton, Thamara Leger Nicole, 06/03/2013 , 10:06 AM  

## 2013-06-03 NOTE — BHH Suicide Risk Assessment (Signed)
BHH INPATIENT: Family/Significant Other Suicide Prevention Education   Suicide Prevention Education:  Education Completed; No one has been identified by the patient as the family member/significant other with whom the patient will be residing, and identified as the person(s) who will aid the patient in the event of a mental health crisis (suicidal ideations/suicide attempt).   Pt did not c/o SI at admission, nor have they endorsed SI during their stay here. SPE not required. SPI pamphlet provided to pt and he was encouraged to share information with his support network, ask questions, and talk about any concerns.   The suicide prevention education provided includes the following:  Suicide risk factors  Suicide prevention and interventions  National Suicide Hotline telephone number  [facility] assessment telephone number  [facility] Emergency Assistance 911  [facility] and/or Residential Mobile Crisis Unit telephone number  Charlotte Fisher, [place] 06/03/2013  9:35 AM

## 2013-06-03 NOTE — Progress Notes (Signed)
Patient ID: Charlotte Fisher, female   DOB: [birth year], 47 y.o.   MRN: [id number]  D: Patient needy when speaking with her tonight. Has various requests. Reports that she is better since being here. Withdrawals are minimal at present. Craving cigarettes mostly tonight. Only wanting 100mg  of seroquel instead of the whole 150mg . Reports that the physician was suppose to decrease that today when he added the celexa. States the 150mg  of seroquel makes her too sedated in the am. A: Staff will monitor on q 15 minute checks, follow treatment plan, and give meds as ordered. R: Cooperative on the unit.

## 2013-06-03 NOTE — Progress Notes (Signed)
Patient ID: Charlotte Fisher, female   DOB: 1965-07-19, 47 y.o.   MRN: [id number] D. Patient presents with anxious mood, affect congruent. She has been very intrusive, with multiple requests throughout shift thus far. She states '' You have my medications, listen , I need something for withdrawals, and I need my inhaler, and I haven't pooped in a month and the other nurse gave me medication to help me go and it hasn't worked. I need to stay in my room today, all of my medications haven't adjusted where I'm getting back on there so I need to lay down. I'm not going to be able to go to group'' Pt refused to complete self inventory today. A. Patient encouraged to attend unit programming, medications given as ordered. Support and encouragement provided. Discussed above information with MD/Treatment team. R. Patient has remained isolative to room throughout most of shift thus far. No further voiced concerns at this time. Will continue to monitor q 15 minutes for safety.

## 2013-06-04 DIAGNOSIS — F321 Major depressive disorder, single episode, moderate: Secondary | ICD-10-CM

## 2013-06-04 DIAGNOSIS — F411 Generalized anxiety disorder: Secondary | ICD-10-CM

## 2013-06-04 DIAGNOSIS — F1920 Other psychoactive substance dependence, uncomplicated: Secondary | ICD-10-CM

## 2013-06-04 DIAGNOSIS — F39 Unspecified mood [affective] disorder: Secondary | ICD-10-CM

## 2013-06-04 DIAGNOSIS — F1994 Other psychoactive substance use, unspecified with psychoactive substance-induced mood disorder: Secondary | ICD-10-CM

## 2013-06-04 NOTE — Progress Notes (Signed)
Patient ID: Charlotte Fisher, female   DOB: Jul 31, 1965, 47 y.o.   MRN: [id number] D. Patient presents with anxious, hypomanic mood, affect congruent.Circumstantial speech. She has been very intrusive,and disruptive during unit programming, with multiple requests throughout shift thus far. She states '' I needed my medications back started because a mental patient can't be without their medications or they can't be held accountable for what they do. And also, I need more medications to help me poop because I want to get everything cleaned out and Also I want to know what medications I will be going home on, like that Robaxin, I have pain and I'll need that too'' Pt completed self inventory and reports feeling depressed, and hopeless. A. Patient encouraged to attend unit programming, medications given as ordered. Support and encouragement provided. Discussed above information with MD/Treatment team. R. Patient has been more interactive on the unit. No further voiced concerns at this time. Will continue to monitor q 15 minutes for safety.

## 2013-06-04 NOTE — BHH Group Notes (Addendum)
B[facility] Group Notes:  (Clinical Social Work)  06/04/2013     10-11AM  Summary of Progress/Problems:   The main focus of today's process group was for the patient to identify ways in which they have in the past sabotaged their own recovery. Motivational Interviewing was utilized to ask the group members what they get out of their substance use, and what reasons they may have for wanting to change.  The Stages of Change were explained using a handout, and patients identified where they currently are with regard to stages of change.  The patient expressed that she has to change her drinking, that there is no option.  She was up and down several times, in and out of room and asked distracting, unrelated questions at times.  Type of Therapy:  Group Therapy - Process   Participation Level:  Active  Participation Quality:  Intrusive and Inattentive  Affect:  Blunted and Defensive  Cognitive:  Disorganized  Insight:  Distracting  Engagement in Therapy:  Limited  Modes of Intervention:  Education, Support and Processing, Motivational Interviewing  Ambrose Mantle, LCSW 06/04/2013, 3:02 PM

## 2013-06-04 NOTE — BHH Group Notes (Signed)
[facility] Group Notes:  (Nursing/MHT/Case Management/Adjunct)  Date:  06/04/2013  Time:  11:05 AM  Type of Therapy:  Psychoeducational Skills  Participation Level:  Active  Participation Quality:  Redirectable  Affect:  Anxious, Excited and Labile  Cognitive:  Disorganized  Insight:  Lacking  Engagement in Group:  Distracting  Modes of Intervention:  Confrontation, Discussion and Education  Summary of Progress/Problems:Self inventory review and healthy coping skill review with RN.   Malva Limes 06/04/2013, 11:05 AM

## 2013-06-04 NOTE — Progress Notes (Signed)
Patient ID: Charlotte Fisher, female   DOB: 1966/03/11, 47 y.o.   MRN: [id number] [facility] MD Progress Note  06/04/2013 4:38 PM Charlotte Fisher  MRN:  [id number] Subjective:  [name] states "that she feels dizzy all day, my medications are causing this". She reports resting well last night. She also notes a decrease in her withdrawal symtpoms, detox treatment is going fairly well. Upon discharge she is hoping to get into [facility]. Denies any SI/HI/AVH.  Diagnosis:   DSM5: Schizophrenia Disorders:  none Obsessive-Compulsive Disorders:  none Trauma-Stressor Disorders:  none Substance/Addictive Disorders:  Polysubstance Dependence Depressive Disorders:  Major Depressive Disorder - Moderate (296.22)  Axis I: Anxiety Disorder NOS, Mood Disorder NOS and Substance Induced Mood Disorder  ADL's:  Intact  Sleep: Fair  Appetite:  Fair  Suicidal Ideation:  Plan:  denies Intent:  denies Means:  denies Homicidal Ideation:  Plan:  denies Intent:  denies Means:  denies AEB (as evidenced by):  Psychiatric Specialty Exam: Review of Systems  Constitutional: Positive for malaise/fatigue.  HENT: Negative.   Eyes: Negative.   Respiratory: Positive for cough and shortness of breath.   Cardiovascular: Negative.   Gastrointestinal: Positive for constipation.  Genitourinary: Negative.   Musculoskeletal: Positive for joint pain.  Skin: Negative.   Neurological: Positive for weakness.  Endo/Heme/Allergies: Negative.   Psychiatric/Behavioral: Positive for depression and substance abuse. The patient is nervous/anxious.     Blood pressure 94/65, pulse 87, temperature 97.9 F (36.6 C), temperature source Oral, resp. rate 20, height 5\' 1"  (1.549 m), weight 79.833 kg (176 lb).Body mass index is 33.27 kg/(m^2).  General Appearance: Casual  Eye Contact::  Fair  Speech:  Clear and Coherent  Volume:  Normal  Mood:  Dysphoric  Affect:  Depressed  Thought Process:  Coherent and Goal Directed  Orientation:  Full (Time,  Place, and Person)  Thought Content:  symptoms, worries, concerns  Suicidal Thoughts:  No  Homicidal Thoughts:  No  Memory:  Immediate;   Fair Recent;   Fair Remote;   Fair  Judgement:  Fair  Insight:  Present  Psychomotor Activity:  Restlessness  Concentration:  Fair  Recall:  Fair  Akathisia:  No  Handed:    AIMS (if indicated):     Assets:  Desire for Improvement  Sleep:  Number of Hours: 6   Current Medications: Current Facility-Administered Medications  Medication Dose Route Frequency Provider Last Rate Last Dose  . albuterol (PROVENTIL HFA;VENTOLIN HFA) 108 (90 BASE) MCG/ACT inhaler 2 puff  2 puff Inhalation Q6H PRN Shuvon Rankin, NP   2 puff at 06/04/13 1306  . alum & mag hydroxide-simeth (MAALOX/MYLANTA) 200-200-20 MG/5ML suspension 30 mL  30 mL Oral Q4H PRN Shuvon Rankin, NP      . amitriptyline (ELAVIL) tablet 25 mg  25 mg Oral QHS Sanjuana Kava, NP   25 mg at 06/03/13 2204  . amLODipine (NORVASC) tablet 5 mg  5 mg Oral Daily Shuvon Rankin, NP   5 mg at 06/04/13 0820  . bisacodyl (DULCOLAX) EC tablet 5 mg  5 mg Oral Daily PRN Rachael Fee, MD   5 mg at 06/04/13 1120  . chlordiazePOXIDE (LIBRIUM) capsule 25 mg  25 mg Oral Q6H PRN Shuvon Rankin, NP   25 mg at 06/04/13 1635  . chlordiazePOXIDE (LIBRIUM) capsule 25 mg  25 mg Oral BH-qamhs Shuvon Rankin, NP       Followed by  . [START ON 06/06/2013] chlordiazePOXIDE (LIBRIUM) capsule 25 mg  25 mg Oral Daily Shuvon Rankin,  NP      . citalopram (CELEXA) tablet 10 mg  10 mg Oral Daily Rachael Fee, MD   10 mg at 06/04/13 0819  . cloNIDine (CATAPRES) tablet 0.1 mg  0.1 mg Oral BH-qamhs Shuvon Rankin, NP       Followed by  . [START ON 06/06/2013] cloNIDine (CATAPRES) tablet 0.1 mg  0.1 mg Oral QAC breakfast Shuvon Rankin, NP   0.1 mg at 06/04/13 0821  . dicyclomine (BENTYL) tablet 20 mg  20 mg Oral Q6H PRN Shuvon Rankin, NP      . docusate sodium (COLACE) capsule 100 mg  100 mg Oral BID Rachael Fee, MD   100 mg at 06/04/13 1634   . gabapentin (NEURONTIN) capsule 800 mg  800 mg Oral TID Rachael Fee, MD   800 mg at 06/04/13 1634  . hydrOXYzine (ATARAX/VISTARIL) tablet 25 mg  25 mg Oral Q6H PRN Rachael Fee, MD   25 mg at 06/04/13 1121  . ibuprofen (ADVIL,MOTRIN) tablet 800 mg  800 mg Oral Q6H PRN Sanjuana Kava, NP   800 mg at 06/04/13 1634  . Ipratropium-Albuterol (COMBIVENT) respimat 1 puff  1 puff Inhalation Q6H PRN Rachael Fee, MD      . lidocaine (LIDODERM) 5 % 1 patch  1 patch Transdermal Q breakfast Sanjuana Kava, NP   1 patch at 06/04/13 [phone]  . loperamide (IMODIUM) capsule 2-4 mg  2-4 mg Oral PRN Shuvon Rankin, NP      . magnesium hydroxide (MILK OF MAGNESIA) suspension 30 mL  30 mL Oral Daily PRN Shuvon Rankin, NP   30 mL at 06/02/13 2119  . methocarbamol (ROBAXIN) tablet 500 mg  500 mg Oral Q8H PRN Shuvon Rankin, NP   500 mg at 06/04/13 1634  . multivitamin with minerals tablet 1 tablet  1 tablet Oral Daily Shuvon Rankin, NP   1 tablet at 06/04/13 0824  . nicotine (NICODERM CQ - dosed in mg/24 hours) patch 21 mg  21 mg Transdermal Daily Rachael Fee, MD   21 mg at 06/04/13 0816  . ondansetron (ZOFRAN-ODT) disintegrating tablet 4 mg  4 mg Oral Q6H PRN Shuvon Rankin, NP      . pantoprazole (PROTONIX) EC tablet 40 mg  40 mg Oral Daily Shuvon Rankin, NP   40 mg at 06/04/13 0900  . QUEtiapine (SEROQUEL XR) 24 hr tablet 150 mg  150 mg Oral QHS Shuvon Rankin, NP   100 mg at 06/03/13 2203  . thiamine (VITAMIN B-1) tablet 100 mg  100 mg Oral Daily Shuvon Rankin, NP   100 mg at 06/04/13 9604   Or  . thiamine (B-1) injection 100 mg  100 mg Intravenous Daily Shuvon Rankin, NP      . tiotropium (SPIRIVA) inhalation capsule 18 mcg  18 mcg Inhalation Daily Rachael Fee, MD   18 mcg at 06/04/13 5409    Lab Results:  No results found for this or any previous visit (from the past 48 hour(s)).  Physical Findings: AIMS: Facial and Oral Movements Muscles of Facial Expression: None, normal Lips and Perioral Area: None,  normal Jaw: None, normal Tongue: None, normal,Extremity Movements Upper (arms, wrists, hands, fingers): None, normal Lower (legs, knees, ankles, toes): None, normal, Trunk Movements Neck, shoulders, hips: None, normal, Overall Severity Severity of abnormal movements (highest score from questions above): None, normal Incapacitation due to abnormal movements: None, normal Patient's awareness of abnormal movements (rate only patient's report): No Awareness, Dental Status Current  problems with teeth and/or dentures?: No Does patient usually wear dentures?: No  CIWA:  CIWA-Ar Total: 2 COWS:  COWS Total Score: 5  Treatment Plan Summary: Daily contact with patient to assess and evaluate symptoms and progress in treatment Medication management  Plan: Supportive approach/coping skills/relapse prevention           Continue detox protocols; Librium, Clonidine           Optimize treatment with psychotropics           CBT;mindfulness Continue current treatment plan  Medical Decision Making Problem Points:  Review of psycho-social stressors (1) Data Points:  Review of medication regiment & side effects (2) Review of new medications or change in dosage (2)  I certify that inpatient services furnished can reasonably be expected to improve the patient's condition.   Malachy Chamber S FNP-BC 06/04/2013, 4:38 PM

## 2013-06-04 NOTE — Progress Notes (Signed)
Patient ID: Charlotte Fisher, female   DOB: 01-26-1966, 47 y.o.   MRN: [id number] D)  Has been out and about on the hall this evening, attended group, talkative, voicing concern about not being able to have enough time to visit with her mother tomorrow as she depends on someone else to give her a ride here after church.  Voicing concern about not seeing her again until after she's out of rehab at [facility].  Refused to take full dose of 150 mg of seroquel, only would take 100 mg as she said it makes her too sedated in the am, also refused clonidine.  Has been tangential, intrusive, asking questions and answering them herself, speech is pressured.  Has chronic back pain, was medicated with ibuprofen and heat packs were given to her as she went to bed. A)  Will continue to monitor for safety, continue POC R)  Safety maintained.

## 2013-06-04 NOTE — Progress Notes (Signed)
Patient ID: Charlotte Fisher, female   DOB: 06-Nov-1965, 47 y.o.   MRN: [id number] D. Patient presents with anxious mood, affect congruent. She has been very intrusive, with multiple requests throughout day. A. Patient  Attended unit programming but left group early. medications given as ordered. Support and encouragement provided.  R.  Will continue to monitor q 15 minutes for safety.

## 2013-06-04 NOTE — Progress Notes (Signed)
[facility] Group Notes:  (Nursing/MHT/Case Management/Adjunct)  Date:  06/04/2013  Time:  6:47 PM  Type of Therapy:  Psychoeducational Skills  Participation Level:  Active  Participation Quality:  Appropriate  Affect:  Appropriate  Cognitive:  Appropriate  Insight:  Appropriate  Engagement in Group:  Engaged  Modes of Intervention:  Activity  Summary of Progress/Problems: Pts played a game of pictionary using coping skills. Pt stated one coping skill she has learned is to appreciate what you have while you've got it.  Caswell Corwin 06/04/2013, 6:47 PM

## 2013-06-05 NOTE — Progress Notes (Signed)
Patient did attend the evening speaker AA meeting.  

## 2013-06-05 NOTE — Progress Notes (Signed)
Patient ID: Charlotte Fisher, female   DOB: Oct 03, 1965, 47 y.o.   MRN: [id number] Patient ID: Charlotte Fisher, female   DOB: 1965/07/15, 47 y.o.   MRN: [id number] [facility] MD Progress Note  06/05/2013 2:54 PM Charlotte Fisher  MRN:  [id number] Subjective:  [name] states "Im not feeling good at all". States she was given 5 laxatives, and GI upset and complaining of pain all over." She states she hasn't used the bathroom in over 1 month, prior to yesterday. She is requesting increase in her Vistaril, resuming her Robaxin and Protonix. Besides her GI upset and pain she feels good, better than yesterday. She is attending all group sessions. She reports resting well last night. She also notes a decrease in her withdrawal symtpoms, detox treatment is going fairly well. Upon discharge she is hoping to get into [facility]. Charlotte Fisher has contacted [facility] today, and she was advised to call them back in the morning at 9,10, and 11 for possible bed placement. Rates her depression at 0, with intermittent periods of anxiety that are controlled with medications.  Denies any SI/HI/AVH.  Diagnosis:   DSM5: Schizophrenia Disorders:  none Obsessive-Compulsive Disorders:  none Trauma-Stressor Disorders:  none Substance/Addictive Disorders:  Polysubstance Dependence Depressive Disorders:  Major Depressive Disorder - Moderate (296.22)  Axis I: Anxiety Disorder NOS, Mood Disorder NOS and Substance Induced Mood Disorder  ADL's:  Intact  Sleep: Fair  Appetite:  Fair  Suicidal Ideation:  Plan:  denies Intent:  denies Means:  denies Homicidal Ideation:  Plan:  denies Intent:  denies Means:  denies AEB (as evidenced by):  Psychiatric Specialty Exam: Review of Systems  Constitutional: Positive for malaise/fatigue.  HENT: Negative.   Eyes: Negative.   Respiratory: Positive for cough and shortness of breath.   Cardiovascular: Negative.   Gastrointestinal: Positive for constipation.  Genitourinary: Negative.   Musculoskeletal: Positive  for joint pain.  Skin: Negative.   Neurological: Positive for weakness.  Endo/Heme/Allergies: Negative.   Psychiatric/Behavioral: Positive for depression and substance abuse. The patient is nervous/anxious.     Blood pressure 99/67, pulse 81, temperature 97.3 F (36.3 C), temperature source Oral, resp. rate 20, height 5\' 1"  (1.549 m), weight 79.833 kg (176 lb).Body mass index is 33.27 kg/(m^2).  General Appearance: Casual  Eye Contact::  Fair  Speech:  Clear and Coherent  Volume:  Normal  Mood:  Dysphoric  Affect:  Depressed  Thought Process:  Coherent and Goal Directed  Orientation:  Full (Time, Place, and Person)  Thought Content:  symptoms, worries, concerns  Suicidal Thoughts:  No  Homicidal Thoughts:  No  Memory:  Immediate;   Fair Recent;   Fair Remote;   Fair  Judgement:  Fair  Insight:  Present  Psychomotor Activity:  Restlessness  Concentration:  Fair  Recall:  Fair  Akathisia:  No  Handed:    AIMS (if indicated):     Assets:  Desire for Improvement  Sleep:  Number of Hours: 5.75   Current Medications: Current Facility-Administered Medications  Medication Dose Route Frequency Provider Last Rate Last Dose  . albuterol (PROVENTIL HFA;VENTOLIN HFA) 108 (90 BASE) MCG/ACT inhaler 2 puff  2 puff Inhalation Q6H PRN Charlotte Rankin, NP   2 puff at 06/04/13 1306  . alum & mag hydroxide-simeth (MAALOX/MYLANTA) 200-200-20 MG/5ML suspension 30 mL  30 mL Oral Q4H PRN Charlotte Rankin, NP   30 mL at 06/05/13 1406  . amitriptyline (ELAVIL) tablet 25 mg  25 mg Oral QHS Charlotte Kava, NP   25 mg at  06/04/13 2108  . amLODipine (NORVASC) tablet 5 mg  5 mg Oral Daily Charlotte Rankin, NP   5 mg at 06/04/13 0820  . bisacodyl (DULCOLAX) EC tablet 5 mg  5 mg Oral Daily PRN Charlotte Fee, MD   5 mg at 06/04/13 1120  . [START ON 06/06/2013] chlordiazePOXIDE (LIBRIUM) capsule 25 mg  25 mg Oral Daily Charlotte Rankin, NP      . citalopram (CELEXA) tablet 10 mg  10 mg Oral Daily Charlotte Fee, MD   10 mg  at 06/05/13 0837  . cloNIDine (CATAPRES) tablet 0.1 mg  0.1 mg Oral BH-qamhs Charlotte Rankin, NP   0.1 mg at 06/05/13 0748   Followed by  . [START ON 06/06/2013] cloNIDine (CATAPRES) tablet 0.1 mg  0.1 mg Oral QAC breakfast Charlotte Rankin, NP   0.1 mg at 06/04/13 0821  . dicyclomine (BENTYL) tablet 20 mg  20 mg Oral Q6H PRN Charlotte Rankin, NP      . docusate sodium (COLACE) capsule 100 mg  100 mg Oral BID Charlotte Fee, MD   100 mg at 06/05/13 0741  . gabapentin (NEURONTIN) capsule 800 mg  800 mg Oral TID Charlotte Fee, MD   800 mg at 06/05/13 1207  . hydrOXYzine (ATARAX/VISTARIL) tablet 25 mg  25 mg Oral Q6H PRN Charlotte Fee, MD   25 mg at 06/05/13 0750  . ibuprofen (ADVIL,MOTRIN) tablet 800 mg  800 mg Oral Q6H PRN Charlotte Kava, NP   800 mg at 06/04/13 2133  . Ipratropium-Albuterol (COMBIVENT) respimat 1 puff  1 puff Inhalation Q6H PRN Charlotte Fee, MD      . lidocaine (LIDODERM) 5 % 1 patch  1 patch Transdermal Q breakfast Charlotte Kava, NP   1 patch at 06/04/13 [phone]  . magnesium hydroxide (MILK OF MAGNESIA) suspension 30 mL  30 mL Oral Daily PRN Charlotte Rankin, NP   30 mL at 06/02/13 2119  . methocarbamol (ROBAXIN) tablet 500 mg  500 mg Oral Q8H PRN Charlotte Rankin, NP   500 mg at 06/05/13 0750  . multivitamin with minerals tablet 1 tablet  1 tablet Oral Daily Charlotte Rankin, NP   1 tablet at 06/05/13 0742  . nicotine (NICODERM CQ - dosed in mg/24 hours) patch 21 mg  21 mg Transdermal Daily Charlotte Fee, MD   21 mg at 06/05/13 [phone]  . pantoprazole (PROTONIX) EC tablet 40 mg  40 mg Oral Daily Charlotte Rankin, NP   40 mg at 06/05/13 0742  . QUEtiapine (SEROQUEL XR) 24 hr tablet 150 mg  150 mg Oral QHS Charlotte Rankin, NP   100 mg at 06/04/13 2109  . thiamine (VITAMIN B-1) tablet 100 mg  100 mg Oral Daily Charlotte Rankin, NP   100 mg at 06/05/13 0741   Or  . thiamine (B-1) injection 100 mg  100 mg Intravenous Daily Charlotte Rankin, NP      . tiotropium (SPIRIVA) inhalation capsule 18 mcg  18 mcg Inhalation  Daily Charlotte Fee, MD   18 mcg at 06/05/13 8469    Lab Results:  No results found for this or any previous visit (from the past 48 hour(s)).  Physical Findings: AIMS: Facial and Oral Movements Muscles of Facial Expression: None, normal Lips and Perioral Area: None, normal Jaw: None, normal Tongue: None, normal,Extremity Movements Upper (arms, wrists, hands, fingers): None, normal Lower (legs, knees, ankles, toes): None, normal, Trunk Movements Neck, shoulders, hips: None, normal, Overall Severity Severity of abnormal  movements (highest score from questions above): None, normal Incapacitation due to abnormal movements: None, normal Patient's awareness of abnormal movements (rate only patient's report): No Awareness, Dental Status Current problems with teeth and/or dentures?: No Does patient usually wear dentures?: No  CIWA:  CIWA-Ar Total: 5 COWS:  COWS Total Score: 3  Treatment Plan Summary: Daily contact with patient to assess and evaluate symptoms and progress in treatment Medication management  Plan: Supportive approach/coping skills/relapse prevention Received laxatives on 06/04/13 for constipation-effective.            Continue detox protocols; Librium, Clonidine           Optimize treatment with psychotropics           CBT;mindfulness Continue current treatment plan  Medical Decision Making Problem Points:  Review of psycho-social stressors (1) Data Points:  Review of medication regiment & side effects (2) Review of new medications or change in dosage (2)  I certify that inpatient services furnished can reasonably be expected to improve the patient's condition.   Malachy Chamber S FNP-BC 06/05/2013, 2:54 PM

## 2013-06-05 NOTE — Progress Notes (Signed)
[facility] Group Notes:  (Nursing/MT/Case Management/Adjunct)  Date:  06/04/2013 Time:  2100   Type of Therapy:  wrap up group  Participation Level:  Active  Participation Quality:  Appropriate, Attentive, Sharing and Supportive  Affect:  Appropriate and Excited  Cognitive:  Alert and Appropriate  Insight:  Appropriate  Engagement in Group:  Engaged  Modes of Intervention:  Clarification, Education and Support  Summary of Progress/Problems:Pt states " I have made my life pure hell and I made it that way. I can't blame anyone else." Pt requested the number to [facility] and [facility].  Pt reports being to old to continue the party and she isn't going to be living the way she has been living. Mother is her payee but would like to set up housing that is paid directly from her disability benefits.   Charlotte Fisher 06/05/2013, 3:23 AM

## 2013-06-05 NOTE — Progress Notes (Signed)
D - Pt alert and oriented, flat affect and drowsy this evening. Pt reports she is "sleepy." Pt attended group tonight listening attentively. Pt anxious with hypomanic mood. Pt remains intrusive and disruptive with peers in line at the medication window. Pt had to be directed away from the medication window while another pt was receiving medications. Explained to pt she needed to wait her turn at the medication window. Pt fixated on her medications, requesting several PRN medications wanting to take everything.  A - Offered encouragement and support through therapeutic conversation. Encouraged pt to speak with staff about any concerns or questions. Medications given as ordered.  R - Q 15 minute checks for safety. Pt remains safe on the unit.

## 2013-06-05 NOTE — Progress Notes (Signed)
Patient ID: Charlotte Fisher, female   DOB: [birth year], 47 y.o.   MRN: [id number] D. Patient continues to present with anxious, hypomanic mood, affect congruent. She remains with  Very Circumstantial speech, easily distractable. She has been very intrusive,and disruptive again today, arguing with peers while in line at the medication window this am stating '' Nobody is going to talk shit to me I'll fucking kick their ass, I'm going to do whatever I want to do to say what I need for my medications. I can fucking give a good fight too '' She continues to be very focused on prn medications, specifically Robaxin stating '' I'm going to need a prescription for that too when I leave, I have pain everywhere and I know you're saying that I have that for detox but I'm going to need that you need to put a note for the doctor to give it to me. ''A. Patient encouraged to attend unit programming, medications given as ordered. Support and encouragement provided.R. Patient able to be verbally redirected at this time. In no acute distress at this time. No further voiced concerns at this time. Will continue to monitor q 15 minutes for safety.

## 2013-06-05 NOTE — Progress Notes (Signed)
[facility] Group Notes:  (Nursing/MHT/Case Management/Adjunct)  Date:  06/05/2013  Time:  6:53 PM  Type of Therapy:  Psychoeducational Skills  Participation Level:  Did Not Attend  Summary of Progress/Problems: Pt stated she was going to get ready to see her mom instead of coming to group.  Caswell Corwin 06/05/2013, 6:53 PM

## 2013-06-05 NOTE — [facility] Group Notes (Signed)
[facility] Group Notes:  (Nursing/MHT/Case Management/Adjunct)  Date:  06/05/2013  Time:  10:41 AM  Date: 06/05/2013  Time: 10:38 AM  Type of Therapy: Psychoeducational Skills  Participation Level: Did Not Attend  Participation Quality: na  Affect: na  Cognitive: na  Insight: None  Engagement in Group: na  Modes of Intervention: na  Summary of Progress/Problems: making healthy choices  Charlotte Fisher 06/05/2013, 10:41 AM

## 2013-06-05 NOTE — [facility] Group Notes (Signed)
[facility] Group Notes:  (Nursing/MHT/Case Management/Adjunct)  Date:  06/05/2013  Time:  2:10 PM  Type of Therapy:  Psychoeducational Skills  Participation Level:  Did Not Attend  Participation Quality:  na  Affect:  na  Cognitive:  na  Insight:  None  Engagement in Group:  na  Modes of Intervention:  na  Summary of Progress/Problems:psychoeducational skills, focus on healthy living, healthy coping skills in relation to substance abuse lifestyle.   Malva Limes 06/05/2013, 2:10 PM

## 2013-06-05 NOTE — [facility] Group Notes (Signed)
[facility] Group Notes: (Clinical Social Work)   06/05/2013      Type of Therapy:  Group Therapy   Participation Level:  Did Not Attend - was present at the beginning of group, but had to use restroom and left.  She stated her supports are her mother and 4 adult children.   Ambrose Mantle, LCSW 06/05/2013, 1:11 PM

## 2013-06-06 MED ORDER — QUETIAPINE FUMARATE ER 50 MG PO TB24: 150.0000 mg | ORAL_TABLET | Freq: Every day | ORAL | Status: DC

## 2013-06-06 MED ORDER — PANTOPRAZOLE SODIUM 40 MG PO TBEC
80.0000 mg | DELAYED_RELEASE_TABLET | Freq: Every day | ORAL | Status: DC
  Filled 2013-06-06 (×2): qty 28

## 2013-06-06 MED ORDER — HYDROXYZINE HCL 25 MG PO TABS: 25.0000 mg | ORAL_TABLET | Freq: Four times a day (QID) | ORAL | Status: DC | PRN

## 2013-06-06 MED ORDER — LIDOCAINE 5 % EX PTCH: 1.0000 | MEDICATED_PATCH | Freq: Every day | CUTANEOUS | Status: DC

## 2013-06-06 MED ORDER — CITALOPRAM HYDROBROMIDE 10 MG PO TABS: 10.0000 mg | ORAL_TABLET | Freq: Every day | ORAL | Status: DC

## 2013-06-06 MED ORDER — IPRATROPIUM-ALBUTEROL 20-100 MCG/ACT IN AERS: 1.0000 | INHALATION_SPRAY | Freq: Four times a day (QID) | RESPIRATORY_TRACT | Status: DC | PRN

## 2013-06-06 MED ORDER — GABAPENTIN 800 MG PO TABS
800.0000 mg | ORAL_TABLET | Freq: Three times a day (TID) | ORAL | Status: DC
  Filled 2013-06-06: qty 42

## 2013-06-06 MED ORDER — AMLODIPINE BESYLATE 5 MG PO TABS: 5.0000 mg | ORAL_TABLET | Freq: Every day | ORAL | Status: DC

## 2013-06-06 MED ORDER — DSS 100 MG PO CAPS: 100.0000 mg | ORAL_CAPSULE | Freq: Two times a day (BID) | ORAL | Status: DC

## 2013-06-06 MED ORDER — GABAPENTIN 400 MG PO CAPS: 800.0000 mg | ORAL_CAPSULE | Freq: Three times a day (TID) | ORAL | Status: DC

## 2013-06-06 MED ORDER — AMITRIPTYLINE HCL 10 MG PO TABS: 10.0000 mg | ORAL_TABLET | Freq: Every day | ORAL | Status: DC

## 2013-06-06 MED ORDER — METHOCARBAMOL 500 MG PO TABS: 500.0000 mg | ORAL_TABLET | Freq: Three times a day (TID) | ORAL | Status: DC | PRN

## 2013-06-06 MED ORDER — LOPERAMIDE HCL 2 MG PO CAPS
2.0000 mg | ORAL_CAPSULE | ORAL | Status: DC | PRN
  Administered 2013-06-06: 4 mg via ORAL
  Filled 2013-06-06: qty 1

## 2013-06-06 MED ORDER — OMEPRAZOLE 40 MG PO CPDR: 40.0000 mg | DELAYED_RELEASE_CAPSULE | Freq: Every day | ORAL | Status: DC

## 2013-06-06 MED ORDER — AMITRIPTYLINE HCL 10 MG PO TABS
10.0000 mg | ORAL_TABLET | Freq: Every day | ORAL | Status: DC
  Filled 2013-06-06: qty 14

## 2013-06-06 MED ORDER — TIOTROPIUM BROMIDE MONOHYDRATE 18 MCG IN CAPS: 18.0000 ug | ORAL_CAPSULE | Freq: Every day | RESPIRATORY_TRACT | Status: DC

## 2013-06-06 NOTE — Progress Notes (Signed)
Patient ID: Charlotte Fisher, female   DOB: August 15, 1965, 48 y.o.   MRN: [id number]  D: Patient reports ready for discharge to long term facility. No SI or withdrawal symptoms at present. A: Obtained all belongings from room and locker. Also obtained medications and prescriptions.  R: A person from [facility] here to pick up to go straight to long term treatment. Follow up with [facility] after long term.

## 2013-06-06 NOTE — [facility] Suicide Risk Assessment (Signed)
Suicide Risk Assessment  Discharge Assessment     Demographic Factors:  Caucasian  Mental Status Per Nursing Assessment::   On Admission:  NA  Current Mental Status by Physician: In full contact with reality. There are no suicidal ideas, plans or intent. Her mood is euthymic, her affect is appropriate. There are no S/S of withdrawal. She is going to [facility] to pursue further rehab work. She states she is committed to long term abstinence.     Loss Factors: Decline in physical health and Financial problems/change in socioeconomic status  Historical Factors: NA  Risk Reduction Factors:   Sense of responsibility to family  Continued Clinical Symptoms:  Alcohol/Substance Abuse/Dependencies Chronic Pain Medical Diagnoses and Treatments/Surgeries  Cognitive Features That Contribute To Risk:  Closed-mindedness Polarized thinking Thought constriction (tunnel vision)    Suicide Risk:  Minimal: No identifiable suicidal ideation.  Patients presenting with no risk factors but with morbid ruminations; may be classified as minimal risk based on the severity of the depressive symptoms  Discharge Diagnoses:   AXIS I:  Opioid, Alcohol, Benzodiazepine Abuse, Depressive Disorder NOS AXIS II:  Deferred AXIS III:   Past Medical History  Diagnosis Date  . Asthma   . COPD (chronic obstructive pulmonary disease)   . Depression   . Anxiety    AXIS IV:  other psychosocial or environmental problems AXIS V:  51-60 moderate symptoms  Plan Of Care/Follow-up recommendations:  Activity:  as tolerated Diet:  regular Follow up [facility]  Is patient on multiple antipsychotic therapies at discharge:  No   Has Patient had three or more failed trials of antipsychotic monotherapy by history:  No  Recommended Plan for Multiple Antipsychotic Therapies: NA  Canio Winokur A 06/06/2013, 12:25 PM

## 2013-06-06 NOTE — ED Provider Notes (Signed)
Medical screening examination/treatment/procedure(s) were performed by non-physician practitioner and as supervising physician I was immediately available for consultation/collaboration.  EKG Interpretation    Date/Time:  Wednesday June 01 2013 16:35:30 EST Ventricular Rate:  89 PR Interval:  149 QRS Duration: 109 QT Interval:  395 QTC Calculation: 481 R Axis:   -52 Text Interpretation:  Sinus rhythm Abnormal R-wave progression, early transition No significant change since last tracing Confirmed by Juleen China  MD, Haylee Mcanany ([id number]) on 06/01/2013 4:48:43 PM Also confirmed by Juleen China  MD, Camy Leder ([id number]), editor [place]  CCT, BEVERLY ([id number])  on 06/02/2013 9:48:44 AM             Raeford Razor, MD 06/06/13 [phone]

## 2013-06-06 NOTE — Discharge Summary (Signed)
Physician Discharge Summary Note  Patient:  Charlotte Fisher is an 47 y.o., female MRN:  [id number] DOB:  02-Dec-1965 Patient phone:  [phone] (home)  Patient address:   [address] [place] [address],   Date of Admission:  06/01/2013 Date of Discharge: 06/06/2013  Reason for Admission:  Alcohol and cocaine detox/dependency  Discharge Diagnoses: Active Problems:   Polysubstance abuse  Review of Systems  Constitutional: Negative.   HENT: Negative.   Eyes: Negative.   Respiratory: Negative.   Cardiovascular: Negative.   Gastrointestinal: Negative.   Genitourinary: Negative.   Musculoskeletal: Negative.   Skin: Negative.   Neurological: Negative.   Endo/Heme/Allergies: Negative.   Psychiatric/Behavioral: Positive for substance abuse. The patient is nervous/anxious.     DSM5:  Substance/Addictive Disorders:  Alcohol Related Disorder - Severe (303.90), Alcohol Intoxication with Use Disorder - Severe (F10.229) and Alcohol Withdrawal (291.81) Depressive Disorders:  Major Depressive Disorder - Severe (296.23)  Axis Diagnosis:   AXIS I:  Alcohol Abuse, Anxiety Disorder NOS, Major Depression, Recurrent severe, Substance Abuse and Substance Induced Mood Disorder AXIS II:  Deferred AXIS III:   Past Medical History  Diagnosis Date  . Asthma   . COPD (chronic obstructive pulmonary disease)   . Depression   . Anxiety    AXIS IV:  economic problems, other psychosocial or environmental problems, problems related to social environment and problems with primary support group AXIS V:  61-70 mild symptoms  Level of Care:  IOP  Hospital Course:  On admission: 47 year old Caucasian female. Admitted to [facility] from the [facility] ED with complaints of severe abdominal pains, increased alcohol consumption, cocaine abuse and a request for detoxification treatments. Her UDS/toxicology reports include; (+) benzodiazepine, opiates and cocaine. Patient reports, "I went to the  [facility] ED yesterday because I was in so much pain. My stomach was hurting pretty bad. I have been drinking a lot of alcohol. I have been drinking everyday for about 20 years. In the past, I will drink a lot of alcohol, my stomach will start hurting, then I will stop drinking and the pain will subside. But these days, I have bad stomach pain even when I'm not drinking. I drink a pint of liquor, wine and other alcoholic beverages daily. I was sober for 19 months at a time, then relapsed. The reason for my relapse is beyond me. I drink alcohol to get drunk so that I will forget and not deal with my pain. I have both physical and emotional pains. I drink so that I will feel nothing. I was at [facility] treatment center about 2 years ago. I was also diagnosed with bipolar disorder about 2 years ago. I take Seroquel, Amitriptyline, Hydroxyzine and some pain patch. I go to the [facility] in [place]  for treatment. I have had blackouts in the past".   During hospitalization:  Librium and clonidine protocol used successfully for alcohol and opiate detox.  Her Albuterol inhaler PRN for COPD was continued and Combivent every 6 hours PRN COPD and Spiriva daily for COPD were started.  Her Seroquel for mood stability 150 mg at bedtime, Norvasc 5 mg for HTN were continued from her home medications.  Her Valium for anxiety was not continued.  Her Vistaril 50 mg TID from her home medications was changed to 25 mg every 6 hours PRN anxiety.  Protonix 40 mg was substituted for her Prilosec for GERD.  Her Elavil 10 mg for sleep from her  home medications was increased to 25 mg.  Celexa 10 mg for depression, Gabapentin 800 mg TID for anxiety and neuropathic pain, Colace 100 mg BID for constipation, Lidoderm patch for pain daily, and Robaxin 500 mg for muscle spasms PRN started.  Charlotte Fisher attended and participated in therapy.  He denied suicidal/homicidal ideations and auditory/visual hallucinations, follow-up appointments  encouraged to attend, outside support groups encouraged and information given, Rx and 14 day supply of medications given.  Charlotte Fisher is mentally and physically stable for discharge.  Consults:  None  Significant Diagnostic Studies:  Labs--completed, reviewed, stable  Discharge Vitals:   Blood pressure 94/65, pulse 97, temperature 97.3 F (36.3 C), temperature source Oral, resp. rate 18, height 5\' 1"  (1.549 m), weight 79.833 kg (176 lb). Body mass index is 33.27 kg/(m^2). Lab Results:   No results found for this or any previous visit (from the past 72 hour(s)).  Physical Findings: AIMS: Facial and Oral Movements Muscles of Facial Expression: None, normal Lips and Perioral Area: None, normal Jaw: None, normal Tongue: None, normal,Extremity Movements Upper (arms, wrists, hands, fingers): None, normal Lower (legs, knees, ankles, toes): None, normal, Trunk Movements Neck, shoulders, hips: None, normal, Overall Severity Severity of abnormal movements (highest score from questions above): None, normal Incapacitation due to abnormal movements: None, normal Patient's awareness of abnormal movements (rate only patient's report): No Awareness, Dental Status Current problems with teeth and/or dentures?: No Does patient usually wear dentures?: No  CIWA:  CIWA-Ar Total: 1 COWS:  COWS Total Score: 2  Psychiatric Specialty Exam: See Psychiatric Specialty Exam and Suicide Risk Assessment completed by Attending Physician prior to discharge.  Discharge destination:  [facility]  Is patient on multiple antipsychotic therapies at discharge:  No   Has Patient had three or more failed trials of antipsychotic monotherapy by history:  No  Recommended Plan for Multiple Antipsychotic Therapies: NA  Discharge Orders   Future Orders Complete By Expires   Activity as tolerated - No restrictions  As directed    Diet - low sodium heart healthy  As directed        Medication List    STOP taking these  medications       diazepam 5 MG tablet  Commonly known as:  VALIUM      TAKE these medications     Indication   albuterol 108 (90 BASE) MCG/ACT inhaler  Commonly known as:  PROVENTIL HFA;VENTOLIN HFA  Inhale 2 puffs into the lungs every 6 (six) hours as needed for wheezing.      amitriptyline 10 MG tablet  Commonly known as:  ELAVIL  Take 1 tablet (10 mg total) by mouth at bedtime.   Indication:  Trouble Sleeping     amLODipine 5 MG tablet  Commonly known as:  NORVASC  Take 1 tablet (5 mg total) by mouth daily.   Indication:  High Blood Pressure     citalopram 10 MG tablet  Commonly known as:  CELEXA  Take 1 tablet (10 mg total) by mouth daily.   Indication:  Depression     DSS 100 MG Caps  Take 100 mg by mouth 2 (two) times daily.   Indication:  Constipation     gabapentin 400 MG capsule  Commonly known as:  NEURONTIN  Take 2 capsules (800 mg total) by mouth 3 (three) times daily.   Indication:  Neuropathic Pain     hydrOXYzine 25 MG tablet  Commonly known as:  ATARAX/VISTARIL  Take 1 tablet (25 mg total) by  mouth every 6 (six) hours as needed for anxiety.   Indication:  Anxiety Neurosis     ibuprofen 200 MG tablet  Commonly known as:  ADVIL,MOTRIN  Take 400 mg by mouth every 6 (six) hours as needed for pain.      Ipratropium-Albuterol 20-100 MCG/ACT Aers respimat  Commonly known as:  COMBIVENT  Inhale 1 puff into the lungs every 6 (six) hours as needed for wheezing.   Indication:  Disease involving Spasms of the Lungs     lidocaine 5 %  Commonly known as:  LIDODERM  Place 1 patch onto the skin daily with breakfast. Remove & Discard patch within 12 hours or as directed by MD   Indication:  pain     methocarbamol 500 MG tablet  Commonly known as:  ROBAXIN  Take 1 tablet (500 mg total) by mouth every 8 (eight) hours as needed for muscle spasms.   Indication:  Musculoskeletal Pain     omeprazole 40 MG capsule  Commonly known as:  PRILOSEC  Take 1 capsule  (40 mg total) by mouth daily.   Indication:  Gastroesophageal Reflux Disease with Current Symptoms     QUEtiapine 50 MG Tb24 24 hr tablet  Commonly known as:  SEROQUEL XR  Take 3 tablets (150 mg total) by mouth at bedtime.   Indication:  Major Depressive Disorder     tiotropium 18 MCG inhalation capsule  Commonly known as:  SPIRIVA  Place 1 capsule (18 mcg total) into inhaler and inhale daily.   Indication:  Spasm of Lung Air Passages           Follow-up Information   Follow up with [facility].   Contact information:   [address]  [place], [place] [address] Phone: [phone] Fax: [phone]      Follow up with [facility]. (Walk in clinic is Monday - Friday 8 am - 3 pm, for medication management and therapy)    Contact information:   [address], [place] [address] Phone: [phone] Fax: [phone]       Follow-up recommendations:  Activity:  as tolerated Diet:  low-sodium heart healthy diet Continue to work your relapse prevention plan and the life style changes that could help you better manage your anxiety and mood disorders Comments:  Patient will continue her rehab at [facility].  Total Discharge Time:  Greater than 30 minutes.  SignedNanine Means, PMH-NP 06/06/2013, 9:59 AM Agree with assessment and plan Reymundo Poll. Dub Mikes, M.D.

## 2013-06-06 NOTE — [facility] Group Notes (Signed)
[facility] LCSW Aftercare Discharge Planning Group Note   06/06/2013 9:53 AM  Participation Quality:  Intrusive   Mood/Affect:  Excited  Depression Rating:  0  Anxiety Rating:  0  Thoughts of Suicide:  No Will you contract for safety?   NA  Current AVH:  No  Plan for Discharge/Comments:  Pt just completed phone assessment with [facility]. She is scheduled for d/c today. [facility] may have bed and stated that they will contact CSW to discuss. PT also to follow up at [facility] for med management.   Transportation Means: [facility]?  Supports: none identified by pt.   Smart, HeatherLCSWA

## 2013-06-06 NOTE — Progress Notes (Signed)
[facility] Adult Case Management Discharge Plan :  Will you be returning to the same living situation after discharge: No.Admission into [facility] at d/c. At discharge, do you have transportation home?:Yes,  [facility] providing transport Do you have the ability to pay for your medications:Yes,  [facility]  Release of information consent forms completed and in the chart;  Patient's signature needed at discharge.  Patient to Follow up at: Follow-up Information   Follow up with [facility] On 06/06/2013. ([facility] will pick you up for transport at 1:00PM )    Contact information:   [address]  [place], [place] [address] Phone: [phone] Fax: [phone]      Follow up with [facility]. (Walk in clinic is Monday - Friday 8 am - 3 pm, for medication management and therapy)    Contact information:   [address], [place] [address] Phone: [phone] Fax: [phone]       Patient denies SI/HI:   Yes,  during admission/self report/Group    Safety Planning and Suicide Prevention discussed:  Yes,  SPE not required for this pt. SPI pamphlet provided to pt and she was encourated to share this information with her support network.   Smart, Ivori Storr LCSWA 06/06/2013, 11:41 AM

## 2013-06-09 NOTE — Progress Notes (Signed)
Patient Discharge Instructions:  After Visit Summary (AVS):   Faxed to:  06/09/13 Discharge Summary Note:   Faxed to:  06/09/13 Psychiatric Admission Assessment Note:   Faxed to:  06/09/13 Suicide Risk Assessment - Discharge Assessment:   Faxed to:  06/09/13 Faxed/Sent to the Next Level Care provider:  06/09/13 Faxed to [facility] @ [phone] Faxed to [facility] @ [phone]  [name], 06/09/2013, 3:15 PM

## 2013-08-22 ENCOUNTER — Emergency Department (HOSPITAL_COMMUNITY): Payer: Medicaid Other

## 2013-08-22 ENCOUNTER — Emergency Department (HOSPITAL_COMMUNITY)
Admission: EM | Admit: 2013-08-22 | Discharge: 2013-08-23 | Disposition: A | Discharge status: Home / Self Care | Payer: Medicaid Other | Attending: Emergency Medicine | Admitting: Emergency Medicine

## 2013-08-22 ENCOUNTER — Encounter (HOSPITAL_COMMUNITY): Payer: Self-pay | Admitting: Emergency Medicine

## 2013-08-22 DIAGNOSIS — F17200 Nicotine dependence, unspecified, uncomplicated: Secondary | ICD-10-CM | POA: Insufficient documentation

## 2013-08-22 DIAGNOSIS — F319 Bipolar disorder, unspecified: Secondary | ICD-10-CM | POA: Insufficient documentation

## 2013-08-22 DIAGNOSIS — F1010 Alcohol abuse, uncomplicated: Secondary | ICD-10-CM

## 2013-08-22 DIAGNOSIS — F1020 Alcohol dependence, uncomplicated: Secondary | ICD-10-CM

## 2013-08-22 DIAGNOSIS — F1410 Cocaine abuse, uncomplicated: Secondary | ICD-10-CM

## 2013-08-22 DIAGNOSIS — M79602 Pain in left arm: Secondary | ICD-10-CM

## 2013-08-22 DIAGNOSIS — F1910 Other psychoactive substance abuse, uncomplicated: Secondary | ICD-10-CM | POA: Diagnosis present

## 2013-08-22 DIAGNOSIS — Z79899 Other long term (current) drug therapy: Secondary | ICD-10-CM | POA: Insufficient documentation

## 2013-08-22 DIAGNOSIS — B349 Viral infection, unspecified: Secondary | ICD-10-CM

## 2013-08-22 DIAGNOSIS — F10229 Alcohol dependence with intoxication, unspecified: Secondary | ICD-10-CM | POA: Insufficient documentation

## 2013-08-22 DIAGNOSIS — J4489 Other specified chronic obstructive pulmonary disease: Secondary | ICD-10-CM | POA: Insufficient documentation

## 2013-08-22 DIAGNOSIS — F329 Major depressive disorder, single episode, unspecified: Secondary | ICD-10-CM

## 2013-08-22 DIAGNOSIS — J449 Chronic obstructive pulmonary disease, unspecified: Secondary | ICD-10-CM | POA: Insufficient documentation

## 2013-08-22 DIAGNOSIS — F411 Generalized anxiety disorder: Secondary | ICD-10-CM | POA: Insufficient documentation

## 2013-08-22 DIAGNOSIS — F32A Depression, unspecified: Secondary | ICD-10-CM

## 2013-08-22 DIAGNOSIS — B9789 Other viral agents as the cause of diseases classified elsewhere: Secondary | ICD-10-CM | POA: Insufficient documentation

## 2013-08-22 HISTORY — DX: Pain in left arm: M79.602

## 2013-08-22 HISTORY — DX: Other problems related to lifestyle: Z72.89

## 2013-08-22 HISTORY — DX: Alcohol use, unspecified, uncomplicated: F10.90

## 2013-08-22 LAB — COMPREHENSIVE METABOLIC PANEL
ALT: 39 U/L — ABNORMAL HIGH (ref 0–35)
Albumin: 3.6 g/dL (ref 3.5–5.2)
Alkaline Phosphatase: 80 U/L (ref 39–117)
BUN: 8 mg/dL (ref 6–23)
Chloride: 102 mEq/L (ref 96–112)
Creatinine, Ser: 0.9 mg/dL (ref 0.50–1.10)
Glucose, Bld: 113 mg/dL — ABNORMAL HIGH (ref 70–99)
Potassium: 3.3 mEq/L — ABNORMAL LOW (ref 3.7–5.3)
Sodium: 137 mEq/L (ref 137–147)
Total Bilirubin: 0.3 mg/dL (ref 0.3–1.2)
Total Protein: 7.5 g/dL (ref 6.0–8.3)

## 2013-08-22 LAB — COMPREHENSIVE METABOLIC PANEL WITH GFR
AST: 39 U/L — ABNORMAL HIGH (ref 0–37)
CO2: 22 meq/L (ref 19–32)
Calcium: 8.7 mg/dL (ref 8.4–10.5)
GFR calc Af Amer: 87 mL/min — ABNORMAL LOW (ref >90)
GFR calc non Af Amer: 75 mL/min — ABNORMAL LOW (ref >90)

## 2013-08-22 LAB — CBC
HCT: 41.9 % (ref 36.0–46.0)
Hemoglobin: 13.9 g/dL (ref 12.0–15.0)
MCH: 31.9 pg (ref 26.0–34.0)
MCHC: 33.2 g/dL (ref 30.0–36.0)
MCV: 96.1 fL (ref 78.0–100.0)
Platelets: 220 K/uL (ref 150–400)
RBC: 4.36 MIL/uL (ref 3.87–5.11)
RDW: 14.3 % (ref 11.5–15.5)
WBC: 6.7 10*3/uL (ref 4.0–10.5)

## 2013-08-22 LAB — RAPID URINE DRUG SCREEN, HOSP PERFORMED
Amphetamines: NOT DETECTED
Barbiturates: NOT DETECTED
Benzodiazepines: POSITIVE — AB
Cocaine: POSITIVE — AB
Opiates: NOT DETECTED
Tetrahydrocannabinol: NOT DETECTED

## 2013-08-22 LAB — SALICYLATE LEVEL: Salicylate Lvl: <2 mg/dL — ABNORMAL LOW (ref 2.8–20.0)

## 2013-08-22 LAB — ACETAMINOPHEN LEVEL: Acetaminophen (Tylenol), Serum: <15 ug/mL (ref 10–30)

## 2013-08-22 LAB — ETHANOL: Alcohol, Ethyl (B): <11 mg/dL (ref 0–11)

## 2013-08-22 MED ORDER — ONDANSETRON HCL 4 MG PO TABS
4.0000 mg | ORAL_TABLET | Freq: Three times a day (TID) | ORAL | Status: DC | PRN
Start: 2013-08-22 — End: 2013-08-23
  Administered 2013-08-23: 4 mg via ORAL
  Filled 2013-08-22: qty 1

## 2013-08-22 MED ORDER — ALBUTEROL SULFATE HFA 108 (90 BASE) MCG/ACT IN AERS
2.0000 | INHALATION_SPRAY | Freq: Four times a day (QID) | RESPIRATORY_TRACT | Status: DC | PRN
  Administered 2013-08-23: 2 via RESPIRATORY_TRACT
  Filled 2013-08-22: qty 6.7

## 2013-08-22 MED ORDER — TIOTROPIUM BROMIDE MONOHYDRATE 18 MCG IN CAPS
18.0000 ug | ORAL_CAPSULE | Freq: Every day | RESPIRATORY_TRACT | Status: DC
  Administered 2013-08-23: 18 ug via RESPIRATORY_TRACT
  Filled 2013-08-22: qty 5

## 2013-08-22 MED ORDER — LORAZEPAM 1 MG PO TABS: 1.0000 mg | ORAL_TABLET | Freq: Three times a day (TID) | ORAL | Status: DC | PRN

## 2013-08-22 MED ORDER — QUETIAPINE FUMARATE ER 50 MG PO TB24
150.0000 mg | ORAL_TABLET | Freq: Every day | ORAL | Status: DC
  Administered 2013-08-23: 150 mg via ORAL
  Filled 2013-08-22 (×2): qty 3

## 2013-08-22 MED ORDER — CITALOPRAM HYDROBROMIDE 10 MG PO TABS
10.0000 mg | ORAL_TABLET | Freq: Every day | ORAL | Status: DC
  Administered 2013-08-23: 10 mg via ORAL
  Filled 2013-08-22: qty 1

## 2013-08-22 MED ORDER — ACETAMINOPHEN 325 MG PO TABS
650.0000 mg | ORAL_TABLET | ORAL | Status: DC | PRN
  Administered 2013-08-23: 650 mg via ORAL
  Filled 2013-08-22: qty 2

## 2013-08-22 MED ORDER — LORAZEPAM 1 MG PO TABS
0.0000 mg | ORAL_TABLET | Freq: Four times a day (QID) | ORAL | Status: DC
  Administered 2013-08-22: 2 mg via ORAL
  Administered 2013-08-22: 1 mg via ORAL
  Filled 2013-08-22: qty 1
  Filled 2013-08-22: qty 2

## 2013-08-22 MED ORDER — METHOCARBAMOL 500 MG PO TABS
500.0000 mg | ORAL_TABLET | Freq: Three times a day (TID) | ORAL | Status: DC | PRN
  Administered 2013-08-22: 500 mg via ORAL
  Filled 2013-08-22: qty 1

## 2013-08-22 MED ORDER — PANTOPRAZOLE SODIUM 40 MG PO TBEC
40.0000 mg | DELAYED_RELEASE_TABLET | Freq: Every day | ORAL | Status: DC
  Administered 2013-08-23: 40 mg via ORAL
  Filled 2013-08-22: qty 1

## 2013-08-22 MED ORDER — GABAPENTIN 400 MG PO CAPS
800.0000 mg | ORAL_CAPSULE | Freq: Three times a day (TID) | ORAL | Status: DC
  Administered 2013-08-23 (×2): 800 mg via ORAL
  Filled 2013-08-22 (×4): qty 2

## 2013-08-22 MED ORDER — LORAZEPAM 1 MG PO TABS: 0.0000 mg | ORAL_TABLET | Freq: Two times a day (BID) | ORAL | Status: DC

## 2013-08-22 MED ORDER — DOCUSATE SODIUM 100 MG PO CAPS
100.0000 mg | ORAL_CAPSULE | Freq: Two times a day (BID) | ORAL | Status: DC
  Administered 2013-08-23: 100 mg via ORAL
  Filled 2013-08-22 (×4): qty 1

## 2013-08-22 MED ORDER — AMITRIPTYLINE HCL 10 MG PO TABS
10.0000 mg | ORAL_TABLET | Freq: Every day | ORAL | Status: DC
  Administered 2013-08-23: 10 mg via ORAL
  Filled 2013-08-22 (×2): qty 1

## 2013-08-22 MED ORDER — HYDROXYZINE HCL 25 MG PO TABS
25.0000 mg | ORAL_TABLET | Freq: Four times a day (QID) | ORAL | Status: DC | PRN
  Administered 2013-08-22: 25 mg via ORAL
  Filled 2013-08-22: qty 1

## 2013-08-22 NOTE — ED Notes (Signed)
Pt is A&O x3.  Pt has mild trembling in her hands, reports that flu like sx did decrease after having ETOH.  Pt has b/l expiratory wheezing, breathing is NOT labored.
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
## 2013-08-22 NOTE — ED Notes (Signed)
Pt states that she has had a sore throat, runny nose, cough, emesis, and diarrhea x 3 days. States she also wants alcohol detox/treatment. Last drink last night. States she drinks 6 bottles of Maddog 20/20 (wine) and a fifth of liquor a day. Denies SI/HI.

## 2013-08-22 NOTE — BH Assessment (Signed)
[facility] Assessment Progress Note  At 22:11 I spoke to EDP Dr Oletta LamasGhim in anticipation of TTS assessment scheduled for 22:20.  Doylene Canninghomas Benen Weida, MA Triage Specialist 08/22/2013 @ 22:13
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
## 2013-08-23 ENCOUNTER — Encounter (HOSPITAL_COMMUNITY): Payer: Self-pay | Admitting: Registered Nurse

## 2013-08-23 DIAGNOSIS — F1010 Alcohol abuse, uncomplicated: Secondary | ICD-10-CM

## 2013-08-23 DIAGNOSIS — F1910 Other psychoactive substance abuse, uncomplicated: Secondary | ICD-10-CM

## 2013-08-23 DIAGNOSIS — F1020 Alcohol dependence, uncomplicated: Secondary | ICD-10-CM

## 2013-08-23 MED ORDER — AMITRIPTYLINE HCL 10 MG PO TABS: 10.0000 mg | ORAL_TABLET | Freq: Every day | ORAL | Status: DC

## 2013-08-23 MED ORDER — LORAZEPAM 2 MG PO TABS: 0.0000 mg | ORAL_TABLET | Freq: Four times a day (QID) | ORAL | Status: DC

## 2013-08-23 MED ORDER — ONDANSETRON HCL 4 MG PO TABS: 4.0000 mg | ORAL_TABLET | Freq: Three times a day (TID) | ORAL | Status: DC | PRN

## 2013-08-23 NOTE — ED Notes (Signed)
Pain score was from 1300.
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
## 2013-08-23 NOTE — Progress Notes (Addendum)
Received phone call from [place] at [facility], pt has been accepted to their facility and can come after 7am but they would like more information on why pt is on a lidocaine patch and if pt would be willing to go without for 3-5 days while in detox tx center.  Also stated that it would have to be discussed with his supervisor whether or not pt would get it while there.  Counsel[facility] was informed of situation.  Will call [facility] back in the am with pts decision.  [phone] Received update from counselor [facility].  Pt has not received a lidocaine patch since being in the ED, even though in pt's MAR, and does not have one on presently so should not be an issue while at detox facility.  Will call [facility] to give information.    Tomi BambergerMariya Asim Gersten Disposition M[facility]

## 2013-08-23 NOTE — Consult Note (Signed)
Face to face evaluation and I agree with this note 

## 2013-08-23 NOTE — Discharge Instructions (Signed)
Finding Treatment for Alcohol and Drug Addiction It can be hard to find the right place to get professional treatment. Here are some important things to consider:  There are different types of treatment to choose from.  Some programs are live-in (residential) while others are not (outpatient). Sometimes a combination is offered.  No single type of program is right for everyone.  Most treatment programs involve a combination of education, counseling, and a 12-step, spiritually-based approach.  There are non-spiritually based programs (not 12-step).  Some treatment programs are government sponsored. They are geared for patients without private insurance.  Treatment programs can vary in many respects such as:  Cost and types of insurance accepted.  Types of on-site medical services offered.  Length of stay, setting, and size.  Overall philosophy of treatment. A person may need specialized treatment or have needs not addressed by all programs. For example, adolescents need treatment appropriate for their age. Other people have secondary disorders that must be managed as well. Secondary conditions can include mental illness, such as depression or diabetes. Often, a period of detoxification from alcohol or drugs is needed. This requires medical supervision and not all programs offer this. THINGS TO CONSIDER WHEN SELECTING A TREATMENT PROGRAM   Is the program certified by the appropriate government agency? Even private programs must be certified and employ certified professionals.  Does the program accept your insurance? If not, can a payment plan be set up?  Is the facility clean, organized, and well run? Do they allow you to speak with graduates who can share their treatment experience with you? Can you tour the facility? Can you meet with staff?  Does the program meet the full range of individual needs?  Does the treatment program address sexual orientation and physical disabilities?  Do they provide age, gender, and culturally appropriate treatment services?  Is treatment available in languages other than English?  Is long-term aftercare support or guidance encouraged and provided?  Is assessment of an individual's treatment plan ongoing to ensure it meets changing needs?  Does the program use strategies to encourage reluctant patients to remain in treatment long enough to increase the likelihood of success?  Does the program offer counseling (individual or group) and other behavioral therapies?  Does the program offer medicine as part of the treatment regimen, if needed?  Is there ongoing monitoring of possible relapse? Is there a defined relapse prevention program? Are services or referrals offered to family members to ensure they understand addiction and the recovery process? This would help them support the recovering individual.  Are 12-step meetings held at the center or is transport available for patients to attend outside meetings? In countries outside of the [place] and [place], [employer] for contact information for services in your area. Document Released: 05/15/2005 Document Revised: 09/08/2011 Document Reviewed: 11/25/2007 [facility] Patient Information 2014 [place].  Alcohol Problems Most adults who drink alcohol drink in moderation (not a lot) are at low risk for developing problems related to their drinking. However, all drinkers, including low-risk drinkers, should know about the health risks connected with drinking alcohol. RECOMMENDATIONS FOR LOW-RISK DRINKING  Drink in moderation. Moderate drinking is defined as follows:   Men - no more than 2 drinks per day.  Nonpregnant women - no more than 1 drink per day.  Over age 24 - no more than 1 drink per day. A standard drink is 12 grams of pure alcohol, which is equal to a 12 ounce bottle of beer or  wine cooler, a 5 ounce glass of wine, or 1.5 ounces of distilled spirits (such as  whiskey, brandy, vodka, or rum).  ABSTAIN FROM (DO NOT DRINK) ALCOHOL:  When pregnant or considering pregnancy.  When taking a medication that interacts with alcohol.  If you are alcohol dependent.  A medical condition that prohibits drinking alcohol (such as ulcer, liver disease, or heart disease). DISCUSS WITH YOUR CAREGIVER:  If you are at risk for coronary heart disease, discuss the potential benefits and risks of alcohol use: Light to moderate drinking is associated with lower rates of coronary heart disease in certain populations (for example, men over age 59 and postmenopausal women). Infrequent or nondrinkers are advised not to begin light to moderate drinking to reduce the risk of coronary heart disease so as to avoid creating an alcohol-related problem. Similar protective effects can likely be gained through proper diet and exercise.  Women and the elderly have smaller amounts of body water than men. As a result women and the elderly achieve a higher blood alcohol concentration after drinking the same amount of alcohol.  Exposing a fetus to alcohol can cause a broad range of birth defects referred to as Fetal Alcohol Syndrome (FAS) or Alcohol-Related Birth Defects (ARBD). Although FAS/ARBD is connected with excessive alcohol consumption during pregnancy, studies also have reported neurobehavioral problems in infants born to mothers reporting drinking an average of 1 drink per day during pregnancy.  Heavier drinking (the consumption of more than 4 drinks per occasion by men and more than 3 drinks per occasion by women) impairs learning (cognitive) and psychomotor functions and increases the risk of alcohol-related problems, including accidents and injuries. CAGE QUESTIONS:   Have you ever felt that you should Cut down on your drinking?  Have people Annoyed you by criticizing your drinking?  Have you ever felt bad or Guilty about your drinking?  Have you ever had a drink first  thing in the morning to steady your nerves or get rid of a hangover (Eye opener)? If you answered positively to any of these questions: You may be at risk for alcohol-related problems if alcohol consumption is:   Men: Greater than 14 drinks per week or more than 4 drinks per occasion.  Women: Greater than 7 drinks per week or more than 3 drinks per occasion. Do you or your family have a medical history of alcohol-related problems, such as:  Blackouts.  Sexual dysfunction.  Depression.  Trauma.  Liver dysfunction.  Sleep disorders.  Hypertension.  Chronic abdominal pain.  Has your drinking ever caused you problems, such as problems with your family, problems with your work (or school) performance, or accidents/injuries?  Do you have a compulsion to drink or a preoccupation with drinking?  Do you have poor control or are you unable to stop drinking once you have started?  Do you have to drink to avoid withdrawal symptoms?  Do you have problems with withdrawal such as tremors, nausea, sweats, or mood disturbances?  Does it take more alcohol than in the past to get you high?  Do you feel a strong urge to drink?  Do you change your plans so that you can have a drink?  Do you ever drink in the morning to relieve the shakes or a hangover? If you have answered a number of the previous questions positively, it may be time for you to talk to your caregivers, family, and friends and see if they think you have a problem. Alcoholism is a Banker  dependency that keeps getting worse and will eventually destroy your health and relationships. Many alcoholics end up dead, impoverished, or in prison. This is often the end result of all chemical dependency.  Do not be discouraged if you are not ready to take action immediately.  Decisions to change behavior often involve up and down desires to change and feeling like you cannot decide.  Try to think more seriously about your drinking  behavior.  Think of the reasons to quit. WHERE TO GO FOR ADDITIONAL INFORMATION   The National Institute on Alcohol Abuse and Alcoholism (NIAAA) BasicStudents.dk  [employer] on Alcoholism and Drug Dependence (NCADD) www.ncadd.org  American Society of Addiction Medicine (ASAM) RoyalDiary.gl  Document Released: 06/16/2005 Document Revised: 09/08/2011 Document Reviewed: 02/02/2008 [facility] Patient Information 2014 [place], [place].  Alcohol Use Disorder Alcohol use disorder is a mental disorder. It is not a one-time incident of heavy drinking. Alcohol use disorder is the excessive and uncontrollable use of alcohol over time that leads to problems with functioning in one or more areas of daily living. People with this disorder risk harming themselves and others when they drink to excess. Alcohol use disorder also can cause other mental disorders, such as mood and anxiety disorders, and serious physical problems. People with alcohol use disorder often misuse other drugs.  Alcohol use disorder is common and widespread. Some people with this disorder drink alcohol to cope with or escape from negative life events. Others drink to relieve chronic pain or symptoms of mental illness. People with a family history of alcohol use disorder are at higher risk of losing control and using alcohol to excess.  SYMPTOMS  Signs and symptoms of alcohol use disorder may include the following:   Consumption ofalcohol inlarger amounts or over a longer period of time than intended.  Multiple unsuccessful attempts to cutdown or control alcohol use.   A great deal of time spent obtaining alcohol, using alcohol, or recovering from the effects of alcohol (hangover).  A strong desire or urge to use alcohol (cravings).   Continued use of alcohol despite problems at work, school, or home because of alcohol use.   Continued use of alcohol despite problems in relationships because of alcohol use.  Continued  use of alcohol in situations when it is physically hazardous, such as driving a car.  Continued use of alcohol despite awareness of a physical or psychological problem that is likely related to alcohol use. Physical problems related to alcohol use can involve the brain, heart, liver, stomach, and intestines. Psychological problems related to alcohol use include intoxication, depression, anxiety, psychosis, delirium, and dementia.   The need for increased amounts of alcohol to achieve the same desired effect, or a decreased effect from the consumption of the same amount of alcohol (tolerance).  Withdrawal symptoms upon reducing or stopping alcohol use, or alcohol use to reduce or avoid withdrawal symptoms. Withdrawal symptoms include:  Racing heart.  Hand tremor.  Difficulty sleeping.  Nausea.  Vomiting.  Hallucinations.  Restlessness.  Seizures. DIAGNOSIS Alcohol use disorder is diagnosed through an assessment by your caregiver. Your caregiver may start by asking three or four questions to screen for excessive or problematic alcohol use. To confirm a diagnosis of alcohol use disorder, at least two symptoms (see SYMPTOMS) must be present within a 53-month period. The severity of alcohol use disorder depends on the number of symptoms:  Mild two or three.  Moderate four or five.  Severe six or more. Your caregiver may perform a physical  exam or use results from lab tests to see if you have physical problems resulting from alcohol use. Your caregiver may refer you to a mental health professional for evaluation. TREATMENT  Some people with alcohol use disorder are able to reduce their alcohol use to low-risk levels. Some people with alcohol use disorder need to quit drinking alcohol. When necessary, mental health professionals with specialized training in substance use treatment can help. Your caregiver can help you decide how severe your alcohol use disorder is and what type of  treatment you need. The following forms of treatment are available:   Detoxification. Detoxification involves the use of prescription medication to prevent alcohol withdrawal symptoms in the first week after quitting. This is important for people with a history of symptoms of withdrawal and for heavy drinkers who are likely to have withdrawal symptoms. Alcohol withdrawal can be dangerous and, in severe cases, cause death. Detoxification is usually provided in a hospital or in-patient substance use treatment facility.  Counseling or talk therapy. Talk therapy is provided by substance use treatment counselors. It addresses the reasons people use alcohol and ways to keep them from drinking again. The goals of talk therapy are to help people with alcohol use disorder find healthy activities and ways to cope with life stress, to identify and avoid triggers for alcohol use, and to handle cravings, which can cause relapse.  Medication.Different medications can help treat alcohol use disorder through the following actions:  Decrease alcohol cravings.  Decrease the positive reward response felt from alcohol use.  Produce an uncomfortable physical reaction when alcohol is used (aversion therapy).  Support groups. Support groups are run by people who have quit drinking. They provide emotional support, advice, and guidance. These forms of treatment are often combined. Some people with alcohol use disorder benefit from intensive combination treatment provided by specialized substance use treatment centers. Both inpatient and outpatient treatment programs are available. Document Released: 07/24/2004 Document Revised: 02/16/2013 Document Reviewed: 09/23/2012 [facility] Patient Information 2014 [place], [place].  Chemical Dependency Chemical dependency is an addiction to drugs or alcohol. It is characterized by the repeated behavior of seeking out and using drugs and alcohol despite harmful consequences to the  health and safety of ones self and others.  RISK FACTORS There are certain situations or behaviors that increase a person's risk for chemical dependency. These include:  A family history of chemical dependency.  A history of mental health issues, including depression and anxiety.  A home environment where drugs and alcohol are easily available to you.  Drug or alcohol use at a young age. SYMPTOMS  The following symptoms can indicate chemical dependency:  Inability to limit the use of drugs or alcohol.  Nausea, sweating, shakiness, and anxiety that occurs when alcohol or drugs are not being used.  An increase in amount of drugs or alcohol that is necessary to get drunk or high. People who experience these symptoms can assess their use of drugs and alcohol by asking themselves the following questions:  Have you been told by friends or family that they are worried about your use of alcohol or drugs?  Do friends and family ever tell you about things you did while drinking alcohol or using drugs that you do not remember?  Do you lie about using alcohol or drugs or about the amounts you use?  Do you have difficulty completing daily tasks unless you use alcohol or drugs?  Is the level of your work or school performance lower because of  your drug or alcohol use?  Do you get sick from using drugs or alcohol but keep using anyway?  Do you feel uncomfortable in social situations unless you use alcohol or drugs?  Do you use drugs or alcohol to help forget problems? An answer of yes to any of these questions may indicate chemical dependency. Professional evaluation is suggested. Document Released: 06/10/2001 Document Revised: 09/08/2011 Document Reviewed: 08/22/2010 [facility] Patient Information 2014 [place].

## 2013-08-23 NOTE — ED Notes (Signed)
Pt made aware that she will have to go without a lidocaine patch to be accepted to [facility]. Pt acknowledged understanding

## 2013-08-23 NOTE — Progress Notes (Signed)
The following detox facilities have been contacted regarding tx on pts behalf:  [facility]- per Dennie BiblePat beds available, referral faxed  [facility]- per Janus MolderAdolf beds available, referral faxed [facility]- per Al at capacity   [facility]

## 2013-09-12 ENCOUNTER — Encounter (HOSPITAL_COMMUNITY): Payer: Self-pay | Admitting: Emergency Medicine

## 2013-09-12 ENCOUNTER — Emergency Department (HOSPITAL_COMMUNITY): Payer: Medicaid Other

## 2013-09-12 ENCOUNTER — Emergency Department (HOSPITAL_COMMUNITY)
Admission: EM | Admit: 2013-09-12 | Discharge: 2013-09-13 | Disposition: A | Discharge status: Other Acute Inpatient Hospital | Payer: Medicaid Other | Attending: Emergency Medicine | Admitting: Emergency Medicine

## 2013-09-12 DIAGNOSIS — F1410 Cocaine abuse, uncomplicated: Secondary | ICD-10-CM | POA: Insufficient documentation

## 2013-09-12 DIAGNOSIS — F329 Major depressive disorder, single episode, unspecified: Secondary | ICD-10-CM

## 2013-09-12 DIAGNOSIS — F1020 Alcohol dependence, uncomplicated: Secondary | ICD-10-CM

## 2013-09-12 DIAGNOSIS — F3289 Other specified depressive episodes: Secondary | ICD-10-CM | POA: Insufficient documentation

## 2013-09-12 DIAGNOSIS — Z0289 Encounter for other administrative examinations: Secondary | ICD-10-CM | POA: Insufficient documentation

## 2013-09-12 DIAGNOSIS — F32A Depression, unspecified: Secondary | ICD-10-CM

## 2013-09-12 DIAGNOSIS — R6883 Chills (without fever): Secondary | ICD-10-CM | POA: Insufficient documentation

## 2013-09-12 DIAGNOSIS — J4489 Other specified chronic obstructive pulmonary disease: Secondary | ICD-10-CM | POA: Insufficient documentation

## 2013-09-12 DIAGNOSIS — Z79899 Other long term (current) drug therapy: Secondary | ICD-10-CM | POA: Insufficient documentation

## 2013-09-12 DIAGNOSIS — G479 Sleep disorder, unspecified: Secondary | ICD-10-CM | POA: Insufficient documentation

## 2013-09-12 DIAGNOSIS — F1010 Alcohol abuse, uncomplicated: Secondary | ICD-10-CM | POA: Insufficient documentation

## 2013-09-12 DIAGNOSIS — R112 Nausea with vomiting, unspecified: Secondary | ICD-10-CM | POA: Insufficient documentation

## 2013-09-12 DIAGNOSIS — R259 Unspecified abnormal involuntary movements: Secondary | ICD-10-CM | POA: Insufficient documentation

## 2013-09-12 DIAGNOSIS — R0602 Shortness of breath: Secondary | ICD-10-CM | POA: Insufficient documentation

## 2013-09-12 DIAGNOSIS — J449 Chronic obstructive pulmonary disease, unspecified: Secondary | ICD-10-CM | POA: Insufficient documentation

## 2013-09-12 LAB — ACETAMINOPHEN LEVEL: Acetaminophen (Tylenol), Serum: <15 ug/mL (ref 10–30)

## 2013-09-12 LAB — CBC
HCT: 45.3 % (ref 36.0–46.0)
Hemoglobin: 15.3 g/dL — ABNORMAL HIGH (ref 12.0–15.0)
MCH: 33.2 pg (ref 26.0–34.0)
MCHC: 33.8 g/dL (ref 30.0–36.0)
MCV: 98.3 fL (ref 78.0–100.0)
Platelets: 265 10*3/uL (ref 150–400)
RBC: 4.61 MIL/uL (ref 3.87–5.11)
RDW: 14.8 % (ref 11.5–15.5)
WBC: 10.8 10*3/uL — ABNORMAL HIGH (ref 4.0–10.5)

## 2013-09-12 LAB — COMPREHENSIVE METABOLIC PANEL WITH GFR
BUN: 6 mg/dL (ref 6–23)
Calcium: 9.4 mg/dL (ref 8.4–10.5)
Creatinine, Ser: 0.85 mg/dL (ref 0.50–1.10)
Glucose, Bld: 78 mg/dL (ref 70–99)
Total Protein: 8.2 g/dL (ref 6.0–8.3)

## 2013-09-12 LAB — TROPONIN I: Troponin I: <0.3 ng/mL (ref <0.30)

## 2013-09-12 LAB — COMPREHENSIVE METABOLIC PANEL
ALT: 43 U/L — ABNORMAL HIGH (ref 0–35)
AST: 42 U/L — ABNORMAL HIGH (ref 0–37)
Albumin: 3.9 g/dL (ref 3.5–5.2)
Alkaline Phosphatase: 100 U/L (ref 39–117)
CO2: 20 mEq/L (ref 19–32)
Chloride: 99 mEq/L (ref 96–112)
GFR calc Af Amer: >90 mL/min (ref >90)
GFR calc non Af Amer: 80 mL/min — ABNORMAL LOW (ref >90)
Potassium: 3.7 mEq/L (ref 3.7–5.3)
Sodium: 137 mEq/L (ref 137–147)
Total Bilirubin: 0.7 mg/dL (ref 0.3–1.2)

## 2013-09-12 LAB — SALICYLATE LEVEL: Salicylate Lvl: <2 mg/dL — ABNORMAL LOW (ref 2.8–20.0)

## 2013-09-12 LAB — RAPID URINE DRUG SCREEN, HOSP PERFORMED
Amphetamines: NOT DETECTED
Barbiturates: NOT DETECTED
Benzodiazepines: NOT DETECTED
Cocaine: POSITIVE — AB
Opiates: NOT DETECTED
Tetrahydrocannabinol: NOT DETECTED

## 2013-09-12 LAB — ETHANOL: Alcohol, Ethyl (B): 35 mg/dL — ABNORMAL HIGH (ref 0–11)

## 2013-09-12 MED ORDER — TIOTROPIUM BROMIDE MONOHYDRATE 18 MCG IN CAPS
18.0000 ug | ORAL_CAPSULE | Freq: Every day | RESPIRATORY_TRACT | Status: DC
  Administered 2013-09-13: 18 ug via RESPIRATORY_TRACT
  Filled 2013-09-12: qty 5

## 2013-09-12 MED ORDER — LORAZEPAM 1 MG PO TABS: 0.0000 mg | ORAL_TABLET | Freq: Four times a day (QID) | ORAL | Status: DC

## 2013-09-12 MED ORDER — IPRATROPIUM-ALBUTEROL 18-103 MCG/ACT IN AERO: 2.0000 | INHALATION_SPRAY | Freq: Four times a day (QID) | RESPIRATORY_TRACT | Status: DC | PRN

## 2013-09-12 MED ORDER — PREDNISONE 20 MG PO TABS
60.0000 mg | ORAL_TABLET | Freq: Once | ORAL | Status: AC
  Administered 2013-09-12: 60 mg via ORAL
  Filled 2013-09-12: qty 3

## 2013-09-12 MED ORDER — CITALOPRAM HYDROBROMIDE 10 MG PO TABS
10.0000 mg | ORAL_TABLET | Freq: Every day | ORAL | Status: DC
  Administered 2013-09-13: 10 mg via ORAL
  Filled 2013-09-12: qty 1

## 2013-09-12 MED ORDER — ALBUTEROL SULFATE (2.5 MG/3ML) 0.083% IN NEBU
2.5000 mg | INHALATION_SOLUTION | Freq: Once | RESPIRATORY_TRACT | Status: AC
  Administered 2013-09-12: 2.5 mg via RESPIRATORY_TRACT
  Filled 2013-09-12: qty 3

## 2013-09-12 MED ORDER — ALBUTEROL SULFATE (2.5 MG/3ML) 0.083% IN NEBU
5.0000 mg | INHALATION_SOLUTION | Freq: Once | RESPIRATORY_TRACT | Status: AC
  Administered 2013-09-12: 5 mg via RESPIRATORY_TRACT
  Filled 2013-09-12: qty 6

## 2013-09-12 MED ORDER — LORAZEPAM 1 MG PO TABS
1.0000 mg | ORAL_TABLET | Freq: Three times a day (TID) | ORAL | Status: DC | PRN
Start: 2013-09-12 — End: 2013-09-12

## 2013-09-12 MED ORDER — THIAMINE HCL 100 MG/ML IJ SOLN: 100.0000 mg | Freq: Every day | INTRAMUSCULAR | Status: DC

## 2013-09-12 MED ORDER — ALBUTEROL SULFATE HFA 108 (90 BASE) MCG/ACT IN AERS
2.0000 | INHALATION_SPRAY | Freq: Four times a day (QID) | RESPIRATORY_TRACT | Status: DC | PRN
  Filled 2013-09-12: qty 6.7

## 2013-09-12 MED ORDER — LORAZEPAM 1 MG PO TABS
0.0000 mg | ORAL_TABLET | Freq: Two times a day (BID) | ORAL | Status: DC
  Filled 2013-09-12: qty 2

## 2013-09-12 MED ORDER — QUETIAPINE FUMARATE ER 50 MG PO TB24
150.0000 mg | ORAL_TABLET | Freq: Every day | ORAL | Status: DC
  Administered 2013-09-13: 150 mg via ORAL
  Filled 2013-09-12 (×2): qty 3

## 2013-09-12 MED ORDER — ONDANSETRON HCL 4 MG PO TABS
4.0000 mg | ORAL_TABLET | Freq: Three times a day (TID) | ORAL | Status: DC | PRN
  Administered 2013-09-12: 4 mg via ORAL
  Filled 2013-09-12: qty 1

## 2013-09-12 MED ORDER — VITAMIN B-1 100 MG PO TABS
100.0000 mg | ORAL_TABLET | Freq: Every day | ORAL | Status: DC
  Administered 2013-09-12 – 2013-09-13 (×2): 100 mg via ORAL
  Filled 2013-09-12 (×2): qty 1

## 2013-09-12 MED ORDER — LORAZEPAM 2 MG/ML IJ SOLN: 0.0000 mg | Freq: Two times a day (BID) | INTRAMUSCULAR | Status: DC

## 2013-09-12 MED ORDER — NICOTINE 21 MG/24HR TD PT24
21.0000 mg | MEDICATED_PATCH | Freq: Every day | TRANSDERMAL | Status: DC
  Administered 2013-09-12 – 2013-09-13 (×2): 21 mg via TRANSDERMAL
  Filled 2013-09-12 (×2): qty 1

## 2013-09-12 MED ORDER — AMITRIPTYLINE HCL 10 MG PO TABS
10.0000 mg | ORAL_TABLET | Freq: Every day | ORAL | Status: DC
  Administered 2013-09-13: 10 mg via ORAL
  Filled 2013-09-12 (×2): qty 1

## 2013-09-12 MED ORDER — GABAPENTIN 400 MG PO CAPS
800.0000 mg | ORAL_CAPSULE | Freq: Three times a day (TID) | ORAL | Status: DC
  Administered 2013-09-13 (×2): 800 mg via ORAL
  Filled 2013-09-12 (×4): qty 2

## 2013-09-12 MED ORDER — HYDROXYZINE HCL 25 MG PO TABS: 25.0000 mg | ORAL_TABLET | Freq: Four times a day (QID) | ORAL | Status: DC | PRN

## 2013-09-12 MED ORDER — METHOCARBAMOL 500 MG PO TABS: 500.0000 mg | ORAL_TABLET | Freq: Three times a day (TID) | ORAL | Status: DC | PRN

## 2013-09-12 MED ORDER — ONDANSETRON 8 MG PO TBDP
8.0000 mg | ORAL_TABLET | Freq: Once | ORAL | Status: AC
  Administered 2013-09-12: 8 mg via ORAL
  Filled 2013-09-12: qty 1

## 2013-09-12 MED ORDER — LORAZEPAM 2 MG/ML IJ SOLN: 0.0000 mg | Freq: Four times a day (QID) | INTRAMUSCULAR | Status: DC

## 2013-09-12 MED ORDER — LORAZEPAM 1 MG PO TABS: 0.0000 mg | ORAL_TABLET | Freq: Two times a day (BID) | ORAL | Status: DC

## 2013-09-12 MED ORDER — LORAZEPAM 1 MG PO TABS
2.0000 mg | ORAL_TABLET | Freq: Once | ORAL | Status: AC
  Administered 2013-09-12: 2 mg via ORAL

## 2013-09-12 NOTE — ED Provider Notes (Signed)
Medical screening examination/treatment/procedure(s) were performed by non-physician practitioner and as supervising physician I was immediately available for consultation/collaboration.   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 Oletta LamasGhim, MD 09/12/13 [id number]

## 2013-09-12 NOTE — ED Notes (Signed)
Pt presents with c/o shortness of breath. Pt has a hx of COPD, says her symptoms started about 3 days ago. Pt says she is on five inhalers per day and she is out of all of her medication, including her mental health medication. Pt was seen here recently for detox about a month ago and given outpatient referrals. Pt says she started drinking again and doing cocaine and is requesting inpatient detox. Pt is in NAD.
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
## 2013-09-12 NOTE — ED Notes (Signed)
Pt was at89- 94 % with heart rate of 111 while walking

## 2013-09-13 ENCOUNTER — Encounter (HOSPITAL_COMMUNITY): Payer: Self-pay | Admitting: *Deleted

## 2013-09-13 ENCOUNTER — Inpatient Hospital Stay (HOSPITAL_COMMUNITY)
Admission: AD | Admit: 2013-09-13 | Discharge: 2013-09-16 | DRG: 897 | Disposition: A | Discharge status: Home / Self Care | Payer: Medicaid Other | Source: Intra-hospital | Attending: Psychiatry | Admitting: Psychiatry

## 2013-09-13 DIAGNOSIS — F1310 Sedative, hypnotic or anxiolytic abuse, uncomplicated: Secondary | ICD-10-CM

## 2013-09-13 DIAGNOSIS — J4489 Other specified chronic obstructive pulmonary disease: Secondary | ICD-10-CM | POA: Diagnosis present

## 2013-09-13 DIAGNOSIS — F329 Major depressive disorder, single episode, unspecified: Secondary | ICD-10-CM

## 2013-09-13 DIAGNOSIS — F17200 Nicotine dependence, unspecified, uncomplicated: Secondary | ICD-10-CM | POA: Diagnosis present

## 2013-09-13 DIAGNOSIS — F32A Depression, unspecified: Secondary | ICD-10-CM

## 2013-09-13 DIAGNOSIS — F332 Major depressive disorder, recurrent severe without psychotic features: Secondary | ICD-10-CM | POA: Diagnosis present

## 2013-09-13 DIAGNOSIS — F1110 Opioid abuse, uncomplicated: Secondary | ICD-10-CM

## 2013-09-13 DIAGNOSIS — F4310 Post-traumatic stress disorder, unspecified: Secondary | ICD-10-CM

## 2013-09-13 DIAGNOSIS — F1910 Other psychoactive substance abuse, uncomplicated: Secondary | ICD-10-CM

## 2013-09-13 DIAGNOSIS — G4700 Insomnia, unspecified: Secondary | ICD-10-CM | POA: Diagnosis present

## 2013-09-13 DIAGNOSIS — F1994 Other psychoactive substance use, unspecified with psychoactive substance-induced mood disorder: Secondary | ICD-10-CM | POA: Diagnosis present

## 2013-09-13 DIAGNOSIS — F1020 Alcohol dependence, uncomplicated: Principal | ICD-10-CM

## 2013-09-13 DIAGNOSIS — F411 Generalized anxiety disorder: Secondary | ICD-10-CM | POA: Diagnosis present

## 2013-09-13 DIAGNOSIS — Z823 Family history of stroke: Secondary | ICD-10-CM

## 2013-09-13 DIAGNOSIS — K219 Gastro-esophageal reflux disease without esophagitis: Secondary | ICD-10-CM | POA: Diagnosis present

## 2013-09-13 DIAGNOSIS — F319 Bipolar disorder, unspecified: Secondary | ICD-10-CM

## 2013-09-13 DIAGNOSIS — J449 Chronic obstructive pulmonary disease, unspecified: Secondary | ICD-10-CM | POA: Diagnosis present

## 2013-09-13 DIAGNOSIS — F1010 Alcohol abuse, uncomplicated: Secondary | ICD-10-CM

## 2013-09-13 MED ORDER — AMITRIPTYLINE HCL 10 MG PO TABS
10.0000 mg | ORAL_TABLET | Freq: Every day | ORAL | Status: DC
  Administered 2013-09-14 – 2013-09-15 (×2): 10 mg via ORAL
  Filled 2013-09-13: qty 3
  Filled 2013-09-13 (×4): qty 1

## 2013-09-13 MED ORDER — TRAZODONE HCL 100 MG PO TABS
100.0000 mg | ORAL_TABLET | Freq: Every evening | ORAL | Status: DC | PRN
  Administered 2013-09-13: 100 mg via ORAL
  Filled 2013-09-13: qty 1

## 2013-09-13 MED ORDER — MAGNESIUM HYDROXIDE 400 MG/5ML PO SUSP
30.0000 mL | Freq: Every day | ORAL | Status: DC | PRN
  Administered 2013-09-15: 30 mL via ORAL

## 2013-09-13 MED ORDER — NICOTINE 21 MG/24HR TD PT24
21.0000 mg | MEDICATED_PATCH | Freq: Every day | TRANSDERMAL | Status: DC
  Administered 2013-09-14 – 2013-09-16 (×3): 21 mg via TRANSDERMAL
  Filled 2013-09-13 (×5): qty 1

## 2013-09-13 MED ORDER — CHLORDIAZEPOXIDE HCL 25 MG PO CAPS
25.0000 mg | ORAL_CAPSULE | Freq: Once | ORAL | Status: DC
  Filled 2013-09-13: qty 1

## 2013-09-13 MED ORDER — HYDROXYZINE HCL 25 MG PO TABS
25.0000 mg | ORAL_TABLET | Freq: Four times a day (QID) | ORAL | Status: AC | PRN
Start: 2013-09-13 — End: 2013-09-16
  Administered 2013-09-13 – 2013-09-16 (×7): 25 mg via ORAL
  Filled 2013-09-13 (×7): qty 1

## 2013-09-13 MED ORDER — ACETAMINOPHEN 325 MG PO TABS
650.0000 mg | ORAL_TABLET | Freq: Four times a day (QID) | ORAL | Status: DC | PRN
Start: 2013-09-13 — End: 2013-09-16
  Administered 2013-09-14 (×2): 650 mg via ORAL
  Filled 2013-09-13 (×2): qty 2

## 2013-09-13 MED ORDER — CHLORDIAZEPOXIDE HCL 25 MG PO CAPS
25.0000 mg | ORAL_CAPSULE | Freq: Every day | ORAL | Status: AC
  Administered 2013-09-16: 25 mg via ORAL

## 2013-09-13 MED ORDER — LOPERAMIDE HCL 2 MG PO CAPS: 2.0000 mg | ORAL_CAPSULE | ORAL | Status: AC | PRN

## 2013-09-13 MED ORDER — CHLORDIAZEPOXIDE HCL 25 MG PO CAPS
25.0000 mg | ORAL_CAPSULE | Freq: Four times a day (QID) | ORAL | Status: AC
  Administered 2013-09-13 (×2): 25 mg via ORAL
  Filled 2013-09-13: qty 1

## 2013-09-13 MED ORDER — CHLORDIAZEPOXIDE HCL 25 MG PO CAPS
25.0000 mg | ORAL_CAPSULE | Freq: Four times a day (QID) | ORAL | Status: AC | PRN
  Administered 2013-09-13: 25 mg via ORAL
  Filled 2013-09-13 (×2): qty 1

## 2013-09-13 MED ORDER — CHLORDIAZEPOXIDE HCL 25 MG PO CAPS
25.0000 mg | ORAL_CAPSULE | Freq: Three times a day (TID) | ORAL | Status: AC
  Administered 2013-09-14 (×3): 25 mg via ORAL
  Filled 2013-09-13 (×3): qty 1

## 2013-09-13 MED ORDER — ALUM & MAG HYDROXIDE-SIMETH 200-200-20 MG/5ML PO SUSP
30.0000 mL | ORAL | Status: DC | PRN
  Administered 2013-09-13 – 2013-09-14 (×2): 30 mL via ORAL

## 2013-09-13 MED ORDER — METHOCARBAMOL 500 MG PO TABS
500.0000 mg | ORAL_TABLET | Freq: Four times a day (QID) | ORAL | Status: DC
  Administered 2013-09-13 – 2013-09-16 (×10): 500 mg via ORAL
  Filled 2013-09-13 (×19): qty 1

## 2013-09-13 MED ORDER — THIAMINE HCL 100 MG/ML IJ SOLN
100.0000 mg | Freq: Once | INTRAMUSCULAR | Status: AC
  Administered 2013-09-13: 100 mg via INTRAMUSCULAR
  Filled 2013-09-13: qty 2

## 2013-09-13 MED ORDER — ONDANSETRON 4 MG PO TBDP
4.0000 mg | ORAL_TABLET | Freq: Four times a day (QID) | ORAL | Status: AC | PRN
Start: 2013-09-13 — End: 2013-09-16

## 2013-09-13 MED ORDER — VITAMIN B-1 100 MG PO TABS
100.0000 mg | ORAL_TABLET | Freq: Every day | ORAL | Status: DC
  Administered 2013-09-14 – 2013-09-16 (×3): 100 mg via ORAL
  Filled 2013-09-13 (×5): qty 1

## 2013-09-13 MED ORDER — ADULT MULTIVITAMIN W/MINERALS CH
1.0000 | ORAL_TABLET | Freq: Every day | ORAL | Status: DC
  Administered 2013-09-13 – 2013-09-16 (×4): 1 via ORAL
  Filled 2013-09-13 (×6): qty 1

## 2013-09-13 MED ORDER — CHLORDIAZEPOXIDE HCL 25 MG PO CAPS
25.0000 mg | ORAL_CAPSULE | ORAL | Status: AC
  Administered 2013-09-15 (×2): 25 mg via ORAL
  Filled 2013-09-13 (×2): qty 1

## 2013-09-13 MED ORDER — METHOCARBAMOL 500 MG PO TABS
500.0000 mg | ORAL_TABLET | Freq: Three times a day (TID) | ORAL | Status: DC
  Administered 2013-09-13: 500 mg via ORAL
  Filled 2013-09-13 (×6): qty 1

## 2013-09-13 MED ORDER — QUETIAPINE FUMARATE ER 50 MG PO TB24
150.0000 mg | ORAL_TABLET | Freq: Every day | ORAL | Status: DC
  Administered 2013-09-13 – 2013-09-15 (×3): 150 mg via ORAL
  Filled 2013-09-13: qty 3
  Filled 2013-09-13: qty 9
  Filled 2013-09-13 (×4): qty 3

## 2013-09-13 NOTE — [facility] Suicide Risk Assessment (Signed)
[facility] INPATIENT: Family/Significant Other Suicide Prevention Education   Suicide Prevention Education:  Education Completed; No one has been identified by the patient as the family member/significant other with whom the patient will be residing, and identified as the person(s) who will aid the patient in the event of a mental health crisis (suicidal ideations/suicide attempt).   Pt did not c/o SI at admission, nor have they endorsed SI during their stay here. SPE not required. SPI pamphlet provided to pt and he was encouraged to share information with his support network, ask questions, and talk about any concerns.   The suicide prevention education provided includes the following:  Suicide risk factors  Suicide prevention and interventions  National Suicide Hotline telephone number  [facility] assessment telephone number  [facility] Assistance 911  [facility] and/or Residential Mobile Crisis Unit telephone number  Reyes IvanChelsea Horton, [place] 09/13/2013  3:07 PM

## 2013-09-13 NOTE — Progress Notes (Signed)
  Writer consulted with Dr. Laury DeepPuthuvel regarding the patient.  Per, Dr. Laury DeepPuthuvel the patient meets criteria for inpatient hospitalization.  [facility]  Writer informed the ER MD.   Writer informed Dr. Fonnie JarvisBednar and the nurse of the patients disposition.  [facility] will [facility] after shift exchange in the morning.  Incoming TTS will have the patient to sign the support paperwork.

## 2013-09-13 NOTE — ED Provider Notes (Signed)
[facility]. [phone]  Hurman HornJohn M Sebastiano Luecke, MD 09/16/13 [phone]

## 2013-09-13 NOTE — Progress Notes (Signed)
Adult Psychoeducational Group Note  Date:  09/13/2013 Time:  10:00 am  Group Topic/Focus:  Recovery Goals:   The focus of this group is to identify appropriate goals for recovery and establish a plan to achieve them.  Participation Level:  Did Not Attend   Charlotte Fisher, Charlotte Fisher 09/13/2013, 1:52 PM
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
## 2013-09-13 NOTE — Progress Notes (Signed)
[facility] by Dr. Laury DeepPuthuvel to Dr. Dub MikesLugo. Pt to be transported by [facility]. CSW completed support paperwork. MHT to complete consent to release and fax to assessment.   Byrd HesselbachKristen Kimba Lottes, LCSW [phone]  ED CSW 09/13/2013 829am
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
## 2013-09-13 NOTE — Clinical Social Work Note (Signed)
CSW faxed a letter to pt's attorney, Aurther Loftom Maddox per pt's request [phone]).  Consent in the chart.  Letter informing attorney that pt is in the hospital and will not be present at her court date tomorrow.    Charlotte IvanChelsea Horton, LCSW 09/13/2013  3:43 PM

## 2013-09-13 NOTE — [facility] Counselor (Signed)
Adult Psychosocial Assessment Update Interdisciplinary Team  Previous [facility] Health Hospital admissions/discharges:  Admissions Discharges  Date: 06/01/13 Date: 06/06/13  Date: Date:  Date: Date:  Date: Date:  Date: Date:   Changes since the last Psychosocial Assessment (including adherence to outpatient mental health and/or substance abuse treatment, situational issues contributing to decompensation and/or relapse). Pt states that she came to the ED due to having breathing issues but was also requesting alcohol detox.  Pt states that since last admission she continues to be homeless, staying at different motels.  Pt states that [facility] and stayed 2 or 3 days, before leaving, due to it being close to Christmas.  Pt also states that she's [facility] to [facility], with appointment times, but has limited transportation and hasn't made it there yet.  Pt continues to drink daily as well as daily crack cocaine use.               Discharge Plan 1. Will you be returning to the same living situation after discharge?   Yes:  X [place] homeless in [place], staying between motels.  Pt will continue to be homeless upon d/c but is being referred for treatment.   No:      If no, what is your plan?           2. Would you like a referral for services when you are discharged? Yes:  X   If yes, for what services? Pt [facility] to [facility] for further inpatient treatment.  CSW will refer pt.    No:              Summary and Recommendations (to be completed by the evaluator) Patient is a 48 year old Caucasian Female with a diagnosis of Polysubstance Dependence and Mood Disorder NOS.  [place].  Patient will benefit from crisis stabilization, medication evaluation, group therapy and psycho education in addition to case management for discharge planning.                         Signature:   Carmina MillerHorton, Nthony Lefferts Nicole, 09/13/2013 2:57 PM

## 2013-09-13 NOTE — Progress Notes (Signed)
Recreation Therapy Notes  Animal-Assisted Activity/Therapy (AAA/T) Program Checklist/Progress Notes Patient Eligibility Criteria Checklist & Daily Group note for Rec Tx Intervention  Date: 03.17.2015 Time: 2:45am Location: [address] Dayroom   AAA/T Program Assumption of Risk Form signed by Patient/ or Parent Legal Guardian yes  Patient is free of allergies or sever asthma yes  Patient reports no fear of animals yes  Patient reports no history of cruelty to animals yes   Patient understands his/her participation is voluntary yes  Behavioral Response: DID NOT ATTEND   Charlotte Fisher L Charlotte Fisher, LRT/CTRS  Charlotte Fisher L 09/13/2013 4:20 PM 

## 2013-09-14 MED ORDER — IBUPROFEN 800 MG PO TABS
ORAL_TABLET | ORAL | Status: AC
  Administered 2013-09-14: 800 mg
  Filled 2013-09-14: qty 1

## 2013-09-14 MED ORDER — IBUPROFEN 600 MG PO TABS
600.0000 mg | ORAL_TABLET | Freq: Four times a day (QID) | ORAL | Status: DC | PRN
  Administered 2013-09-15 – 2013-09-16 (×3): 600 mg via ORAL
  Filled 2013-09-14 (×3): qty 1

## 2013-09-14 MED ORDER — IBUPROFEN 800 MG PO TABS
800.0000 mg | ORAL_TABLET | Freq: Once | ORAL | Status: AC
  Filled 2013-09-14: qty 1

## 2013-09-14 NOTE — [facility] Group Notes (Signed)
[facility] Note   09/14/2013 11:08 AM  Participation Quality:  DID NOT ATTEND-pt did not get out of bed when asked to come to group.   Smart, American FinancialHeather LCSWA
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
## 2013-09-14 NOTE — Progress Notes (Signed)
D: Pt denies SI at this time. Pt reports feeling depressed d/t her relationship problems. Pt presents with flat affect and depressed mood. Pt isolates in her room and refused to attend groups this morning. Pt exhibiting med seeking behaviors. Pt continues to ask for meds that were already administered. Meds explained to pt this morning. Pt continues to request for more meds. A: Medications administered as ordered per MD. Verbal support given. Pt encouraged to attend groups. 15 minute checks performed for safety. R: Pt safety maintained at this time.

## 2013-09-14 NOTE — Progress Notes (Signed)
Pt observed in her room in bed with complaints of severe back pain.  Pt has spoken with the NP who ordered a one time dose of Ibuprofen 800mg  to be given now.  Pt reports her pain stays at a 10/10.  Pt also saying she is having abdominal bloating and swelling in her R ankle.  NP was mad aware.  Pt denies SI/HI/AV, but does say she has fleeting thoughts to kill herself.  She feels overwhelmed with her life situation.  Pt makes her needs known to staff.  [facility] after her detox.  Support and encouragement offered.  Safety maintained with q15 minute checks.
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
## 2013-09-14 NOTE — Progress Notes (Signed)
Pt has been to the med window multiple times since change of shift at 1900.  At other times, pt is in her room in bed.  Pt focused on meds and chronic pain.  Pt denies SI/HI/AV.  Pt says she is here because her lawyer suggested she get help with detox because she has a court date tomorrow, 3/18, for larceny.  Pt makes her needs known to staff.  Support and encouragement offered.  Safety maintained with q15 minute checks.

## 2013-09-14 NOTE — [facility] Group Notes (Signed)
[facility] LCSW Group Therapy  09/14/2013 3:09 PM  Type of Therapy:  Group Therapy  Participation Level:  Active  Participation Quality:  Attentive  Affect:  Appropriate  Cognitive:  Alert and Oriented  Insight:  Engaged  Engagement in Therapy:  Improving  Modes of Intervention:  Confrontation, Discussion, Education, Exploration, Problem-solving, Rapport Building, Socialization and Support  Summary of Progress/Problems: Emotion Regulation: This group focused on both positive and negative emotion identification and allowed group members to process ways to identify feelings, regulate negative emotions, and find healthy ways to manage internal/external emotions. Group members were asked to reflect on a time when their reaction to an emotion led to a negative outcome and explored how alternative responses using emotion regulation would have benefited them. Group members were also asked to discuss a time when emotion regulation was utilized when a negative emotion was experienced. Charlotte Fisher was attentive and engaged throughout today's therapy group. She shared that she struggles with depression/anxiety. [name] shows progress in the group setting and improving insight AEB her ability to process how [facility] help her learn positive coping skills and be in a supportive environment. Georgean processed how drinking was a short term fix to coping with negative emotions but was able to identify the long term negative effects that stem from substance abuse.    Smart, Yasmene Salomone LCSWA  09/14/2013, 3:09 PM

## 2013-09-15 DIAGNOSIS — F1994 Other psychoactive substance use, unspecified with psychoactive substance-induced mood disorder: Secondary | ICD-10-CM

## 2013-09-15 DIAGNOSIS — F1010 Alcohol abuse, uncomplicated: Secondary | ICD-10-CM

## 2013-09-15 DIAGNOSIS — F332 Major depressive disorder, recurrent severe without psychotic features: Secondary | ICD-10-CM

## 2013-09-15 DIAGNOSIS — F411 Generalized anxiety disorder: Secondary | ICD-10-CM

## 2013-09-15 MED ORDER — IPRATROPIUM-ALBUTEROL 20-100 MCG/ACT IN AERS: 1.0000 | INHALATION_SPRAY | Freq: Four times a day (QID) | RESPIRATORY_TRACT | Status: DC | PRN

## 2013-09-15 MED ORDER — ALBUTEROL SULFATE HFA 108 (90 BASE) MCG/ACT IN AERS
1.0000 | INHALATION_SPRAY | RESPIRATORY_TRACT | Status: DC | PRN
  Administered 2013-09-15 – 2013-09-16 (×2): 1 via RESPIRATORY_TRACT
  Filled 2013-09-15: qty 6.7

## 2013-09-15 MED ORDER — GABAPENTIN 400 MG PO CAPS
800.0000 mg | ORAL_CAPSULE | Freq: Every day | ORAL | Status: DC
  Administered 2013-09-15: 800 mg via ORAL
  Filled 2013-09-15 (×2): qty 2
  Filled 2013-09-15: qty 6

## 2013-09-15 MED ORDER — PANTOPRAZOLE SODIUM 40 MG PO TBEC
40.0000 mg | DELAYED_RELEASE_TABLET | Freq: Every day | ORAL | Status: DC
  Administered 2013-09-15 – 2013-09-16 (×2): 40 mg via ORAL
  Filled 2013-09-15 (×4): qty 1
  Filled 2013-09-15: qty 3

## 2013-09-15 NOTE — Progress Notes (Signed)
Pt attended AA meeting.  

## 2013-09-15 NOTE — Progress Notes (Signed)
D Pt. Denies SI and HI, does complain of constipation, writer offered MOM and prune juice.  A Clinical research associatewriter offered support and encouragement. Discussed patients day and rating of anxiety and pain.  R Pt. Remains safe on the unit.  Abdomen was nondistended and soft.  Bowels sounds were present.  Pt. Report she always has pain and  Is ready for the scheduled pain medication, but is going to take a hot shower first.  Rates her day as "great", anxiety a 3 and depression a 3, hopelessness a 9. Pt. [facility].
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
## 2013-09-15 NOTE — [facility] Group Notes (Signed)
[facility] LCSW Group Therapy  09/15/2013 2:55 PM  Type of Therapy:  Group Therapy  Participation Level:  Active  Participation Quality:  Intrusive  Affect:  Blunted  Cognitive:  Alert, Appropriate and Oriented  Insight:  Limited  Engagement in Therapy:  Engaged  Modes of Intervention:  Discussion, Exploration, Socialization and Support  Summary of Progress/Problems:Charlotte Fisher was very fixated on her discharge plan and remaining sober at DC. She would interrupt the group at times and remain off topic only talking about her long term treatment and use of alcohol for over 20 years.  LCSW discussed at length the plan for DC and looking at options to help alleviate some anxiety associated with discharge.  Charlotte Fisher became upset during group when another member shared no true feelings associated with life causing her to share personal stories of losing loved ones to suicide. She was able to calm herself down and relate to the individual by discussing motivation and how she found motivation to get clean.  Charlotte Fisher was supportive of other members and discussed how her plan will support her needs towards recovery.  Charlotte Fisher, Charlotte Fisher N 09/15/2013, 2:55 PM

## 2013-09-15 NOTE — Progress Notes (Signed)
0900 morning group  The focus of this group is to educate the patient on the purpose and policies of crisis stabilization and provide a format to answer questions about their admission.  The group details unit policies and expectations of patients while admitted.  Pt was an active participant and appropriate.  She stated," I want to not sleep all day"

## 2013-09-15 NOTE — Progress Notes (Signed)
Recreation Therapy Notes  Animal-Assisted Activity/Therapy (AAA/T) Program Checklist/Progress Notes Patient Eligibility Criteria Checklist & Daily Group note for Rec Tx Intervention  Date: 03.19.2015 Time: 2:45pm Location: [address] Dayroom   AAA/T Program Assumption of Risk Form signed by Patient/ or Parent Legal Guardian yes  Patient is free of allergies or sever asthma yes  Patient reports no fear of animals yes  Patient reports no history of cruelty to animals yes   Patient understands his/her participation is voluntary yes  Behavioral Response: DID NOT ATTEND.   Talley Casco L Braelynne Garinger, LRT/CTRS  Ladeja Pelham L 09/15/2013 4:37 PM 

## 2013-09-16 DIAGNOSIS — F1020 Alcohol dependence, uncomplicated: Principal | ICD-10-CM

## 2013-09-16 MED ORDER — PANTOPRAZOLE SODIUM 40 MG PO TBEC: 40.0000 mg | DELAYED_RELEASE_TABLET | Freq: Every day | ORAL | Status: DC

## 2013-09-16 MED ORDER — HYDROXYZINE HCL 25 MG PO TABS
25.0000 mg | ORAL_TABLET | Freq: Three times a day (TID) | ORAL | Status: DC
  Filled 2013-09-16: qty 9

## 2013-09-16 MED ORDER — AMITRIPTYLINE HCL 10 MG PO TABS: 10.0000 mg | ORAL_TABLET | Freq: Every day | ORAL | Status: DC

## 2013-09-16 MED ORDER — HYDROXYZINE HCL 25 MG PO TABS: ORAL_TABLET | ORAL | Status: DC

## 2013-09-16 MED ORDER — QUETIAPINE FUMARATE ER 150 MG PO TB24: 150.0000 mg | ORAL_TABLET | Freq: Every day | ORAL | Status: DC

## 2013-09-16 MED ORDER — ALBUTEROL SULFATE HFA 108 (90 BASE) MCG/ACT IN AERS: 2.0000 | INHALATION_SPRAY | Freq: Four times a day (QID) | RESPIRATORY_TRACT | Status: DC | PRN

## 2013-09-16 MED ORDER — IPRATROPIUM-ALBUTEROL 18-103 MCG/ACT IN AERO: 2.0000 | INHALATION_SPRAY | Freq: Four times a day (QID) | RESPIRATORY_TRACT | Status: DC | PRN

## 2013-09-16 MED ORDER — GABAPENTIN 400 MG PO CAPS: 800.0000 mg | ORAL_CAPSULE | Freq: Every day | ORAL | Status: DC

## 2013-09-16 NOTE — Progress Notes (Signed)
D  Pt in bed resting with eyes closed  No distress noted A   Continue to monitor Q 15 min R   Pt safe at present 

## 2013-09-16 NOTE — [facility] Group Notes (Signed)
[facility] LCSW Group Therapy  09/16/2013 2:42 PM  Type of Therapy:  Group Therapy  Participation Level:  Active  Participation Quality:  Inattentive  Affect:  Excited  Cognitive:  Alert and Oriented  Insight:  Limited  Engagement in Therapy:  Lacking  Modes of Intervention:  Discussion, Exploration, Problem-solving and Support  Summary of Progress/Problems:  Group discussion centered around the topic of recovery with patient's processing feelings associated with self-sabatoge feelings of failure, and improving outcomes.  Melainie was in and out of group and while in group coloring or looking at a magazine.  She would become intrusive having trouble holding her thoughts or agreeing with others when she liked what they said.  She reports feeling very excited about going to rehab, getting her life back together, and utilizing hobbies as a way to remain free from relapse.    Raye SorrowCoble, Shameer Molstad N 09/16/2013, 2:42 PM
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
## 2013-09-16 NOTE — Progress Notes (Signed)
Adult Psychoeducational Group Note  Date:  09/16/2013 Time:  10:00AM  Group Topic/Focus:  Relapse Prevention Planning:   The focus of this group is to define relapse and discuss the need for planning to combat relapse.  Participation Level:  Active  Participation Quality:  Appropriate and Attentive  Affect:  Appropriate  Cognitive:  Appropriate  Insight: Appropriate  Engagement in Group:  Engaged  Modes of Intervention:  Discussion  Additional Comments:  Pt was asked to describe their motivation to remain clean and share something awesome about themselves. Pt spoke about how her grandchildren and her wanting to live were her biggest motivations for wanting to stay clean; she explained how she wants to be a more active grandparent to all 4811 of her grandchildren. She indicated that having a family that loves her despite her alcoholism was something special about herself.   Zacarias PontesSmith, Primitivo Merkey R 09/16/2013, 10:49 AM

## 2013-09-16 NOTE — [facility] Group Notes (Signed)
[facility]   09/16/2013 9:38 AM  Participation Quality:  Intrusive/ Monolpolizing  Mood/Affect:  Anxious   Reports feeling irritable  Depression Rating:  denied  Anxiety Rating:  8  Thoughts of Suicide:  No Will you contract for safety?   Yes  Current AVH:  No  Plan for Discharge/Comments:  [name] has requested a referral for [facility].  Bed potentially available on Monday.  [name] is worried about her transportation, requesting DC today or tomorrow so her mother can transport one time instead of multiple, as there are financial barriers. She [facility] also [facility].  [name] was very manic this morning, reporting feeling irritable and annoyed. Her mood is elevated along with rapid thoughts with intrusive means of interrupting others when speaking.  Increased self-awareness, but poor self control.  Transportation Means: mother  Supports:  Mother and family.  Raye Sorrowoble, Jermaine Neuharth N
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
## 2013-09-16 NOTE — Progress Notes (Signed)
[facility] :  Will you be returning to the same living situation after discharge: Yes,  home At discharge, do you have transportation home?:Yes,  patient mother coming to pick her up Do you have the ability to pay for your medications:Yes,  no barriers  Release of information consent forms completed and in the chart;  Patient's signature needed at discharge.  Patient to Follow up at: Follow-up Information   [facility]. (Call Sunday to verify bed availability. Arrive by 8am for screening and possible admission. Make sure [place], medication supply, and clothing. )    Contact information:   [address] [place], [place] [address] Phone: [phone] Fax: [phone]      [facility] (Walk in on for a hospital discharge appointment. Walk in clinic is Monday - Friday 8 am - 3 pm. They will than schedule you for medication mangement and therapy. )    Contact information:   [address] [place], [place] [address] Phone: [phone] Fax: [phone]      Patient denies SI/HI:   Yes,  no reports of SI    Safety Planning and Suicide Prevention discussed:  Yes,  see SI note.  Raye SorrowCoble, Thoren Hosang N 09/16/2013, 11:47 AM

## 2013-09-20 NOTE — Progress Notes (Signed)
Patient Discharge Instructions:  After Visit Summary (AVS):   Faxed to:  09/20/13 Discharge Summary Note:   Faxed to:  09/20/13 Psychiatric Admission Assessment Note:   Faxed to:  09/20/13 Suicide Risk Assessment - Discharge Assessment:   Faxed to:  09/20/13 Faxed/Sent to the Next Level Care provider:  09/20/13 [facility] @ [phone] [facility] @ [phone]  Jerelene ReddenSheena E Haines, 09/20/2013, 3:51 PM

## 2013-10-10 ENCOUNTER — Emergency Department (HOSPITAL_COMMUNITY): Payer: Medicaid Other

## 2013-10-10 ENCOUNTER — Encounter (HOSPITAL_COMMUNITY): Payer: Self-pay | Admitting: Emergency Medicine

## 2013-10-10 ENCOUNTER — Emergency Department (HOSPITAL_COMMUNITY)
Admission: EM | Admit: 2013-10-10 | Discharge: 2013-10-10 | Disposition: A | Discharge status: Home / Self Care | Payer: Medicaid Other | Attending: Emergency Medicine | Admitting: Emergency Medicine

## 2013-10-10 DIAGNOSIS — F3289 Other specified depressive episodes: Secondary | ICD-10-CM | POA: Insufficient documentation

## 2013-10-10 DIAGNOSIS — Z23 Encounter for immunization: Secondary | ICD-10-CM | POA: Insufficient documentation

## 2013-10-10 DIAGNOSIS — F329 Major depressive disorder, single episode, unspecified: Secondary | ICD-10-CM | POA: Insufficient documentation

## 2013-10-10 DIAGNOSIS — S0181XA Laceration without foreign body of other part of head, initial encounter: Secondary | ICD-10-CM

## 2013-10-10 DIAGNOSIS — F10929 Alcohol use, unspecified with intoxication, unspecified: Secondary | ICD-10-CM

## 2013-10-10 DIAGNOSIS — F17200 Nicotine dependence, unspecified, uncomplicated: Secondary | ICD-10-CM | POA: Insufficient documentation

## 2013-10-10 DIAGNOSIS — Z79899 Other long term (current) drug therapy: Secondary | ICD-10-CM | POA: Insufficient documentation

## 2013-10-10 DIAGNOSIS — J45901 Unspecified asthma with (acute) exacerbation: Secondary | ICD-10-CM

## 2013-10-10 DIAGNOSIS — J441 Chronic obstructive pulmonary disease with (acute) exacerbation: Secondary | ICD-10-CM | POA: Insufficient documentation

## 2013-10-10 DIAGNOSIS — F411 Generalized anxiety disorder: Secondary | ICD-10-CM | POA: Insufficient documentation

## 2013-10-10 DIAGNOSIS — F1020 Alcohol dependence, uncomplicated: Secondary | ICD-10-CM | POA: Insufficient documentation

## 2013-10-10 DIAGNOSIS — S0180XA Unspecified open wound of other part of head, initial encounter: Secondary | ICD-10-CM | POA: Insufficient documentation

## 2013-10-10 MED ORDER — ALBUTEROL SULFATE HFA 108 (90 BASE) MCG/ACT IN AERS
2.0000 | INHALATION_SPRAY | Freq: Once | RESPIRATORY_TRACT | Status: AC
  Administered 2013-10-10: 2 via RESPIRATORY_TRACT
  Filled 2013-10-10: qty 6.7

## 2013-10-10 MED ORDER — LORAZEPAM 0.5 MG PO TABS
0.5000 mg | ORAL_TABLET | Freq: Once | ORAL | Status: AC
  Administered 2013-10-10: 0.5 mg via ORAL
  Filled 2013-10-10: qty 1

## 2013-10-10 MED ORDER — TETANUS-DIPHTH-ACELL PERTUSSIS 5-2.5-18.5 LF-MCG/0.5 IM SUSP
0.5000 mL | Freq: Once | INTRAMUSCULAR | Status: AC
  Administered 2013-10-10: 0.5 mL via INTRAMUSCULAR
  Filled 2013-10-10: qty 0.5

## 2013-10-10 MED ORDER — LORAZEPAM 1 MG PO TABS
1.0000 mg | ORAL_TABLET | Freq: Once | ORAL | Status: DC
Start: 2013-10-10 — End: 2013-10-10

## 2013-10-10 MED ORDER — IBUPROFEN 200 MG PO TABS
600.0000 mg | ORAL_TABLET | Freq: Once | ORAL | Status: AC
  Administered 2013-10-10: 600 mg via ORAL
  Filled 2013-10-10: qty 3
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
## 2013-10-10 NOTE — ED Provider Notes (Signed)
Medical screening examination/treatment/procedure(s) were performed by non-physician practitioner and as supervising physician I was immediately available for consultation/collaboration.   EKG Interpretation None        Lyanne CoKevin M Britian Jentz, MD 10/10/13 [phone]
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
## 2013-10-10 NOTE — Discharge Instructions (Signed)
Return in 5 days for suture removal. Apply bacitracin twice a day to prevent infection. Return to the ED sooner if symptoms worsen.  Laceration Care, Adult A laceration is a cut or lesion that goes through all layers of the skin and into the tissue just beneath the skin. TREATMENT  Some lacerations may not require closure. Some lacerations may not be able to be closed due to an increased risk of infection. It is important to see your caregiver as soon as possible after an injury to minimize the risk of infection and maximize the opportunity for successful closure. If closure is appropriate, pain medicines may be given, if needed. The wound will be cleaned to help prevent infection. Your caregiver will use stitches (sutures), staples, wound glue (adhesive), or skin adhesive strips to repair the laceration. These tools bring the skin edges together to allow for faster healing and a better cosmetic outcome. However, all wounds will heal with a scar. Once the wound has healed, scarring can be minimized by covering the wound with sunscreen during the day for 1 full year. HOME CARE INSTRUCTIONS  For sutures or staples:  Keep the wound clean and dry.  If you were given a bandage (dressing), you should change it at least once a day. Also, change the dressing if it becomes wet or dirty, or as directed by your caregiver.  Wash the wound with soap and water 2 times a day. Rinse the wound off with water to remove all soap. Pat the wound dry with a clean towel.  After cleaning, apply a thin layer of the antibiotic ointment as recommended by your caregiver. This will help prevent infection and keep the dressing from sticking.  You may shower as usual after the first 24 hours. Do not soak the wound in water until the sutures are removed.  Only take over-the-counter or prescription medicines for pain, discomfort, or fever as directed by your caregiver.  Get your sutures or staples removed as directed by your  caregiver. For skin adhesive strips:  Keep the wound clean and dry.  Do not get the skin adhesive strips wet. You may bathe carefully, using caution to keep the wound dry.  If the wound gets wet, pat it dry with a clean towel.  Skin adhesive strips will fall off on their own. You may trim the strips as the wound heals. Do not remove skin adhesive strips that are still stuck to the wound. They will fall off in time. For wound adhesive:  You may briefly wet your wound in the shower or bath. Do not soak or scrub the wound. Do not swim. Avoid periods of heavy perspiration until the skin adhesive has fallen off on its own. After showering or bathing, gently pat the wound dry with a clean towel.  Do not apply liquid medicine, cream medicine, or ointment medicine to your wound while the skin adhesive is in place. This may loosen the film before your wound is healed.  If a dressing is placed over the wound, be careful not to apply tape directly over the skin adhesive. This may cause the adhesive to be pulled off before the wound is healed.  Avoid prolonged exposure to sunlight or tanning lamps while the skin adhesive is in place. Exposure to ultraviolet light in the first year will darken the scar.  The skin adhesive will usually remain in place for 5 to 10 days, then naturally fall off the skin. Do not pick at the adhesive film. You  may need a tetanus shot if:  You cannot remember when you had your last tetanus shot.  You have never had a tetanus shot. If you get a tetanus shot, your arm may swell, get red, and feel warm to the touch. This is common and not a problem. If you need a tetanus shot and you choose not to have one, there is a rare chance of getting tetanus. Sickness from tetanus can be serious. SEEK MEDICAL CARE IF:   You have redness, swelling, or increasing pain in the wound.  You see a red line that goes away from the wound.  You have yellowish-white fluid (pus) coming from  the wound.  You have a fever.  You notice a bad smell coming from the wound or dressing.  Your wound breaks open before or after sutures have been removed.  You notice something coming out of the wound such as wood or glass.  Your wound is on your hand or foot and you cannot move a finger or toe. SEEK IMMEDIATE MEDICAL CARE IF:   Your pain is not controlled with prescribed medicine.  You have severe swelling around the wound causing pain and numbness or a change in color in your arm, hand, leg, or foot.  Your wound splits open and starts bleeding.  You have worsening numbness, weakness, or loss of function of any joint around or beyond the wound.  You develop painful lumps near the wound or on the skin anywhere on your body. MAKE SURE YOU:   Understand these instructions.  Will watch your condition.  Will get help right away if you are not doing well or get worse. Document Released: 06/16/2005 Document Revised: 09/08/2011 Document Reviewed: 12/10/2010 [facility] Patient Information 2014 [place], [place].

## 2013-10-10 NOTE — ED Notes (Signed)
Pt able to ambulate with no assistance  

## 2013-10-16 NOTE — ED Provider Notes (Signed)
Medical screening examination/treatment/procedure(s) were performed by non-physician practitioner and as supervising physician I was immediately available for consultation/collaboration.   EKG Interpretation None        Eniola Cerullo M Sincere Berlanga, MD 10/16/13 1408 

## 2013-11-02 ENCOUNTER — Emergency Department (HOSPITAL_COMMUNITY): Payer: Medicaid Other

## 2013-11-02 ENCOUNTER — Emergency Department (HOSPITAL_COMMUNITY)
Admission: EM | Admit: 2013-11-02 | Discharge: 2013-11-03 | Disposition: A | Discharge status: Home / Self Care | Payer: Medicaid Other | Attending: Emergency Medicine | Admitting: Emergency Medicine

## 2013-11-02 DIAGNOSIS — Y9289 Other specified places as the place of occurrence of the external cause: Secondary | ICD-10-CM | POA: Insufficient documentation

## 2013-11-02 DIAGNOSIS — Y9389 Activity, other specified: Secondary | ICD-10-CM | POA: Diagnosis not present

## 2013-11-02 DIAGNOSIS — S0990XA Unspecified injury of head, initial encounter: Secondary | ICD-10-CM | POA: Diagnosis present

## 2013-11-02 DIAGNOSIS — IMO0002 Reserved for concepts with insufficient information to code with codable children: Secondary | ICD-10-CM | POA: Diagnosis not present

## 2013-11-02 DIAGNOSIS — F411 Generalized anxiety disorder: Secondary | ICD-10-CM | POA: Diagnosis not present

## 2013-11-02 DIAGNOSIS — F3289 Other specified depressive episodes: Secondary | ICD-10-CM | POA: Insufficient documentation

## 2013-11-02 DIAGNOSIS — J449 Chronic obstructive pulmonary disease, unspecified: Secondary | ICD-10-CM | POA: Insufficient documentation

## 2013-11-02 DIAGNOSIS — F17200 Nicotine dependence, unspecified, uncomplicated: Secondary | ICD-10-CM | POA: Insufficient documentation

## 2013-11-02 DIAGNOSIS — R4182 Altered mental status, unspecified: Secondary | ICD-10-CM | POA: Diagnosis not present

## 2013-11-02 DIAGNOSIS — F10929 Alcohol use, unspecified with intoxication, unspecified: Secondary | ICD-10-CM

## 2013-11-02 DIAGNOSIS — S0100XA Unspecified open wound of scalp, initial encounter: Secondary | ICD-10-CM | POA: Diagnosis not present

## 2013-11-02 DIAGNOSIS — Z23 Encounter for immunization: Secondary | ICD-10-CM | POA: Insufficient documentation

## 2013-11-02 DIAGNOSIS — F1010 Alcohol abuse, uncomplicated: Secondary | ICD-10-CM | POA: Insufficient documentation

## 2013-11-02 DIAGNOSIS — J4489 Other specified chronic obstructive pulmonary disease: Secondary | ICD-10-CM | POA: Insufficient documentation

## 2013-11-02 DIAGNOSIS — Z8739 Personal history of other diseases of the musculoskeletal system and connective tissue: Secondary | ICD-10-CM | POA: Diagnosis not present

## 2013-11-02 DIAGNOSIS — W010XXA Fall on same level from slipping, tripping and stumbling without subsequent striking against object, initial encounter: Secondary | ICD-10-CM | POA: Diagnosis not present

## 2013-11-02 DIAGNOSIS — F329 Major depressive disorder, single episode, unspecified: Secondary | ICD-10-CM | POA: Diagnosis not present

## 2013-11-02 DIAGNOSIS — Z79899 Other long term (current) drug therapy: Secondary | ICD-10-CM | POA: Diagnosis not present

## 2013-11-02 LAB — ETHANOL: Alcohol, Ethyl (B): 317 mg/dL — ABNORMAL HIGH (ref 0–11)

## 2013-11-02 LAB — CBG MONITORING, ED: GLUCOSE-CAPILLARY: 86 mg/dL (ref 70–99)

## 2013-11-02 MED ORDER — THIAMINE HCL 100 MG/ML IJ SOLN
Freq: Once | INTRAVENOUS | Status: AC
  Administered 2013-11-02: via INTRAVENOUS
  Filled 2013-11-02: qty 1000
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
## 2013-11-02 NOTE — ED Notes (Signed)
Unable to verify hx due to pt's mental status
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
## 2013-11-03 MED ORDER — TETANUS-DIPHTH-ACELL PERTUSSIS 5-2.5-18.5 LF-MCG/0.5 IM SUSP
0.5000 mL | Freq: Once | INTRAMUSCULAR | Status: AC
  Administered 2013-11-03: 0.5 mL via INTRAMUSCULAR
  Filled 2013-11-03: qty 0.5

## 2013-11-03 NOTE — Discharge Instructions (Signed)
Alcohol Intoxication °Alcohol intoxication occurs when the amount of alcohol that a person has consumed impairs his or her ability to mentally and physically function. Alcohol directly impairs the normal chemical activity of the brain. Drinking large amounts of alcohol can lead to changes in mental function and behavior, and it can cause many physical effects that can be harmful.  °Alcohol intoxication can range in severity from mild to very severe. Various factors can affect the level of intoxication that occurs, such as the person's age, gender, weight, frequency of alcohol consumption, and the presence of other medical conditions (such as diabetes, seizures, or heart conditions). Dangerous levels of alcohol intoxication may occur when people drink large amounts of alcohol in a short period (binge drinking). Alcohol can also be especially dangerous when combined with certain prescription medicines or "recreational" drugs. °SIGNS AND SYMPTOMS °Some common signs and symptoms of mild alcohol intoxication include: °· Loss of coordination. °· Changes in mood and behavior. °· Impaired judgment. °· Slurred speech. °As alcohol intoxication progresses to more severe levels, other signs and symptoms will appear. These may include: °· Vomiting. °· Confusion and impaired memory. °· Slowed breathing. °· Seizures. °· Loss of consciousness. °DIAGNOSIS  °Your health care provider will take a medical history and perform a physical exam. You will be asked about the amount and type of alcohol you have consumed. Blood tests will be done to measure the concentration of alcohol in your blood. In many places, your blood alcohol level must be lower than 80 mg/dL (0.08%) to legally drive. However, many dangerous effects of alcohol can occur at much lower levels.  °TREATMENT  °People with alcohol intoxication often do not require treatment. Most of the effects of alcohol intoxication are temporary, and they go away as the alcohol naturally  leaves the body. Your health care provider will monitor your condition until you are stable enough to go home. Fluids are sometimes given through an IV access tube to help prevent dehydration.  °HOME CARE INSTRUCTIONS °· Do not drive after drinking alcohol. °· Stay hydrated. Drink enough water and fluids to keep your urine clear or pale yellow. Avoid caffeine.   °· Only take over-the-counter or prescription medicines as directed by your health care provider.   °SEEK MEDICAL CARE IF:  °· You have persistent vomiting.   °· You do not feel better after a few days. °· You have frequent alcohol intoxication. Your health care provider can help determine if you should see a substance use treatment counselor. °SEEK IMMEDIATE MEDICAL CARE IF:  °· You become shaky or tremble when you try to stop drinking.   °· You shake uncontrollably (seizure).   °· You throw up (vomit) blood. This may be bright red or may look like black coffee grounds.   °· You have blood in your stool. This may be bright red or may appear as a black, tarry, bad smelling stool.   °· You become lightheaded or faint.   °MAKE SURE YOU:  °· Understand these instructions. °· Will watch your condition. °· Will get help right away if you are not doing well or get worse. °Document Released: 03/26/2005 Document Revised: 02/16/2013 Document Reviewed: 11/19/2012 °Exit[employer]® Patient Information ©2014 [employer]. ° °

## 2013-11-03 NOTE — ED Notes (Signed)
Pt awake-slurred speech, cursing at staff-remains restrained for safety of patient and staff

## 2013-11-03 NOTE — ED Notes (Signed)
Pt is uncooperative and will not follow instruction to lie back in bed

## 2014-01-13 ENCOUNTER — Encounter (HOSPITAL_COMMUNITY): Payer: Self-pay | Admitting: *Deleted

## 2014-01-13 ENCOUNTER — Encounter (HOSPITAL_COMMUNITY): Payer: Self-pay | Admitting: Emergency Medicine

## 2014-01-13 ENCOUNTER — Inpatient Hospital Stay (HOSPITAL_COMMUNITY)
Admission: AD | Admit: 2014-01-13 | Discharge: 2014-01-16 | DRG: 897 | Disposition: A | Discharge status: Home / Self Care | Payer: Medicaid Other | Source: Intra-hospital | Attending: Psychiatry | Admitting: Psychiatry

## 2014-01-13 ENCOUNTER — Emergency Department (HOSPITAL_COMMUNITY)
Admission: EM | Admit: 2014-01-13 | Discharge: 2014-01-13 | Disposition: A | Discharge status: Other Acute Inpatient Hospital | Payer: Medicaid Other | Attending: Emergency Medicine | Admitting: Emergency Medicine

## 2014-01-13 DIAGNOSIS — Z823 Family history of stroke: Secondary | ICD-10-CM

## 2014-01-13 DIAGNOSIS — I10 Essential (primary) hypertension: Secondary | ICD-10-CM | POA: Diagnosis present

## 2014-01-13 DIAGNOSIS — F411 Generalized anxiety disorder: Secondary | ICD-10-CM | POA: Diagnosis present

## 2014-01-13 DIAGNOSIS — Z5987 Material hardship due to limited financial resources, not elsewhere classified: Secondary | ICD-10-CM

## 2014-01-13 DIAGNOSIS — J4489 Other specified chronic obstructive pulmonary disease: Secondary | ICD-10-CM | POA: Diagnosis present

## 2014-01-13 DIAGNOSIS — F3289 Other specified depressive episodes: Secondary | ICD-10-CM | POA: Insufficient documentation

## 2014-01-13 DIAGNOSIS — F1910 Other psychoactive substance abuse, uncomplicated: Secondary | ICD-10-CM | POA: Diagnosis present

## 2014-01-13 DIAGNOSIS — F1994 Other psychoactive substance use, unspecified with psychoactive substance-induced mood disorder: Secondary | ICD-10-CM | POA: Diagnosis present

## 2014-01-13 DIAGNOSIS — F1110 Opioid abuse, uncomplicated: Secondary | ICD-10-CM

## 2014-01-13 DIAGNOSIS — F1020 Alcohol dependence, uncomplicated: Secondary | ICD-10-CM | POA: Insufficient documentation

## 2014-01-13 DIAGNOSIS — Z5989 Other problems related to housing and economic circumstances: Secondary | ICD-10-CM | POA: Diagnosis not present

## 2014-01-13 DIAGNOSIS — G4700 Insomnia, unspecified: Secondary | ICD-10-CM | POA: Diagnosis present

## 2014-01-13 DIAGNOSIS — F410 Panic disorder [episodic paroxysmal anxiety] without agoraphobia: Secondary | ICD-10-CM | POA: Diagnosis present

## 2014-01-13 DIAGNOSIS — F329 Major depressive disorder, single episode, unspecified: Secondary | ICD-10-CM | POA: Diagnosis not present

## 2014-01-13 DIAGNOSIS — K219 Gastro-esophageal reflux disease without esophagitis: Secondary | ICD-10-CM | POA: Diagnosis present

## 2014-01-13 DIAGNOSIS — IMO0001 Reserved for inherently not codable concepts without codable children: Secondary | ICD-10-CM | POA: Diagnosis present

## 2014-01-13 DIAGNOSIS — J449 Chronic obstructive pulmonary disease, unspecified: Secondary | ICD-10-CM | POA: Diagnosis not present

## 2014-01-13 DIAGNOSIS — Z598 Other problems related to housing and economic circumstances: Secondary | ICD-10-CM

## 2014-01-13 DIAGNOSIS — Z79899 Other long term (current) drug therapy: Secondary | ICD-10-CM | POA: Diagnosis not present

## 2014-01-13 DIAGNOSIS — F32A Depression, unspecified: Secondary | ICD-10-CM

## 2014-01-13 DIAGNOSIS — F17200 Nicotine dependence, unspecified, uncomplicated: Secondary | ICD-10-CM | POA: Diagnosis not present

## 2014-01-13 DIAGNOSIS — F332 Major depressive disorder, recurrent severe without psychotic features: Secondary | ICD-10-CM | POA: Diagnosis present

## 2014-01-13 DIAGNOSIS — F259 Schizoaffective disorder, unspecified: Secondary | ICD-10-CM

## 2014-01-13 DIAGNOSIS — R000 Tachycardia, unspecified: Secondary | ICD-10-CM | POA: Diagnosis not present

## 2014-01-13 DIAGNOSIS — F1010 Alcohol abuse, uncomplicated: Secondary | ICD-10-CM | POA: Diagnosis present

## 2014-01-13 DIAGNOSIS — F1024 Alcohol dependence with alcohol-induced mood disorder: Secondary | ICD-10-CM

## 2014-01-13 DIAGNOSIS — F333 Major depressive disorder, recurrent, severe with psychotic symptoms: Secondary | ICD-10-CM

## 2014-01-13 HISTORY — DX: Emphysema (subcutaneous) resulting from a procedure, initial encounter: T81.82XA

## 2014-01-13 LAB — CBC
HCT: 42.8 % (ref 36.0–46.0)
Hemoglobin: 14.2 g/dL (ref 12.0–15.0)
MCH: 31.8 pg (ref 26.0–34.0)
MCHC: 33.2 g/dL (ref 30.0–36.0)
MCV: 96 fL (ref 78.0–100.0)
Platelets: 245 10*3/uL (ref 150–400)
RBC: 4.46 MIL/uL (ref 3.87–5.11)
RDW: 14.2 % (ref 11.5–15.5)
WBC: 8.7 10*3/uL (ref 4.0–10.5)

## 2014-01-13 LAB — COMPREHENSIVE METABOLIC PANEL
ALT: 35 U/L (ref 0–35)
AST: 35 U/L (ref 0–37)
Albumin: 3.6 g/dL (ref 3.5–5.2)
Alkaline Phosphatase: 90 U/L (ref 39–117)
Anion gap: 13 (ref 5–15)
BUN: 6 mg/dL (ref 6–23)
CO2: 23 mEq/L (ref 19–32)
Calcium: 9.4 mg/dL (ref 8.4–10.5)
Chloride: 102 mEq/L (ref 96–112)
Creatinine, Ser: 0.86 mg/dL (ref 0.50–1.10)
GFR calc Af Amer: >90 mL/min (ref >90)
GFR calc non Af Amer: 79 mL/min — ABNORMAL LOW (ref >90)
Glucose, Bld: 96 mg/dL (ref 70–99)
Potassium: 4.4 mEq/L (ref 3.7–5.3)
Sodium: 138 mEq/L (ref 137–147)
Total Bilirubin: 0.4 mg/dL (ref 0.3–1.2)
Total Protein: 7.8 g/dL (ref 6.0–8.3)

## 2014-01-13 LAB — RAPID URINE DRUG SCREEN, HOSP PERFORMED
Amphetamines: NOT DETECTED
Barbiturates: NOT DETECTED
Benzodiazepines: NOT DETECTED
Cocaine: NOT DETECTED
Opiates: NOT DETECTED
Tetrahydrocannabinol: NOT DETECTED

## 2014-01-13 LAB — ETHANOL: Alcohol, Ethyl (B): 62 mg/dL — ABNORMAL HIGH (ref 0–11)

## 2014-01-13 MED ORDER — IBUPROFEN 600 MG PO TABS
600.0000 mg | ORAL_TABLET | Freq: Once | ORAL | Status: AC
  Administered 2014-01-14: 600 mg via ORAL
  Filled 2014-01-13: qty 1

## 2014-01-13 MED ORDER — AMITRIPTYLINE HCL 10 MG PO TABS
10.0000 mg | ORAL_TABLET | Freq: Every day | ORAL | Status: DC
  Administered 2014-01-14 – 2014-01-15 (×3): 10 mg via ORAL
  Filled 2014-01-13 (×5): qty 1
  Filled 2014-01-13: qty 14

## 2014-01-13 MED ORDER — ONDANSETRON 4 MG PO TBDP
4.0000 mg | ORAL_TABLET | Freq: Once | ORAL | Status: DC
  Administered 2014-01-13: 4 mg via ORAL
  Filled 2014-01-13: qty 1

## 2014-01-13 MED ORDER — LORAZEPAM 1 MG PO TABS
2.0000 mg | ORAL_TABLET | Freq: Once | ORAL | Status: AC
  Administered 2014-01-13: 2 mg via ORAL
  Filled 2014-01-13: qty 2

## 2014-01-13 MED ORDER — HYDROXYZINE HCL 25 MG PO TABS
25.0000 mg | ORAL_TABLET | Freq: Three times a day (TID) | ORAL | Status: DC | PRN
  Administered 2014-01-14 – 2014-01-15 (×5): 25 mg via ORAL
  Filled 2014-01-13 (×5): qty 1

## 2014-01-13 MED ORDER — CLONIDINE HCL 0.1 MG PO TABS
0.1000 mg | ORAL_TABLET | Freq: Once | ORAL | Status: AC
  Administered 2014-01-13: 0.1 mg via ORAL
  Filled 2014-01-13: qty 1

## 2014-01-13 MED ORDER — ALUM & MAG HYDROXIDE-SIMETH 200-200-20 MG/5ML PO SUSP
30.0000 mL | ORAL | Status: DC | PRN
  Administered 2014-01-14 (×2): 30 mL via ORAL

## 2014-01-13 MED ORDER — ONDANSETRON 4 MG PO TBDP
4.0000 mg | ORAL_TABLET | Freq: Once | ORAL | Status: AC
  Administered 2014-01-14: 4 mg via ORAL
  Filled 2014-01-13: qty 1

## 2014-01-13 MED ORDER — NICOTINE 21 MG/24HR TD PT24
21.0000 mg | MEDICATED_PATCH | Freq: Every day | TRANSDERMAL | Status: DC
  Administered 2014-01-13: 21 mg via TRANSDERMAL
  Filled 2014-01-13: qty 1

## 2014-01-13 MED ORDER — ALBUTEROL SULFATE HFA 108 (90 BASE) MCG/ACT IN AERS
2.0000 | INHALATION_SPRAY | Freq: Four times a day (QID) | RESPIRATORY_TRACT | Status: DC | PRN
  Administered 2014-01-14 – 2014-01-15 (×5): 2 via RESPIRATORY_TRACT
  Filled 2014-01-13: qty 6.7

## 2014-01-13 MED ORDER — ACETAMINOPHEN 325 MG PO TABS: 650.0000 mg | ORAL_TABLET | Freq: Four times a day (QID) | ORAL | Status: DC | PRN

## 2014-01-13 MED ORDER — THIAMINE HCL 100 MG/ML IJ SOLN
100.0000 mg | Freq: Every day | INTRAMUSCULAR | Status: DC
Start: 2014-01-13 — End: 2014-01-13

## 2014-01-13 MED ORDER — LORAZEPAM 1 MG PO TABS: 0.0000 mg | ORAL_TABLET | Freq: Four times a day (QID) | ORAL | Status: DC

## 2014-01-13 MED ORDER — VITAMIN B-1 100 MG PO TABS
100.0000 mg | ORAL_TABLET | Freq: Every day | ORAL | Status: DC
  Administered 2014-01-13: 100 mg via ORAL
  Filled 2014-01-13: qty 1

## 2014-01-13 MED ORDER — IBUPROFEN 200 MG PO TABS
600.0000 mg | ORAL_TABLET | Freq: Once | ORAL | Status: DC
  Administered 2014-01-13: 600 mg via ORAL
  Filled 2014-01-13: qty 3

## 2014-01-13 MED ORDER — VITAMIN B-1 100 MG PO TABS
100.0000 mg | ORAL_TABLET | Freq: Every day | ORAL | Status: DC
  Administered 2014-01-14 – 2014-01-16 (×3): 100 mg via ORAL
  Filled 2014-01-13 (×5): qty 1

## 2014-01-13 MED ORDER — MAGNESIUM HYDROXIDE 400 MG/5ML PO SUSP: 30.0000 mL | Freq: Every day | ORAL | Status: DC | PRN

## 2014-01-13 MED ORDER — ONDANSETRON HCL 4 MG PO TABS
4.0000 mg | ORAL_TABLET | Freq: Three times a day (TID) | ORAL | Status: DC | PRN
  Administered 2014-01-14: 4 mg via ORAL
  Filled 2014-01-13: qty 1

## 2014-01-13 MED ORDER — GABAPENTIN 800 MG PO TABS
800.0000 mg | ORAL_TABLET | Freq: Every day | ORAL | Status: DC
  Administered 2014-01-14: 800 mg via ORAL
  Filled 2014-01-13 (×2): qty 1

## 2014-01-13 MED ORDER — ONDANSETRON HCL 4 MG PO TABS: 4.0000 mg | ORAL_TABLET | Freq: Three times a day (TID) | ORAL | Status: DC | PRN

## 2014-01-13 MED ORDER — NICOTINE 21 MG/24HR TD PT24
21.0000 mg | MEDICATED_PATCH | Freq: Every day | TRANSDERMAL | Status: DC
  Administered 2014-01-14 – 2014-01-16 (×3): 21 mg via TRANSDERMAL
  Filled 2014-01-13 (×4): qty 1

## 2014-01-13 MED ORDER — LORAZEPAM 1 MG PO TABS
0.0000 mg | ORAL_TABLET | Freq: Four times a day (QID) | ORAL | Status: DC
  Administered 2014-01-14 (×2): 1 mg via ORAL
  Filled 2014-01-13 (×2): qty 1

## 2014-01-13 NOTE — Progress Notes (Signed)
Pt was lying in bed at the start of group. Writer encouraged pt to attend the evening AA speaker meeting. Pt stated she was not feeling well and remained in bed.
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
## 2014-01-13 NOTE — BH Assessment (Signed)
[facility] Assessment Progress Note  [facility] Nanine MeansJamison Lord, NP. [facility] and [facility]
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
## 2014-01-13 NOTE — ED Notes (Signed)
TSS consult NP at bedside.

## 2014-01-14 DIAGNOSIS — F329 Major depressive disorder, single episode, unspecified: Secondary | ICD-10-CM

## 2014-01-14 MED ORDER — PANTOPRAZOLE SODIUM 40 MG PO TBEC
40.0000 mg | DELAYED_RELEASE_TABLET | Freq: Every day | ORAL | Status: DC
  Administered 2014-01-14 – 2014-01-16 (×3): 40 mg via ORAL
  Filled 2014-01-14 (×5): qty 1

## 2014-01-14 MED ORDER — LISINOPRIL 20 MG PO TABS
20.0000 mg | ORAL_TABLET | Freq: Once | ORAL | Status: AC
  Administered 2014-01-14: 20 mg via ORAL
  Filled 2014-01-14 (×2): qty 1

## 2014-01-14 MED ORDER — CITALOPRAM HYDROBROMIDE 10 MG PO TABS
10.0000 mg | ORAL_TABLET | Freq: Every day | ORAL | Status: DC
  Administered 2014-01-14 – 2014-01-16 (×3): 10 mg via ORAL
  Filled 2014-01-14: qty 14
  Filled 2014-01-14 (×6): qty 1

## 2014-01-14 MED ORDER — ONDANSETRON 4 MG PO TBDP
4.0000 mg | ORAL_TABLET | Freq: Four times a day (QID) | ORAL | Status: DC | PRN
  Filled 2014-01-14: qty 1

## 2014-01-14 MED ORDER — LOPERAMIDE HCL 2 MG PO CAPS: 2.0000 mg | ORAL_CAPSULE | ORAL | Status: DC | PRN

## 2014-01-14 MED ORDER — CHLORDIAZEPOXIDE HCL 25 MG PO CAPS
25.0000 mg | ORAL_CAPSULE | Freq: Three times a day (TID) | ORAL | Status: AC
  Administered 2014-01-15 – 2014-01-16 (×3): 25 mg via ORAL
  Filled 2014-01-14 (×3): qty 1

## 2014-01-14 MED ORDER — CHLORDIAZEPOXIDE HCL 25 MG PO CAPS: 25.0000 mg | ORAL_CAPSULE | ORAL | Status: DC

## 2014-01-14 MED ORDER — THIAMINE HCL 100 MG/ML IJ SOLN
100.0000 mg | Freq: Once | INTRAMUSCULAR | Status: AC
  Administered 2014-01-14: 100 mg via INTRAMUSCULAR
  Filled 2014-01-14: qty 2

## 2014-01-14 MED ORDER — LISINOPRIL 10 MG PO TABS
10.0000 mg | ORAL_TABLET | Freq: Every day | ORAL | Status: DC
  Filled 2014-01-14: qty 2
  Filled 2014-01-14 (×3): qty 1

## 2014-01-14 MED ORDER — CHLORDIAZEPOXIDE HCL 25 MG PO CAPS: 25.0000 mg | ORAL_CAPSULE | Freq: Every day | ORAL | Status: DC

## 2014-01-14 MED ORDER — CHLORDIAZEPOXIDE HCL 25 MG PO CAPS
25.0000 mg | ORAL_CAPSULE | Freq: Four times a day (QID) | ORAL | Status: AC
  Administered 2014-01-14 – 2014-01-15 (×4): 25 mg via ORAL
  Filled 2014-01-14 (×4): qty 1

## 2014-01-14 MED ORDER — ADULT MULTIVITAMIN W/MINERALS CH
1.0000 | ORAL_TABLET | Freq: Every day | ORAL | Status: DC
  Administered 2014-01-14 – 2014-01-16 (×3): 1 via ORAL
  Filled 2014-01-14 (×5): qty 1

## 2014-01-14 MED ORDER — IBUPROFEN 600 MG PO TABS
600.0000 mg | ORAL_TABLET | Freq: Four times a day (QID) | ORAL | Status: DC | PRN
  Administered 2014-01-14 – 2014-01-16 (×5): 600 mg via ORAL
  Filled 2014-01-14 (×5): qty 1

## 2014-01-14 MED ORDER — CHLORDIAZEPOXIDE HCL 25 MG PO CAPS: 25.0000 mg | ORAL_CAPSULE | Freq: Four times a day (QID) | ORAL | Status: DC | PRN

## 2014-01-14 MED ORDER — GABAPENTIN 400 MG PO CAPS
800.0000 mg | ORAL_CAPSULE | Freq: Every day | ORAL | Status: DC
  Administered 2014-01-14 – 2014-01-15 (×2): 800 mg via ORAL
  Filled 2014-01-14 (×3): qty 2

## 2014-01-14 NOTE — Progress Notes (Signed)
Patient ID: Charlotte DuttonMelanie Fisher, female   DOB: 04/04/1966, 48 y.o.   MRN: [id number] D. Pt care resumed at 1100. Patient presents with anxious mood, affect congruent. Charlotte Fisher is very med seeking, and dictating of her care. She states to Clinical research associatewriter '' I need my vistaril increased because I was getting 50 mg three times a day and here I'm getting less. I also need robaxin for my lungs because I have pain from coughing. I want you to tell the doctor I need that. '' She is very somatic, then complaining of '' feeling nauseous, and tired, I can't stay up from the medications. I don't think I'll be able to go to lunch'' A. Discussed above and patients repeated requests for more anxiety and pain meds with A. Nwoko NP. R. Patient educated that md aware. Has been visible attending group and dozing, in no acute distress. Will continue to monitor q 15 minutes for safety.

## 2014-01-14 NOTE — [facility] Group Notes (Signed)
[facility] Group Notes: coping skills  Date:  01/14/2014  Time:  2:25 PM  Type of Therapy:  Nurse Education  Participation Level:  Active  Participation Quality:  Appropriate  Affect:  Appropriate  Cognitive:  Alert  Insight:  Appropriate  Engagement in Group:  Engaged  Modes of Intervention:  Discussion  Summary of Progress/Problems:  Charlotte Fisher 01/14/2014, 2:25 PM 

## 2014-01-14 NOTE — Progress Notes (Signed)
  D) Patient pleasant and cooperative upon my assessment. Patient c/o anxiety, PRN medications given with some decrease in anxiety.  Patient denies SI/HI, denies A/V hallucinations.   A) Patient offered support and encouragement, patient encouraged to discuss feelings/concerns with staff. Patient verbalized understanding. Patient monitored Q15 minutes for safety. Patient met with NP  to discuss today's goals and plan of care.  R) Patient visible in milieu, attending groups in day room and meals in dining room. Patient appropriate with staff and peers.   Patient taking medications as ordered. Patient insightful with a plan to "get better this time." Will continue to monitor.
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
## 2014-01-14 NOTE — Progress Notes (Signed)
Adult Psychoeducational Group Note  Date:  01/14/2014 Time:  10:38 AM  Group Topic/Focus:  Identifying Needs:   The focus of this group is to help patients identify their personal needs that have been historically problematic and identify healthy behaviors to address their needs.  Participation Level:  Minimal  Participation Quality:  Resistant  Affect:  Depressed and Flat  Cognitive:  Alert and Appropriate  Insight: Limited  Engagement in Group:  None  Modes of Intervention:  Activity, Clarification, Discussion, Education and Support  Additional Comments:  Pt was called out of the group to see the doctor.  When pt returned to the group, pt declined sharing about why she is here.  Staff explained the confidentiality policy and encouraged her to attend the groups and get the assistance she needs.  Pt stated she wanted to go [facility] but would not have the money until next month.  Pt received support from a female peer regarding her plan.  Gwyndolyn KaufmanGrace, Javed Cotto F 01/14/2014, 10:38 AM

## 2014-01-14 NOTE — Progress Notes (Signed)
[facility] Group Notes:  (Nursing/MHT/Case Management/Adjunct)  Date:  01/14/2014  Time:  6:41 PM  Type of Therapy:  Psychoeducational Skills  Participation Level:  Minimal  Participation Quality:  Inattentive and Redirectable  Affect:  Appropriate  Cognitive:  Appropriate  Insight:  Lacking  Engagement in Group:  Off Topic  Modes of Intervention:  Activity  Summary of Progress/Problems: Pts played a game of Pictionary using coping skills. Pt was in group for about 10minutes but would ask this writer questions that were off topic and did not pertain to group and then left to take a shower.  Caswell CorwinOwen, Karanveer Ramakrishnan C 01/14/2014, 6:41 PM

## 2014-01-14 NOTE — BHH Counselor (Signed)
Adult Psychosocial Assessment Update Interdisciplinary Team  Previous [facility] admissions/discharges:  Admissions Discharges  Date:  09/13/13 Date:  09/16/13  Date:  06/01/13 Date:  06/06/13  Date: Date:  Date: Date:  Date: Date:   Changes since the last Psychosocial Assessment (including adherence to outpatient mental health and/or substance abuse treatment, situational issues contributing to decompensation and/or relapse). Patient states that she relapsed the same day as her last discharge, and that since then she has been drinking "more than ever before."  She states that she did go to [facility], aided by the Transitional Care Team, but they "dropped the ball" and "became slack."  CSW explained that they were supposed to help her intensively as she got out of the hospital then back off over time, and that they will become more involved again now, in all likelihood.  She [place] living with [place] mother in [place], and all her money from her disability check went for rent and groceries, as well as bills and alcohol.  Most recently, [place]         Discharge Plan 1. Will you be returning to the same living situation after discharge?   Yes: No: XX     If no, what is your plan?    Her hope is to [facility] and get into drug court.  She has a court date [place] [address] [address] on 01/23/14, does not think it can be continued.  Is afraid she will not be [facility] as a result.  Once that is completed, she would like to [facility].  She is willing to go straight to [facility] if needed, but they have told her to bring $200 with her, and she cannot do that until she gets her next disability check.       2. Would you like a referral for services when you are discharged? Yes:  XX   If yes, for what services?  No:       She would like: 1 - Referral to [facility] 2 - Number for drug court 3 - Go to [facility] 4 -  [facility] back to [facility] 5 - Get Transitional Care Team services againa. 6 - Wants help with [facility] access.       Summary and Recommendations (to be completed by the evaluator) This is a 48yo Caucasian female who was hospitalized for detox in anticipation of going [facility].  She has been using alcohol, crack cocaine, heroin, and other opioids.  She has a court date on 01/23/14 and although she would [facility], is unsure as to whether she will be accepted with an open court date.  She would benefit from safety monitoring, medication evaluation, psychoeducation, group therapy, and discharge planning to link with ongoing resources.                        Signature:  Sarina SerGrossman-Orr, Makenzie Weisner Jo, 01/14/2014 9:38 AM
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
## 2014-01-14 NOTE — Progress Notes (Addendum)
Pt was given warm compresses for her back.2:45pm _pt given 600mg  of motrin for back pain a 10/10.

## 2014-01-14 NOTE — [facility] Suicide Risk Assessment (Signed)
   Nursing information obtained from:    Demographic factors:   48 year old caucasian female Current Mental Status:    Loss Factors:   poor level of functioning, legal issues, chronic alcoholism Historical Factors:   History of alcohol dependence Risk Reduction Factors:   Future oriented, optimistic about treatment plan Total Time spent with patient: 45 minutes  CLINICAL FACTORS:   Alcohol/Substance Abuse/Dependencies  Psychiatric Specialty Exam: Physical Exam  ROS  Blood pressure 115/83, pulse 100, temperature 98 F (36.7 C), temperature source Oral, resp. rate 20, height 5' (1.524 m), weight 76.658 kg (169 lb), last menstrual period 12/14/2013, SpO2 98.00%.Body mass index is 33.01 kg/(m^2).  General Appearance: Fairly Groomed  Patent attorneyye Contact::  Good  Speech:  Normal Rate  Volume:  Normal  Mood:  Depressed  Affect:  Constricted and mildly anxious  Thought Process:  Goal Directed and Linear  Orientation:  NA- fully alert and attentive, no delirium   Thought Content:  no hallucinations, no delusions  Suicidal Thoughts:  No- at this time denies any suicidal or homicidal ideations and contracts for safety on the unit  Homicidal Thoughts:  No  Memory:  NA  Judgement:  Fair  Insight:  Fair  Psychomotor Activity:  Normal  Concentration:  Good  Recall:  Good  Fund of Knowledge:Good  Language: Good  Akathisia:  No  Handed:  Right  AIMS (if indicated):     Assets:  Communication Skills Desire for Improvement  Sleep:  Number of Hours: 6   Musculoskeletal: Strength & Muscle Tone: within normal limits- no tremors or diaphoresis, no agitation at the present time Gait & Station: normal Patient leans: N/A  COGNITIVE FEATURES THAT CONTRIBUTE TO RISK:  Closed-mindedness    SUICIDE RISK:   Moderate:  Frequent suicidal ideation with limited intensity, and duration, some specificity in terms of plans, no associated intent, good self-control, limited dysphoria/symptomatology, some risk  factors present, and identifiable protective factors, including available and accessible social support.  PLAN OF CARE:Admit patient to inpatient unit for the purpose of detoxification/ management of withdrawal. Provide support and encouragement regarding sobriety/abstinence efforts. Will also monitor and manage mood disorder. Will  follow daily.   I certify that inpatient services furnished can reasonably be expected to improve the patient's condition.  COBOS, FERNANDO 01/14/2014, 12:28 PM

## 2014-01-14 NOTE — B[facility] Group Notes (Signed)
[facility] Group Notes:  (Clinical Social Work)  01/14/2014     10-11AM  Summary of Progress/Problems:   The main focus of today's process group was for the patient to identify ways in which they have in the past sabotaged their own recovery. Motivational Interviewing and a worksheet were utilized to help patients explore in depth the perceived benefits and costs of their substance use, as well as the potential benefits and costs of stopping.  The Stages of Change were explained using a handout, with an emphasis on making plans to deal with sabotaging behaviors proactively.  The patient expressed that their self-sabotaging behavior is hanging around friends that drink so that in turn she drinks.  She displayed little insight, was in and out of the room throughout group and was anxious about getting on the phone to her mother to ask for clothing, asking to leave group several times to do this.   Type of Therapy:  Group Therapy - Process   Participation Level:  Minimal  Participation Quality:  Attentive and Inattentive  Affect:  Depressed and Flat  Cognitive:  Disorganized  Insight:  Limited  Engagement in Therapy:  Limited  Modes of Intervention:  Education, Support and Processing, Motivational Interviewing  Ambrose MantleMareida Grossman-Orr, LCSW 01/14/2014, 11:17 AM

## 2014-01-15 NOTE — [facility] Group Notes (Signed)
[facility] Group Notes:  Goals group  Date:  01/15/2014  Time:  2:24 PM  Type of Therapy:  Nurse Education  Participation Level:  Active  Participation Quality:  Appropriate  Affect:  Appropriate  Cognitive:  Appropriate  Insight:  Appropriate  Engagement in Group:  Engaged  Modes of Intervention:  Discussion  Summary of Progress/Problems:Pt would like to know when she is leaving.  Rodman KeyWebb, Jatavian Calica [facility] 01/15/2014, 2:24 PM

## 2014-01-15 NOTE — [facility] Group Notes (Signed)
[facility] Group Notes:  Healthy support systems  Date:  01/15/2014  Time:  0900  Type of Therapy:  Nurse Education  Participation Level:  Active  Participation Quality:  Appropriate  Affect:  Appropriate  Cognitive:  Appropriate  Insight:  Appropriate  Engagement in Group:  Engaged  Modes of Intervention:  Discussion  Summary of Progress/Problems:pt stated she has 4 children ,11 grandchildren and plans to [facility] .  Rodman KeyWebb, Henritta Mutz [facility] 01/15/2014, 10:56 AM

## 2014-01-15 NOTE — Progress Notes (Signed)
Patient did attend the evening speaker AA meeting.  
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
## 2014-01-15 NOTE — Progress Notes (Signed)
D: Pt presents with blunted affect and sad, depressed mood.  Pt requested PRN medications for indigestion and pain.  Pt denies SI/HI.  Denies AH/VH.  Pt asking about what medications she has PRN and when she can get them.  Pt also expressed concern about a court date she has on Monday.  Pt interacting with peers appropriately.   A:  Medications administered per MD order. Support and encouragement provided.   R: PRN medications effective.  Will continue to monitor for safety.

## 2014-01-15 NOTE — Progress Notes (Signed)
D: Patient appears anxious with med seeking behavior.  She has asked for several prns, some which she has not been ordered.  Patient complaining about her medications, particularly the seroquel she is not getting.  She also wants her vistaril increased to 50 mg. She handed this nurse a medication list with the medication she wants highlighted.  Her main goal is to get into [facility] and from there go [facility].  During group, she has a tendency getting off topic and can be intrusive.  She denies any SI/HI/AVH.  She rates her depression as a 4 and denies any hopelessness.  She has also asked to be discharged today. A: Continue to monitor medication management and MD orders.  Safety checks completed every 15 minutes per protocol.  R: Patient intrusive and needy.

## 2014-01-15 NOTE — Progress Notes (Signed)
Adult Activity Group Note  Date: 01/15/14 Time: 3:00PM  Group Topic: Wellness Jeopardy  Participation Level: Active  Participation Quality: Appropriate  Affect: Appropriate  Activity: Patients participated in jeopardy like game, answering questions relating to wellness in categories such as Fruits, Fitness, Veggies, Exercise, and Producer, television/film/videoood Safety.  Additional Comments: Pt was coloring during group, however she did participated and tried to help her team members with the jeopardy questions.  Everrett Coombewen, Margaretha Mahan C 4:15 PM
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
## 2014-01-16 DIAGNOSIS — F410 Panic disorder [episodic paroxysmal anxiety] without agoraphobia: Secondary | ICD-10-CM | POA: Diagnosis present

## 2014-01-16 DIAGNOSIS — F4310 Post-traumatic stress disorder, unspecified: Secondary | ICD-10-CM

## 2014-01-16 DIAGNOSIS — F1110 Opioid abuse, uncomplicated: Secondary | ICD-10-CM

## 2014-01-16 DIAGNOSIS — F339 Major depressive disorder, recurrent, unspecified: Secondary | ICD-10-CM

## 2014-01-16 DIAGNOSIS — F1020 Alcohol dependence, uncomplicated: Principal | ICD-10-CM

## 2014-01-16 DIAGNOSIS — F333 Major depressive disorder, recurrent, severe with psychotic symptoms: Secondary | ICD-10-CM | POA: Diagnosis present

## 2014-01-16 MED ORDER — ACAMPROSATE CALCIUM 333 MG PO TBEC: 666.0000 mg | DELAYED_RELEASE_TABLET | Freq: Three times a day (TID) | ORAL | Status: DC

## 2014-01-16 MED ORDER — CITALOPRAM HYDROBROMIDE 10 MG PO TABS: 10.0000 mg | ORAL_TABLET | Freq: Every day | ORAL | Status: DC

## 2014-01-16 MED ORDER — AMITRIPTYLINE HCL 10 MG PO TABS: 10.0000 mg | ORAL_TABLET | Freq: Every day | ORAL | Status: DC

## 2014-01-16 MED ORDER — METHOCARBAMOL 500 MG PO TABS
500.0000 mg | ORAL_TABLET | Freq: Three times a day (TID) | ORAL | Status: DC
  Filled 2014-01-16 (×3): qty 1

## 2014-01-16 MED ORDER — ALBUTEROL SULFATE HFA 108 (90 BASE) MCG/ACT IN AERS: 2.0000 | INHALATION_SPRAY | Freq: Four times a day (QID) | RESPIRATORY_TRACT | Status: DC | PRN

## 2014-01-16 MED ORDER — LISINOPRIL 10 MG PO TABS: 10.0000 mg | ORAL_TABLET | Freq: Every day | ORAL | Status: DC

## 2014-01-16 MED ORDER — HYDROXYZINE HCL 25 MG PO TABS: 25.0000 mg | ORAL_TABLET | Freq: Three times a day (TID) | ORAL | Status: DC | PRN

## 2014-01-16 MED ORDER — PANTOPRAZOLE SODIUM 40 MG PO TBEC: 40.0000 mg | DELAYED_RELEASE_TABLET | Freq: Every day | ORAL | Status: DC

## 2014-01-16 MED ORDER — GABAPENTIN 400 MG PO CAPS: 800.0000 mg | ORAL_CAPSULE | Freq: Every day | ORAL | Status: DC

## 2014-01-16 MED ORDER — ACAMPROSATE CALCIUM 333 MG PO TBEC
666.0000 mg | DELAYED_RELEASE_TABLET | Freq: Three times a day (TID) | ORAL | Status: DC
  Filled 2014-01-16 (×5): qty 2

## 2014-01-16 MED ORDER — GABAPENTIN 800 MG PO TABS: 800.0000 mg | ORAL_TABLET | Freq: Every day | ORAL | Status: DC

## 2014-01-16 NOTE — Progress Notes (Signed)
D:  Pt presents with anxious affect and mood.  Pt intrusive at times, but redirectable.  Denies SI/HI.  Denies AH/VH.  Pt concerned about whether or not Robaxin was ordered.  Pt approached Clinical research associatewriter, asking "is there any way I can skip group tonight?  I feel achy and I've been going to groups all day and I just want to go lay down."  A: Writer requested for pt to attend evening group if at all possible and to stay focused on treatment.  Pt received PRN medication for pain and anxiety with positive effects.  Medications administered per order.  Safety maintained.  Support and encouragement provided.   R:  After group, pt stated "I stayed for the whole group.  It was actually really good."  Pt also reported that she plans to go home after discharge and then to [facility], depending on the outcome of her court hearing.  Pt is in no acute distress.  Will continue to monitor for safety.

## 2014-01-16 NOTE — Progress Notes (Signed)
Pt was discharged early this morning and left clothing behind. Pt signed an agreement that pt would come back in two hours to pick-up clothing. [facility]. Pt belongings were placed in the overflow of the search room to be held for 30 days.

## 2014-01-16 NOTE — B[facility] Group Notes (Signed)
[facility] LCSW Aftercare Discharge Planning Group Note   01/16/2014 8:45 AM  Participation Quality:  Alert, Appropriate and Oriented  Mood/Affect:  Anxious  Depression Rating:  0  Anxiety Rating:  0  Thoughts of Suicide:  Pt denies SI/HI  Will you contract for safety?   Yes  Current AVH:  Pt denies  Plan for Discharge/Comments:  Pt attended discharge planning group and actively participated in group.  CSW provided pt with today's workbook.  Pt immediately starting talking to CSW in the hallway about d/c this morning.  Pt states that she has court this morning and needs a court letter; CSW provided pt with a letter after group.  Pt states that [place] [facility] [place] and [facility] and [facility].  No further needs voiced by pt at this time.    Transportation Means: Pt reports access to transportation - friend will pick pt up  Supports: No supports mentioned at this time  Reyes IvanChelsea Horton, LCSW 01/16/2014 10:04 AM
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
## 2014-01-16 NOTE — Progress Notes (Signed)
[facility] Discharge [facility] :  Will you be returning to the same living situation after discharge: Yes,  [place] [facility] [place] At discharge, do you have transportation home?:Yes,  friend will pick pt up Do you have the ability to pay for your medications:Yes,  provided pt with samples and prescriptions and [facility] with [facility] med[facility]  Release of information consent forms completed and in the chart;  Patient's signature needed at discharge.  Patient to Follow up at: Follow-up Information   Follow up with [facility] On 01/17/2014. (Walk in on this date for hospital discharge appointment. Walk in clinic is Monday - Friday 8 am - 3 pm. They will than schedule you for medication management and therapy. )    Contact information:   [address] [place], [place] [address] Phone: [phone] Fax: [phone]      Patient denies SI/HI:   Yes,  denies SI/HI    Safety Planning and Suicide Prevention discussed:  Yes,  discussed with pt.  Pt refused consent to contact family/friend.  See suicide prevention education note.   Carmina MillerHorton, Adlean Hardeman Nicole 01/16/2014, 10:21 AM

## 2014-01-16 NOTE — Progress Notes (Signed)
Pt d/c from the hospital. Pt reports that she is in a hurry to leave for a court date. D/C instructions given and prescriptions given. Pt denies si and hi. Pt refused first dose of Campral and requested medication information. Medication information printed and given to pt. Pt refuses to wait on pharmacy for samples. Per Maryjo RochesterShalita RN, gave permission to pt to leave items in the nurses station and return to pick them up in two hours.
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
## 2014-01-16 NOTE — Consult Note (Signed)
Agree with plan 

## 2014-01-16 NOTE — [facility] Suicide Risk Assessment (Signed)
[facility] Family/Significant Other Suicide Prevention Education  Suicide Prevention Education:   Patient Refusal for Family/Significant Other Suicide Prevention Education: The patient has refused to provide written consent for family/significant other to be provided Family/Significant Other Suicide Prevention Education during admission and/or prior to discharge.  Physician notified.  CSW provided suicide prevention information with patient.    The suicide prevention education provided includes the following:  Suicide risk factors  Suicide prevention and interventions  National Suicide Hotline telephone number  [facility] telephone number  [facility] 911  [facility] and/or [facility] telephone number   Reyes IvanChelsea Horton, [place]CS[place] 01/16/2014 9:26 AM
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
## 2014-01-16 NOTE — Plan of Care (Signed)
Problem: Ineffective individual coping Goal: STG: Patient will remain free from self harm Outcome: Progressing Pt denies thoughts of self harm and said she would notify staff if thinking of harming self.   Problem: Alteration in mood Goal: LTG-Patient reports reduction in suicidal thoughts (Patient reports reduction in suicidal thoughts and is able to verbalize a safety plan for whenever patient is feeling suicidal)  Outcome: Progressing Pt denied SI this shift.   Goal: LTG-Pt's behavior demonstrates decreased signs of depression (Patient's behavior demonstrates decreased signs of depression to the point the patient is safe to return home and continue treatment in an outpatient setting)  Outcome: Progressing Pt attended evening group with encouragement from staff.   Goal: STG-Patient reports thoughts of self-harm to staff Outcome: Progressing Pt denies thoughts of self harm and said she would notify staff if thinking of harming self.   Problem: Alteration in mood & ability to function due to Goal: LTG-Pt reports reduction in suicidal thoughts (Patient reports reduction in suicidal thoughts and is able to verbalize a safety plan for whenever patient is feeling suicidal)  Outcome: Progressing Pt denied SI this shift.   Goal: LTG-Patient demonstrates decreased signs of withdrawal (Patient demonstrates decreased signs of withdrawal to the point the patient is safe to return home and continue treatment in an outpatient setting)  Outcome: Progressing Pt's CIWA scores 3 or less on 01/15/14. Goal: STG-Patient will attend groups Outcome: Progressing Pt attended evening group with staff's encouragement.
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
## 2014-01-20 NOTE — Progress Notes (Signed)
Patient Discharge Instructions:  After Visit Summary (AVS):   Faxed to:  01/20/14 Discharge Summary Note:   Faxed to:  01/20/14 Psychiatric Admission Assessment Note:   Faxed to:  01/20/14 Suicide Risk Assessment - Discharge Assessment:   Faxed to:  12/2413 Faxed/Sent to the Next Level Care provider:  01/20/14 [facility] [phone]  Jerelene ReddenSheena E Avon, 01/20/2014, 2:46 PM

## 2014-03-18 ENCOUNTER — Emergency Department (HOSPITAL_COMMUNITY)
Admission: EM | Admit: 2014-03-18 | Discharge: 2014-03-18 | Disposition: A | Discharge status: Home / Self Care | Payer: Medicaid Other | Attending: Emergency Medicine | Admitting: Emergency Medicine

## 2014-03-18 ENCOUNTER — Emergency Department (HOSPITAL_COMMUNITY): Payer: Medicaid Other

## 2014-03-18 ENCOUNTER — Encounter (HOSPITAL_COMMUNITY): Payer: Self-pay | Admitting: Emergency Medicine

## 2014-03-18 DIAGNOSIS — J449 Chronic obstructive pulmonary disease, unspecified: Secondary | ICD-10-CM | POA: Diagnosis not present

## 2014-03-18 DIAGNOSIS — X58XXXA Exposure to other specified factors, initial encounter: Secondary | ICD-10-CM | POA: Insufficient documentation

## 2014-03-18 DIAGNOSIS — Z79899 Other long term (current) drug therapy: Secondary | ICD-10-CM | POA: Diagnosis not present

## 2014-03-18 DIAGNOSIS — S59919A Unspecified injury of unspecified forearm, initial encounter: Secondary | ICD-10-CM

## 2014-03-18 DIAGNOSIS — S62329A Displaced fracture of shaft of unspecified metacarpal bone, initial encounter for closed fracture: Secondary | ICD-10-CM | POA: Insufficient documentation

## 2014-03-18 DIAGNOSIS — S62101A Fracture of unspecified carpal bone, right wrist, initial encounter for closed fracture: Secondary | ICD-10-CM

## 2014-03-18 DIAGNOSIS — S59909A Unspecified injury of unspecified elbow, initial encounter: Secondary | ICD-10-CM | POA: Diagnosis present

## 2014-03-18 DIAGNOSIS — F329 Major depressive disorder, single episode, unspecified: Secondary | ICD-10-CM | POA: Diagnosis not present

## 2014-03-18 DIAGNOSIS — Y9389 Activity, other specified: Secondary | ICD-10-CM | POA: Diagnosis not present

## 2014-03-18 DIAGNOSIS — J4489 Other specified chronic obstructive pulmonary disease: Secondary | ICD-10-CM | POA: Insufficient documentation

## 2014-03-18 DIAGNOSIS — Y929 Unspecified place or not applicable: Secondary | ICD-10-CM | POA: Diagnosis not present

## 2014-03-18 DIAGNOSIS — F3289 Other specified depressive episodes: Secondary | ICD-10-CM | POA: Diagnosis not present

## 2014-03-18 DIAGNOSIS — S6990XA Unspecified injury of unspecified wrist, hand and finger(s), initial encounter: Secondary | ICD-10-CM

## 2014-03-18 DIAGNOSIS — F411 Generalized anxiety disorder: Secondary | ICD-10-CM | POA: Diagnosis not present

## 2014-03-18 MED ORDER — OXYCODONE-ACETAMINOPHEN 5-325 MG PO TABS
2.0000 | ORAL_TABLET | Freq: Once | ORAL | Status: AC
  Administered 2014-03-18: 2 via ORAL
  Filled 2014-03-18: qty 2

## 2014-03-18 MED ORDER — FENTANYL CITRATE 0.05 MG/ML IJ SOLN
50.0000 ug | Freq: Once | INTRAMUSCULAR | Status: DC
  Filled 2014-03-18: qty 2

## 2014-03-18 MED ORDER — FENTANYL CITRATE 0.05 MG/ML IJ SOLN
100.0000 ug | Freq: Once | INTRAMUSCULAR | Status: AC
  Administered 2014-03-18: 100 ug via INTRAVENOUS
  Filled 2014-03-18: qty 2

## 2014-03-18 MED ORDER — HYDROCODONE-ACETAMINOPHEN 5-325 MG PO TABS: 1.0000 | ORAL_TABLET | ORAL | Status: DC | PRN

## 2014-03-18 MED ORDER — LORAZEPAM 2 MG/ML IJ SOLN
1.0000 mg | Freq: Once | INTRAMUSCULAR | Status: AC
  Administered 2014-03-18: 1 mg via INTRAVENOUS
  Filled 2014-03-18: qty 1

## 2014-03-18 MED ORDER — NAPROXEN 500 MG PO TABS: 500.0000 mg | ORAL_TABLET | Freq: Two times a day (BID) | ORAL | Status: DC

## 2014-03-18 MED ORDER — FENTANYL CITRATE 0.05 MG/ML IJ SOLN
100.0000 ug | Freq: Once | INTRAMUSCULAR | Status: AC
  Administered 2014-03-18: 100 ug via INTRAVENOUS
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
## 2014-03-18 NOTE — ED Notes (Signed)
Bed: WA17 Expected date:  Expected time:  Means of arrival:  Comments: Fall deformity

## 2014-03-18 NOTE — ED Notes (Signed)
Pt brother Orvilla Fus [phone], Pt mother [phone]

## 2014-03-18 NOTE — Discharge Instructions (Signed)

## 2014-03-18 NOTE — ED Notes (Signed)
Pt remains in WR, pt found to be unwrapping splint to R arm. Pt unwilling to listen to instruction regarding keeping ace wraps in place.  [employer] to transport pt.
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
## 2014-03-27 ENCOUNTER — Encounter (HOSPITAL_COMMUNITY): Payer: Self-pay | Admitting: Pharmacy Technician

## 2014-03-28 ENCOUNTER — Encounter (HOSPITAL_COMMUNITY): Payer: Self-pay | Admitting: *Deleted

## 2014-03-28 NOTE — Progress Notes (Signed)
Patient notified to call at 0800 for arrival time

## 2014-03-28 NOTE — Progress Notes (Addendum)
Ms Alto DenverHunt complains of severe pain in right arm.  Ms Alto DenverHunt reports that she took 7121- 200 mg Ibuprofen a day for 3 days, I stopped when I received the letter from the office that said to not take any.  "The Oxycodone was not helping and I was told to not take Tylenol because it could hurt my kidneys"  " I've taken that many in the past in a day."  Patient denies blood in stools or any sign of bleeding.  Patient states she has not used any illect drugs or drank any alcohol since she went through detox in July of this year.  Patient denies chest pain or any heart related disease.  Patient also reports that she stop taking Lisinopril, denies having elevated BP, "it was only high one time."  I notified ALlison Z. And sent a message to Dr Melvyn Novasrtmann.  Patient was admitted to the hospital with chest pain and abd pain, Nov 2014.  Ms Alto DenverHunt was seen by Dr Excell Seltzerooper  , she had elevated Tropin, normal Echo.  Patient was discharge with blood pressure medications and instructions to back off on Ibuprofen.  In August 2012 patient was advised to npt take any NSAIDS.

## 2014-03-28 NOTE — Progress Notes (Signed)
Ms Alto DenverHunt does not have a PCP.

## 2014-03-29 ENCOUNTER — Observation Stay (HOSPITAL_COMMUNITY)
Admission: RE | Admit: 2014-03-29 | Discharge: 2014-03-30 | Disposition: A | Discharge status: Home / Self Care | Payer: Medicaid Other | Source: Ambulatory Visit | Attending: Orthopedic Surgery | Admitting: Orthopedic Surgery

## 2014-03-29 ENCOUNTER — Encounter (HOSPITAL_COMMUNITY): Payer: Medicaid Other | Admitting: Certified Registered"

## 2014-03-29 ENCOUNTER — Encounter (HOSPITAL_COMMUNITY)
Admission: RE | Disposition: A | Discharge status: Home / Self Care | Payer: Self-pay | Source: Ambulatory Visit | Attending: Orthopedic Surgery

## 2014-03-29 ENCOUNTER — Ambulatory Visit (HOSPITAL_COMMUNITY): Payer: Medicaid Other | Admitting: Certified Registered"

## 2014-03-29 DIAGNOSIS — S52309A Unspecified fracture of shaft of unspecified radius, initial encounter for closed fracture: Secondary | ICD-10-CM | POA: Diagnosis present

## 2014-03-29 DIAGNOSIS — F1390 Sedative, hypnotic, or anxiolytic use, unspecified, uncomplicated: Secondary | ICD-10-CM | POA: Insufficient documentation

## 2014-03-29 DIAGNOSIS — F1190 Opioid use, unspecified, uncomplicated: Secondary | ICD-10-CM | POA: Insufficient documentation

## 2014-03-29 DIAGNOSIS — J449 Chronic obstructive pulmonary disease, unspecified: Secondary | ICD-10-CM | POA: Diagnosis not present

## 2014-03-29 DIAGNOSIS — F1490 Cocaine use, unspecified, uncomplicated: Secondary | ICD-10-CM | POA: Insufficient documentation

## 2014-03-29 DIAGNOSIS — J45909 Unspecified asthma, uncomplicated: Secondary | ICD-10-CM | POA: Insufficient documentation

## 2014-03-29 DIAGNOSIS — S52509A Unspecified fracture of the lower end of unspecified radius, initial encounter for closed fracture: Secondary | ICD-10-CM | POA: Diagnosis present

## 2014-03-29 DIAGNOSIS — W19XXXA Unspecified fall, initial encounter: Secondary | ICD-10-CM | POA: Diagnosis not present

## 2014-03-29 DIAGNOSIS — S52301A Unspecified fracture of shaft of right radius, initial encounter for closed fracture: Secondary | ICD-10-CM | POA: Diagnosis not present

## 2014-03-29 DIAGNOSIS — Z720 Tobacco use: Secondary | ICD-10-CM | POA: Diagnosis not present

## 2014-03-29 DIAGNOSIS — F17200 Nicotine dependence, unspecified, uncomplicated: Secondary | ICD-10-CM | POA: Diagnosis not present

## 2014-03-29 DIAGNOSIS — F1290 Cannabis use, unspecified, uncomplicated: Secondary | ICD-10-CM | POA: Insufficient documentation

## 2014-03-29 HISTORY — DX: Chronic kidney disease, unspecified: N18.9

## 2014-03-29 HISTORY — DX: Unspecified intracranial injury with loss of consciousness of unspecified duration, initial encounter: S06.9X9A

## 2014-03-29 HISTORY — DX: Unspecified injury of head, initial encounter: S09.90XA

## 2014-03-29 HISTORY — DX: Post-traumatic stress disorder, unspecified: F43.10

## 2014-03-29 HISTORY — PX: OPEN REDUCTION INTERNAL FIXATION (ORIF) DISTAL RADIAL FRACTURE: SHX5989

## 2014-03-29 HISTORY — DX: Cardiac arrhythmia, unspecified: I49.9

## 2014-03-29 LAB — COMPREHENSIVE METABOLIC PANEL
ALT: 28 U/L (ref 0–35)
AST: 32 U/L (ref 0–37)
Albumin: 3.5 g/dL (ref 3.5–5.2)
Alkaline Phosphatase: 85 U/L (ref 39–117)
Anion gap: 11 (ref 5–15)
BUN: 9 mg/dL (ref 6–23)
CALCIUM: 9.2 mg/dL (ref 8.4–10.5)
CO2: 26 meq/L (ref 19–32)
CREATININE: 0.91 mg/dL (ref 0.50–1.10)
Chloride: 101 mEq/L (ref 96–112)
GFR, EST AFRICAN AMERICAN: 85 mL/min — AB (ref >90)
GFR, EST NON AFRICAN AMERICAN: 73 mL/min — AB (ref >90)
GLUCOSE: 100 mg/dL — AB (ref 70–99)
Potassium: 4 mEq/L (ref 3.7–5.3)
Sodium: 138 mEq/L (ref 137–147)
Total Bilirubin: 0.3 mg/dL (ref 0.3–1.2)
Total Protein: 7.7 g/dL (ref 6.0–8.3)

## 2014-03-29 LAB — CBC
HCT: 40.4 % (ref 36.0–46.0)
Hemoglobin: 13.4 g/dL (ref 12.0–15.0)
MCH: 32.7 pg (ref 26.0–34.0)
MCHC: 33.2 g/dL (ref 30.0–36.0)
MCV: 98.5 fL (ref 78.0–100.0)
PLATELETS: 284 10*3/uL (ref 150–400)
RBC: 4.1 MIL/uL (ref 3.87–5.11)
RDW: 14 % (ref 11.5–15.5)
WBC: 8.8 10*3/uL (ref 4.0–10.5)

## 2014-03-29 LAB — HCG, SERUM, QUALITATIVE: PREG SERUM: NEGATIVE

## 2014-03-29 SURGERY — OPEN REDUCTION INTERNAL FIXATION (ORIF) DISTAL RADIUS FRACTURE
Anesthesia: General | Site: Arm Lower | Laterality: Right

## 2014-03-29 MED ORDER — MIDAZOLAM HCL 2 MG/2ML IJ SOLN
INTRAMUSCULAR | Status: AC
  Administered 2014-03-29: 2 mg via INTRAVENOUS
  Filled 2014-03-29: qty 2

## 2014-03-29 MED ORDER — ALPRAZOLAM 0.25 MG PO TABS
ORAL_TABLET | ORAL | Status: AC
  Filled 2014-03-29: qty 2

## 2014-03-29 MED ORDER — CEFAZOLIN SODIUM 1-5 GM-% IV SOLN: 1.0000 g | INTRAVENOUS | Status: DC

## 2014-03-29 MED ORDER — OXYCODONE HCL 5 MG PO TABS
5.0000 mg | ORAL_TABLET | ORAL | Status: DC | PRN
  Administered 2014-03-29 – 2014-03-30 (×3): 10 mg via ORAL
  Filled 2014-03-29 (×2): qty 2

## 2014-03-29 MED ORDER — CEFAZOLIN SODIUM 1-5 GM-% IV SOLN
1.0000 g | Freq: Three times a day (TID) | INTRAVENOUS | Status: DC
  Administered 2014-03-29 – 2014-03-30 (×2): 1 g via INTRAVENOUS
  Filled 2014-03-29 (×3): qty 50

## 2014-03-29 MED ORDER — CEFAZOLIN SODIUM-DEXTROSE 2-3 GM-% IV SOLR
INTRAVENOUS | Status: AC
Start: 2014-03-29 — End: 2014-03-30
  Filled 2014-03-29: qty 50

## 2014-03-29 MED ORDER — MIDAZOLAM HCL 2 MG/2ML IJ SOLN
INTRAMUSCULAR | Status: AC
  Filled 2014-03-29: qty 2

## 2014-03-29 MED ORDER — CHLORHEXIDINE GLUCONATE 4 % EX LIQD: 60.0000 mL | Freq: Once | CUTANEOUS | Status: DC

## 2014-03-29 MED ORDER — SUCCINYLCHOLINE CHLORIDE 20 MG/ML IJ SOLN
INTRAMUSCULAR | Status: DC | PRN
  Administered 2014-03-29: 100 mg via INTRAVENOUS

## 2014-03-29 MED ORDER — ALPRAZOLAM 0.5 MG PO TABS
0.5000 mg | ORAL_TABLET | Freq: Four times a day (QID) | ORAL | Status: DC | PRN
  Administered 2014-03-29: 0.5 mg via ORAL

## 2014-03-29 MED ORDER — PROMETHAZINE HCL 25 MG/ML IJ SOLN: 6.2500 mg | INTRAMUSCULAR | Status: DC | PRN

## 2014-03-29 MED ORDER — DIPHENHYDRAMINE HCL 25 MG PO CAPS
25.0000 mg | ORAL_CAPSULE | Freq: Four times a day (QID) | ORAL | Status: DC | PRN
  Administered 2014-03-29 – 2014-03-30 (×2): 50 mg via ORAL
  Filled 2014-03-29 (×2): qty 2

## 2014-03-29 MED ORDER — MIDAZOLAM HCL 2 MG/2ML IJ SOLN
INTRAMUSCULAR | Status: AC
Start: 2014-03-29 — End: 2014-03-30
  Filled 2014-03-29: qty 2

## 2014-03-29 MED ORDER — PROPOFOL 10 MG/ML IV BOLUS
INTRAVENOUS | Status: DC | PRN
  Administered 2014-03-29: 200 mg via INTRAVENOUS

## 2014-03-29 MED ORDER — CEFAZOLIN SODIUM-DEXTROSE 2-3 GM-% IV SOLR: 2.0000 g | INTRAVENOUS | Status: DC

## 2014-03-29 MED ORDER — LACTATED RINGERS IV SOLN
INTRAVENOUS | Status: DC
  Administered 2014-03-29: 15:00:00 via INTRAVENOUS

## 2014-03-29 MED ORDER — HYDROMORPHONE HCL 1 MG/ML IJ SOLN
INTRAMUSCULAR | Status: AC
  Administered 2014-03-29: 0.5 mg via INTRAVENOUS
  Filled 2014-03-29: qty 1

## 2014-03-29 MED ORDER — HYDROCODONE-ACETAMINOPHEN 5-325 MG PO TABS: 1.0000 | ORAL_TABLET | ORAL | Status: DC | PRN

## 2014-03-29 MED ORDER — BUPIVACAINE HCL (PF) 0.25 % IJ SOLN
INTRAMUSCULAR | Status: AC
  Filled 2014-03-29: qty 30

## 2014-03-29 MED ORDER — 0.9 % SODIUM CHLORIDE (POUR BTL) OPTIME
TOPICAL | Status: DC | PRN
  Administered 2014-03-29: 1000 mL

## 2014-03-29 MED ORDER — NICOTINE 14 MG/24HR TD PT24
14.0000 mg | MEDICATED_PATCH | Freq: Every day | TRANSDERMAL | Status: DC
  Administered 2014-03-29: 14 mg via TRANSDERMAL
  Filled 2014-03-29 (×2): qty 1

## 2014-03-29 MED ORDER — ALBUTEROL SULFATE (2.5 MG/3ML) 0.083% IN NEBU: 2.0000 mL | INHALATION_SOLUTION | Freq: Four times a day (QID) | RESPIRATORY_TRACT | Status: DC | PRN

## 2014-03-29 MED ORDER — OXYCODONE HCL 5 MG PO TABS
ORAL_TABLET | ORAL | Status: AC
  Administered 2014-03-29: 5 mg via ORAL
  Filled 2014-03-29: qty 1

## 2014-03-29 MED ORDER — MIDAZOLAM HCL 2 MG/2ML IJ SOLN
2.0000 mg | Freq: Once | INTRAMUSCULAR | Status: AC
  Administered 2014-03-29: 2 mg via INTRAVENOUS

## 2014-03-29 MED ORDER — OXYCODONE HCL 5 MG PO TABS: 5.0000 mg | ORAL_TABLET | ORAL | Status: DC | PRN

## 2014-03-29 MED ORDER — KCL IN DEXTROSE-NACL 20-5-0.45 MEQ/L-%-% IV SOLN
INTRAVENOUS | Status: DC
  Administered 2014-03-29: 21:00:00 via INTRAVENOUS
  Filled 2014-03-29 (×2): qty 1000

## 2014-03-29 MED ORDER — DOCUSATE SODIUM 100 MG PO CAPS
100.0000 mg | ORAL_CAPSULE | Freq: Two times a day (BID) | ORAL | Status: DC
  Filled 2014-03-29 (×2): qty 1

## 2014-03-29 MED ORDER — ONDANSETRON HCL 4 MG/2ML IJ SOLN
INTRAMUSCULAR | Status: DC | PRN
  Administered 2014-03-29: 4 mg via INTRAVENOUS

## 2014-03-29 MED ORDER — ONDANSETRON HCL 4 MG/2ML IJ SOLN: 4.0000 mg | Freq: Four times a day (QID) | INTRAMUSCULAR | Status: DC | PRN

## 2014-03-29 MED ORDER — HYDROMORPHONE HCL 1 MG/ML IJ SOLN
INTRAMUSCULAR | Status: AC
Start: 2014-03-29 — End: 2014-03-29
  Administered 2014-03-29: 0.5 mg via INTRAVENOUS
  Filled 2014-03-29: qty 1

## 2014-03-29 MED ORDER — FENTANYL CITRATE 0.05 MG/ML IJ SOLN
INTRAMUSCULAR | Status: DC | PRN
  Administered 2014-03-29 (×2): 50 ug via INTRAVENOUS
  Administered 2014-03-29: 100 ug via INTRAVENOUS
  Administered 2014-03-29: 50 ug via INTRAVENOUS

## 2014-03-29 MED ORDER — BUPIVACAINE-EPINEPHRINE (PF) 0.5% -1:200000 IJ SOLN
INTRAMUSCULAR | Status: DC | PRN
  Administered 2014-03-29: 30 mL via PERINEURAL

## 2014-03-29 MED ORDER — OXYCODONE HCL 5 MG PO TABS
5.0000 mg | ORAL_TABLET | Freq: Once | ORAL | Status: AC | PRN
  Administered 2014-03-29: 5 mg via ORAL

## 2014-03-29 MED ORDER — LACTATED RINGERS IV SOLN: INTRAVENOUS | Status: DC

## 2014-03-29 MED ORDER — ZOLPIDEM TARTRATE 5 MG PO TABS: 5.0000 mg | ORAL_TABLET | Freq: Every evening | ORAL | Status: DC | PRN

## 2014-03-29 MED ORDER — ALPRAZOLAM 0.5 MG PO TABS
1.0000 mg | ORAL_TABLET | ORAL | Status: DC | PRN
  Administered 2014-03-29 – 2014-03-30 (×3): 1 mg via ORAL
  Filled 2014-03-29 (×4): qty 2

## 2014-03-29 MED ORDER — METHOCARBAMOL 1000 MG/10ML IJ SOLN
500.0000 mg | Freq: Four times a day (QID) | INTRAVENOUS | Status: DC | PRN
  Filled 2014-03-29: qty 5

## 2014-03-29 MED ORDER — LACTATED RINGERS IV SOLN
INTRAVENOUS | Status: DC | PRN
  Administered 2014-03-29 (×2): via INTRAVENOUS

## 2014-03-29 MED ORDER — ONDANSETRON HCL 4 MG PO TABS: 4.0000 mg | ORAL_TABLET | Freq: Four times a day (QID) | ORAL | Status: DC | PRN

## 2014-03-29 MED ORDER — HYDROMORPHONE HCL 1 MG/ML IJ SOLN
0.5000 mg | INTRAMUSCULAR | Status: DC | PRN
  Administered 2014-03-29 – 2014-03-30 (×3): 1 mg via INTRAVENOUS
  Filled 2014-03-29 (×3): qty 1

## 2014-03-29 MED ORDER — HYDROMORPHONE HCL 1 MG/ML IJ SOLN
0.2500 mg | INTRAMUSCULAR | Status: DC | PRN
  Administered 2014-03-29 (×4): 0.5 mg via INTRAVENOUS

## 2014-03-29 MED ORDER — PROPOFOL 10 MG/ML IV BOLUS
INTRAVENOUS | Status: AC
  Filled 2014-03-29: qty 20

## 2014-03-29 MED ORDER — PANTOPRAZOLE SODIUM 40 MG PO TBEC: 40.0000 mg | DELAYED_RELEASE_TABLET | Freq: Every day | ORAL | Status: DC

## 2014-03-29 MED ORDER — ADULT MULTIVITAMIN W/MINERALS CH
1.0000 | ORAL_TABLET | Freq: Every day | ORAL | Status: DC
  Administered 2014-03-29: 1 via ORAL
  Filled 2014-03-29 (×2): qty 1

## 2014-03-29 MED ORDER — ONDANSETRON HCL 4 MG/2ML IJ SOLN
INTRAMUSCULAR | Status: AC
  Filled 2014-03-29: qty 2

## 2014-03-29 MED ORDER — MIDAZOLAM HCL 5 MG/5ML IJ SOLN
INTRAMUSCULAR | Status: DC | PRN
  Administered 2014-03-29 (×2): 2 mg via INTRAVENOUS

## 2014-03-29 MED ORDER — VITAMIN C 500 MG PO TABS
1000.0000 mg | ORAL_TABLET | Freq: Every day | ORAL | Status: DC
  Administered 2014-03-29: 1000 mg via ORAL
  Filled 2014-03-29 (×2): qty 2

## 2014-03-29 MED ORDER — FENTANYL CITRATE 0.05 MG/ML IJ SOLN
INTRAMUSCULAR | Status: AC
  Filled 2014-03-29: qty 5

## 2014-03-29 MED ORDER — METHOCARBAMOL 500 MG PO TABS
500.0000 mg | ORAL_TABLET | Freq: Four times a day (QID) | ORAL | Status: DC | PRN
  Administered 2014-03-29 – 2014-03-30 (×2): 500 mg via ORAL
  Filled 2014-03-29: qty 1

## 2014-03-29 MED ORDER — LISINOPRIL 10 MG PO TABS
10.0000 mg | ORAL_TABLET | Freq: Every day | ORAL | Status: DC
  Filled 2014-03-29 (×2): qty 1

## 2014-03-29 MED ORDER — OXYCODONE HCL 5 MG/5ML PO SOLN: 5.0000 mg | Freq: Once | ORAL | Status: AC | PRN

## 2014-03-29 MED ORDER — CEFAZOLIN SODIUM-DEXTROSE 2-3 GM-% IV SOLR
INTRAVENOUS | Status: AC
  Administered 2014-03-29: 2 g via INTRAVENOUS
  Filled 2014-03-29: qty 50

## 2014-03-29 SURGICAL SUPPLY — 69 items
BANDAGE ELASTIC 3 VELCRO ST LF (GAUZE/BANDAGES/DRESSINGS) ×3 IMPLANT
BANDAGE ELASTIC 4 VELCRO ST LF (GAUZE/BANDAGES/DRESSINGS) ×3 IMPLANT
BIT DRILL 2.2 SS TIBIAL (BIT) ×3 IMPLANT
BLADE SURG ROTATE 9660 (MISCELLANEOUS) IMPLANT
BNDG ESMARK 4X9 LF (GAUZE/BANDAGES/DRESSINGS) ×3 IMPLANT
BNDG GAUZE ELAST 4 BULKY (GAUZE/BANDAGES/DRESSINGS) ×3 IMPLANT
CANISTER SUCTION 2500CC (MISCELLANEOUS) ×3 IMPLANT
CLOSURE WOUND 1/2 X4 (GAUZE/BANDAGES/DRESSINGS)
CORDS BIPOLAR (ELECTRODE) ×3 IMPLANT
COVER SURGICAL LIGHT HANDLE (MISCELLANEOUS) ×3 IMPLANT
CUFF TOURNIQUET SINGLE 18IN (TOURNIQUET CUFF) ×3 IMPLANT
CUFF TOURNIQUET SINGLE 24IN (TOURNIQUET CUFF) IMPLANT
DRAIN TLS ROUND 10FR (DRAIN) IMPLANT
DRAPE OEC MINIVIEW 54X84 (DRAPES) ×3 IMPLANT
DRAPE SURG 17X11 SM STRL (DRAPES) ×3 IMPLANT
DRSG ADAPTIC 3X8 NADH LF (GAUZE/BANDAGES/DRESSINGS) ×3 IMPLANT
ELECT REM PT RETURN 9FT ADLT (ELECTROSURGICAL)
ELECTRODE REM PT RTRN 9FT ADLT (ELECTROSURGICAL) IMPLANT
GAUZE SPONGE 4X4 12PLY STRL (GAUZE/BANDAGES/DRESSINGS) ×3 IMPLANT
GAUZE SPONGE 4X4 16PLY XRAY LF (GAUZE/BANDAGES/DRESSINGS) ×3 IMPLANT
GLOVE BIOGEL PI IND STRL 8.5 (GLOVE) ×1 IMPLANT
GLOVE BIOGEL PI INDICATOR 8.5 (GLOVE) ×2
GLOVE SURG ORTHO 8.0 STRL STRW (GLOVE) ×3 IMPLANT
GOWN STRL REUS W/ TWL LRG LVL3 (GOWN DISPOSABLE) ×1 IMPLANT
GOWN STRL REUS W/ TWL XL LVL3 (GOWN DISPOSABLE) ×1 IMPLANT
GOWN STRL REUS W/TWL LRG LVL3 (GOWN DISPOSABLE) ×2
GOWN STRL REUS W/TWL XL LVL3 (GOWN DISPOSABLE) ×2
K-WIRE 1.6 (WIRE) ×2
K-WIRE FX5X1.6XNS BN SS (WIRE) ×1
KIT BASIN OR (CUSTOM PROCEDURE TRAY) ×3 IMPLANT
KIT ROOM TURNOVER OR (KITS) ×3 IMPLANT
KWIRE FX5X1.6XNS BN SS (WIRE) ×1 IMPLANT
MANIFOLD NEPTUNE II (INSTRUMENTS) IMPLANT
NEEDLE HYPO 25X1 1.5 SAFETY (NEEDLE) ×3 IMPLANT
NS IRRIG 1000ML POUR BTL (IV SOLUTION) ×3 IMPLANT
PACK ORTHO EXTREMITY (CUSTOM PROCEDURE TRAY) ×3 IMPLANT
PAD ARMBOARD 7.5X6 YLW CONV (MISCELLANEOUS) ×6 IMPLANT
PAD CAST 3X4 CTTN HI CHSV (CAST SUPPLIES) ×1 IMPLANT
PAD CAST 4YDX4 CTTN HI CHSV (CAST SUPPLIES) ×1 IMPLANT
PADDING CAST COTTON 3X4 STRL (CAST SUPPLIES) ×2
PADDING CAST COTTON 4X4 STRL (CAST SUPPLIES) ×2
PEG LOCKING SMOOTH 2.2X18 (Peg) ×3 IMPLANT
PEG LOCKING SMOOTH 2.2X20 (Screw) ×12 IMPLANT
PEG LOCKING SMOOTH 2.2X22 (Screw) ×3 IMPLANT
PLATE NARROW DVR RIGHT (Plate) ×3 IMPLANT
SCREW LOCK 14X2.7X 3 LD TPR (Screw) ×2 IMPLANT
SCREW LOCKING 2.7X13MM (Screw) ×3 IMPLANT
SCREW LOCKING 2.7X14 (Screw) ×4 IMPLANT
SOAP 2 % CHG 4 OZ (WOUND CARE) ×3 IMPLANT
SPLINT FIBERGLASS 3X35 (CAST SUPPLIES) ×3 IMPLANT
SPONGE LAP 4X18 X RAY DECT (DISPOSABLE) ×3 IMPLANT
STRIP CLOSURE SKIN 1/2X4 (GAUZE/BANDAGES/DRESSINGS) IMPLANT
SUT ETHILON 4 0 PS 2 18 (SUTURE) IMPLANT
SUT MNCRL AB 4-0 PS2 18 (SUTURE) IMPLANT
SUT PROLENE 4 0 PS 2 18 (SUTURE) ×3 IMPLANT
SUT VIC AB 2-0 CT1 27 (SUTURE) ×2
SUT VIC AB 2-0 CT1 TAPERPNT 27 (SUTURE) ×1 IMPLANT
SUT VIC AB 2-0 FS1 27 (SUTURE) IMPLANT
SUT VIC AB 4-0 PS2 27 (SUTURE) ×3 IMPLANT
SUT VICRYL 4-0 PS2 18IN ABS (SUTURE) IMPLANT
SUT VICRYL RAPIDE 4/0 PS 2 (SUTURE) ×6 IMPLANT
SYR CONTROL 10ML LL (SYRINGE) IMPLANT
SYSTEM CHEST DRAIN TLS 7FR (DRAIN) IMPLANT
TOWEL OR 17X24 6PK STRL BLUE (TOWEL DISPOSABLE) ×3 IMPLANT
TOWEL OR 17X26 10 PK STRL BLUE (TOWEL DISPOSABLE) ×3 IMPLANT
TUBE CONNECTING 12'X1/4 (SUCTIONS) ×1
TUBE CONNECTING 12X1/4 (SUCTIONS) ×2 IMPLANT
WATER STERILE IRR 1000ML POUR (IV SOLUTION) IMPLANT
YANKAUER SUCT BULB TIP NO VENT (SUCTIONS) IMPLANT

## 2014-03-29 NOTE — Progress Notes (Signed)
Patient has been off (has taken herself off of meds) Blood pressure meds for a month or so.  She denies any substance abuse--states last dose was in July 2015. Her mother, who came in w/c, was escorted to ER-- main c/o was constipation.   Her Clothes were taken to PACU by M. Lin GivensSavage, RN.    DA

## 2014-03-29 NOTE — Anesthesia Procedure Notes (Addendum)
Anesthesia Regional Block:  Supraclavicular block  Pre-Anesthetic Checklist: ,, timeout performed, Correct Patient, Correct Site, Correct Laterality, Correct Procedure, Correct Position, site marked, Risks and benefits discussed,  Surgical consent,  Pre-op evaluation,  At surgeon's request and post-op pain management  Laterality: Left  Prep: chloraprep       Needles:  Injection technique: Single-shot  Needle Type: Echogenic Stimulator Needle     Needle Length: 5cm 5 cm Needle Gauge: 22 and 22 G    Additional Needles:  Procedures: ultrasound guided (picture in chart) and nerve stimulator Supraclavicular block  Nerve Stimulator or Paresthesia:  Response: bicep contraction, 0.45 mA,   Additional Responses:   Narrative:  Start time: 03/29/2014 2:51 PM End time: 03/29/2014 3:01 PM Injection made incrementally with aspirations every 5 mL.  Performed by: Personally  Anesthesiologist: J. Adonis Hugueninan Singer, MD  Additional Notes: Functioning IV was confirmed and monitors applied.  A 50mm 22ga echogenic arrow stimulator was used. Sterile prep and drape,hand hygiene and sterile gloves were used.Ultrasound guidance: relevant anatomy identified, needle position confirmed, local anesthetic spread visualized around nerve(s)., vascular puncture avoided.  Image printed for medical record.  Negative aspiration and negative test dose prior to incremental administration of local anesthetic. The patient tolerated the procedure well.   Procedure Name: Intubation Date/Time: 03/29/2014 3:22 PM Performed by: Charm BargesBUTLER, Kaneshia Cater R Pre-anesthesia Checklist: Patient identified, Emergency Drugs available, Suction available, Patient being monitored and Timeout performed Patient Re-evaluated:Patient Re-evaluated prior to inductionOxygen Delivery Method: Circle system utilized Preoxygenation: Pre-oxygenation with 100% oxygen Intubation Type: IV induction Ventilation: Mask ventilation without difficulty Laryngoscope  Size: Mac and 3 Grade View: Grade I Tube type: Oral Tube size: 7.5 mm Number of attempts: 1 Airway Equipment and Method: Stylet Placement Confirmation: ETT inserted through vocal cords under direct vision and positive ETCO2 Secured at: 22 cm Tube secured with: Tape Dental Injury: Teeth and Oropharynx as per pre-operative assessment

## 2014-03-29 NOTE — Brief Op Note (Signed)
03/29/2014  2:47 PM  PATIENT:  Charlotte DuttonMelanie Fisher  48 y.o. female  PRE-OPERATIVE DIAGNOSIS:  RIGHT DISTAL RADIUS FRACTURE  POST-OPERATIVE DIAGNOSIS:  RIGHT DISTAL RADIUS FRACTURE  PROCEDURE:  Procedure(s): OPEN REDUCTION INTERNAL FIXATION (ORIF) DISTAL RADIUS   (Right)  SURGEON:  Surgeon(s) and Role:    * Sharma CovertFred W Annel Zunker, MD - Primary  PHYSICIAN ASSISTANT:   ASSISTANTS: none   ANESTHESIA:   general  EBL:     BLOOD ADMINISTERED:none  DRAINS: none   LOCAL MEDICATIONS USED:  MARCAINE     SPECIMEN:  No Specimen  DISPOSITION OF SPECIMEN:  N/A  COUNTS:  YES  TOURNIQUET:    DICTATION: .Other Dictation: Dictation Number see note  PLAN OF CARE: Discharge to home after PACU  PATIENT DISPOSITION:  PACU - hemodynamically stable.   Delay start of Pharmacological VTE agent (>24hrs) due to surgical blood loss or risk of bleeding: not applicable

## 2014-03-29 NOTE — Anesthesia Preprocedure Evaluation (Signed)
Anesthesia Evaluation  Patient identified by MRN, date of birth, ID band Patient awake    Reviewed: Allergy & Precautions, H&P , NPO status , Patient's Chart, lab work & pertinent test results  History of Anesthesia Complications (+) history of anesthetic complications  Airway Mallampati: II TM Distance: >3 FB Neck ROM: Full    Dental  (+) Dental Advisory Given, Poor Dentition   Pulmonary asthma , COPDCurrent Smoker,    Pulmonary exam normal       Cardiovascular negative cardio ROS      Neuro/Psych Anxiety Depression negative neurological ROS     GI/Hepatic negative GI ROS, Neg liver ROS,   Endo/Other  negative endocrine ROS  Renal/GU Renal InsufficiencyRenal disease  negative genitourinary   Musculoskeletal negative musculoskeletal ROS (+)   Abdominal   Peds negative pediatric ROS (+)  Hematology negative hematology ROS (+)   Anesthesia Other Findings   Reproductive/Obstetrics negative OB ROS                           Anesthesia Physical Anesthesia Plan  ASA: III  Anesthesia Plan: General   Post-op Pain Management:    Induction: Intravenous  Airway Management Planned: LMA  Additional Equipment:   Intra-op Plan:   Post-operative Plan: Extubation in OR  Informed Consent: I have reviewed the patients History and Physical, chart, labs and discussed the procedure including the risks, benefits and alternatives for the proposed anesthesia with the patient or authorized representative who has indicated his/her understanding and acceptance.   Dental advisory given  Plan Discussed with: CRNA, Anesthesiologist and Surgeon  Anesthesia Plan Comments:         Anesthesia Quick Evaluation

## 2014-03-29 NOTE — Transfer of Care (Signed)
Immediate Anesthesia Transfer of Care Note  Patient: Charlotte DuttonMelanie Fisher  Procedure(s) Performed: Procedure(s): OPEN REDUCTION INTERNAL FIXATION (ORIF) DISTAL RADIUS   (Right)  Patient Location: PACU  Anesthesia Type:General  Level of Consciousness: awake, alert , oriented and patient cooperative  Airway & Oxygen Therapy: Patient Spontanous Breathing and Patient connected to nasal cannula oxygen  Post-op Assessment: Report given to PACU RN, Post -op Vital signs reviewed and stable and Patient moving all extremities  Post vital signs: Reviewed and stable  Complications: No apparent anesthesia complications

## 2014-03-29 NOTE — Anesthesia Postprocedure Evaluation (Signed)
Anesthesia Post Note  Patient: Charlotte Fisher  Procedure(s) Performed: Procedure(s) (LRB): OPEN REDUCTION INTERNAL FIXATION (ORIF) DISTAL RADIUS   (Right)  Anesthesia type: general  Patient location: PACU  Post pain: Pain level controlled  Post assessment: Patient's Cardiovascular Status Stable  Last Vitals:  Filed Vitals:   03/29/14 1715  BP:   Pulse: 83  Temp:   Resp: 15    Post vital signs: Reviewed and stable  Level of consciousness: sedated  Complications: No apparent anesthesia complications

## 2014-03-29 NOTE — Discharge Instructions (Signed)
KEEP BANDAGE CLEAN AND DRY CALL OFFICE FOR F/U APPT [phone] in 15 days KEEP HAND ELEVATED ABOVE HEART OK TO APPLY ICE TO OPERATIVE AREA CONTACT OFFICE IF ANY WORSENING PAIN OR CONCERNS.  

## 2014-03-29 NOTE — Op Note (Signed)
PREOPERATIVE DIAGNOSIS: Right wrist intra-articular distal radius  fracture, 3 or more fragments.   POSTOPERATIVE DIAGNOSIS: Left wrist intra-articular distal radius  fracture, 3 or more fragments.   ATTENDING PHYSICIAN: Sharma Covert IV, MD who scrubbed and present  entire procedure.   ASSISTANT SURGEON: None.   ANESTHESIA: Supraclavicular block performed  and general  anesthesia.   SURGICAL IMPLANTS: DVR Crosslock plate, narrow with six distal locking pegs and 5 bicortical screws proximally  SURGICAL PROCEDURE:  1. Open treatment of right wrist intra-articular distal radius  fracture, 3 or more fragments.  2. Right  wrist brachioradialis tenotomy and release.  3. Radiographs, stress radiographs, right wrist.   SURGICAL INDICATIONS: Charlotte Fisher  is a right-hand-dominant female who sustained an intra-articular distal radius fracture after a fall. The  patient was seen and evaluated in the office  based on degree of  displacement and the  displacement, recommended that she undergo  the above procedure. Risks, benefits, and alternatives were discussed  in detail with the patient. Signed informed consent was obtained.  Risks include, but not limited to bleeding, infection, damage to nearby  nerves, arteries, or tendons, nonunion, malunion, hardware failure, loss  of motion of the elbow, wrist, and digits, and need for further surgical  intervention.   PROCEDURE: The patient was properly identified in the preop holding  area. A mark with a permanent marker was made on the left wrist to  indicate correct operative site. The patient tolerated the block  performed by Anesthesia. The patient was then brought back to the  operating room. The patient received preoperative antibiotics. General  anesthesia was induced. Right upper extremity was prepped and draped in  normal sterile fashion. Time-out was called. Correct site was  identified, and procedure then begun. Attention was then  turned to the  right wrist. The limb was then elevated using Esmarch exsanguination and  tourniquet insufflated. A longitudinal incision was made directly over  the FCR sheath. Dissection was then carried down through the skin and  subcutaneous tissue. The FCR sheath was then opened proximally and  distally. Careful dissection was done going through the floor of the  FCR sheath where the FPL was identified. An L-shaped pronator quadratus  flap was then elevated. In order to aid in reduction tenotomy of the brachioradialis was completed with protection of the first dorsal compartment tendons.  The fracture site was then opened and the patient did have several fracture fragment extending into the joint greater than 3 part intra-articular fracture. Careful open reduction was then carried out. The appropriate size dvr plate was chosen.. The oblong screw hole was then drilled with a 2.2 mm drill bit, then  bicortical screw placed. Plate height was adjusted.   After position was then confirmed using mini C-arm, the distal row  fixation was then carried out with the beginning from an ulnar to radial  direction with the  locking pegs. The total of 6 locking pegs were then placed. Following this, attention was then turned proximally where 2 more nonlocking screws and 2 locking screws, total of 5 screws in the shaft.   The wound was then thoroughly irrigated. Final stress radiography was then carried out.  Stress radiographs were then obtained under live fluoro showing no widening of the SL interval. I did not see any carpal dissociation with good fixation, without any  evidence of penetration in the articular margin with the locking pegs.    Postop, the pronator quadratus was then closed with 2-0  Vicryl.  Tourniquet was then deflated. Hemostasis was then obtained. The  subcutaneous tissues closed with 4-0 Vicryl and skin closed with 4.0 vicryl rapid. Adaptic dressing and sterile compressive bandage was   then applied. The patient was then placed in a well-padded sugartong splint  Extubated and taken to recovery room in good condition.    INTRAOPERATIVE RADIOGRAPHS:, 3 views of the wrist do show  the volar plate fixation in place. There is good position in both  planes.    POSTOPERATIVE PLAN: The patient will be admitted for IV antibiotics and  pain control; discharged in the morning. Seen back in the office for  approximately 10-14 days for wound check, suture removal, and then x-  rays, short-arm cast for total 4 weeks, and then begin a therapy regimen  around a 4-week mark. Radiographs at each visit.    Madelynn DoneFred W Kahne Helfand IV, MD
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
## 2014-03-29 NOTE — Progress Notes (Signed)
Pt continues to be anxious despite anxiety medicine given prior to arrival onto the unit. MD on call paged, [facility] whom ordered RN to change PRN xanax to 1 mg and change frequency to q4h. Pt also requested to receive a nicotine patch, PA also gave orders for a 14 mg nicotine patch. New orders have been carried out. Nursing will continue to monitor.

## 2014-03-29 NOTE — OR Nursing (Signed)
Pt. Continues to be very anxious and unable to get comfortable.  Consistently fidgeting, moving, disrupting medical devices, demanding to be repositioned, etc.Pain management and anxiety discussed with Dr. Krista BlueSinger when he visited patient at the bedside.  Versed and previously ordered Xanax will be used in addition to dilaudid for pain.

## 2014-03-29 NOTE — OR Nursing (Signed)
Pt arrived to pacu from the OR via stretcher. Monitor and oxygen attached.  Right arm elevated on 2 pillows.  Pt unable to move fingers on right hand independently due to nerve block.  Pt. argumentative and is very anxious. Verbal de-escalation is ineffective.

## 2014-03-29 NOTE — Progress Notes (Signed)
Orthopedic Tech Progress Note Patient Details:  Charlotte DuttonMelanie Dewalt 12/25/1965 [id number]  Ortho Devices Type of Ortho Device: Arm sling Ortho Device/Splint Location: RUE sling at bedside Ortho Device/Splint Interventions: Casandra DoffingOrdered   Zymeir Salminen Craig 03/29/2014, 10:29 PM

## 2014-03-30 ENCOUNTER — Encounter (HOSPITAL_COMMUNITY): Payer: Self-pay | Admitting: Orthopedic Surgery

## 2014-03-30 DIAGNOSIS — F1290 Cannabis use, unspecified, uncomplicated: Secondary | ICD-10-CM | POA: Diagnosis not present

## 2014-03-30 DIAGNOSIS — J449 Chronic obstructive pulmonary disease, unspecified: Secondary | ICD-10-CM | POA: Diagnosis not present

## 2014-03-30 DIAGNOSIS — W19XXXA Unspecified fall, initial encounter: Secondary | ICD-10-CM | POA: Diagnosis not present

## 2014-03-30 DIAGNOSIS — F1490 Cocaine use, unspecified, uncomplicated: Secondary | ICD-10-CM | POA: Diagnosis not present

## 2014-03-30 DIAGNOSIS — J45909 Unspecified asthma, uncomplicated: Secondary | ICD-10-CM | POA: Diagnosis not present

## 2014-03-30 DIAGNOSIS — S52301A Unspecified fracture of shaft of right radius, initial encounter for closed fracture: Secondary | ICD-10-CM | POA: Diagnosis not present

## 2014-03-30 DIAGNOSIS — F1390 Sedative, hypnotic, or anxiolytic use, unspecified, uncomplicated: Secondary | ICD-10-CM | POA: Diagnosis not present

## 2014-03-30 DIAGNOSIS — Z720 Tobacco use: Secondary | ICD-10-CM | POA: Diagnosis not present

## 2014-03-30 DIAGNOSIS — F1190 Opioid use, unspecified, uncomplicated: Secondary | ICD-10-CM | POA: Diagnosis not present

## 2014-03-30 NOTE — Progress Notes (Signed)
Utilization review completed.  
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
## 2014-03-30 NOTE — Progress Notes (Signed)
Pt is now resting soundly in bed with R wrist elevated on 3 pillows & pts bed alarm activated. Nursing will continue to monitor.

## 2014-03-30 NOTE — Progress Notes (Signed)
Pt is non-compliant with fall risk safety precautions with continual education regarding patient safety. Bed alarm placed once again. Pt also non-compliant with sling, elevation, and infection prevention of surgical site. Pt refuses to maintain elevation, and then continues to c/o swelling, pt re-educated. Pt found with ace wrap completely off pts R wrist. Pt educated and RN assisted pt to replace ace wrap over top splint and remaining dressings. Nursing will continue to monitor.

## 2014-05-03 ENCOUNTER — Emergency Department (HOSPITAL_COMMUNITY): Payer: Medicaid Other

## 2014-05-03 ENCOUNTER — Emergency Department (HOSPITAL_COMMUNITY)
Admission: EM | Admit: 2014-05-03 | Discharge: 2014-05-04 | Disposition: A | Discharge status: Other Acute Inpatient Hospital | Payer: Medicaid Other | Attending: Emergency Medicine | Admitting: Emergency Medicine

## 2014-05-03 ENCOUNTER — Encounter (HOSPITAL_COMMUNITY): Payer: Self-pay | Admitting: Emergency Medicine

## 2014-05-03 DIAGNOSIS — J449 Chronic obstructive pulmonary disease, unspecified: Secondary | ICD-10-CM | POA: Insufficient documentation

## 2014-05-03 DIAGNOSIS — Z79899 Other long term (current) drug therapy: Secondary | ICD-10-CM | POA: Insufficient documentation

## 2014-05-03 DIAGNOSIS — S0093XA Contusion of unspecified part of head, initial encounter: Secondary | ICD-10-CM | POA: Insufficient documentation

## 2014-05-03 DIAGNOSIS — Z791 Long term (current) use of non-steroidal anti-inflammatories (NSAID): Secondary | ICD-10-CM | POA: Insufficient documentation

## 2014-05-03 DIAGNOSIS — F419 Anxiety disorder, unspecified: Secondary | ICD-10-CM | POA: Diagnosis not present

## 2014-05-03 DIAGNOSIS — F1020 Alcohol dependence, uncomplicated: Secondary | ICD-10-CM | POA: Diagnosis not present

## 2014-05-03 DIAGNOSIS — F1910 Other psychoactive substance abuse, uncomplicated: Secondary | ICD-10-CM

## 2014-05-03 DIAGNOSIS — N189 Chronic kidney disease, unspecified: Secondary | ICD-10-CM | POA: Diagnosis not present

## 2014-05-03 DIAGNOSIS — Z3202 Encounter for pregnancy test, result negative: Secondary | ICD-10-CM | POA: Insufficient documentation

## 2014-05-03 DIAGNOSIS — F32A Depression, unspecified: Secondary | ICD-10-CM

## 2014-05-03 DIAGNOSIS — S060X0A Concussion without loss of consciousness, initial encounter: Secondary | ICD-10-CM | POA: Diagnosis not present

## 2014-05-03 DIAGNOSIS — F1410 Cocaine abuse, uncomplicated: Secondary | ICD-10-CM | POA: Diagnosis not present

## 2014-05-03 DIAGNOSIS — Z720 Tobacco use: Secondary | ICD-10-CM | POA: Diagnosis not present

## 2014-05-03 DIAGNOSIS — F329 Major depressive disorder, single episode, unspecified: Secondary | ICD-10-CM | POA: Diagnosis present

## 2014-05-03 DIAGNOSIS — T148XXA Other injury of unspecified body region, initial encounter: Secondary | ICD-10-CM

## 2014-05-03 DIAGNOSIS — S0990XA Unspecified injury of head, initial encounter: Secondary | ICD-10-CM | POA: Diagnosis present

## 2014-05-03 DIAGNOSIS — IMO0001 Reserved for inherently not codable concepts without codable children: Secondary | ICD-10-CM

## 2014-05-03 LAB — RAPID URINE DRUG SCREEN, HOSP PERFORMED
Amphetamines: NOT DETECTED
Barbiturates: NOT DETECTED
Benzodiazepines: NOT DETECTED
Cocaine: POSITIVE — AB
Opiates: NOT DETECTED
TETRAHYDROCANNABINOL: NOT DETECTED

## 2014-05-03 LAB — COMPREHENSIVE METABOLIC PANEL
ALBUMIN: 3.3 g/dL — AB (ref 3.5–5.2)
ALT: 30 U/L (ref 0–35)
AST: 35 U/L (ref 0–37)
Alkaline Phosphatase: 89 U/L (ref 39–117)
Anion gap: 17 — ABNORMAL HIGH (ref 5–15)
BUN: 8 mg/dL (ref 6–23)
CALCIUM: 9 mg/dL (ref 8.4–10.5)
CO2: 22 mEq/L (ref 19–32)
Chloride: 108 mEq/L (ref 96–112)
Creatinine, Ser: 1.1 mg/dL (ref 0.50–1.10)
GFR calc Af Amer: 68 mL/min — ABNORMAL LOW (ref >90)
GFR calc non Af Amer: 58 mL/min — ABNORMAL LOW (ref >90)
Glucose, Bld: 94 mg/dL (ref 70–99)
Potassium: 3.3 mEq/L — ABNORMAL LOW (ref 3.7–5.3)
SODIUM: 147 meq/L (ref 137–147)
TOTAL PROTEIN: 7 g/dL (ref 6.0–8.3)
Total Bilirubin: 0.2 mg/dL — ABNORMAL LOW (ref 0.3–1.2)

## 2014-05-03 LAB — CBC
HCT: 40.9 % (ref 36.0–46.0)
HEMOGLOBIN: 13.8 g/dL (ref 12.0–15.0)
MCH: 32.2 pg (ref 26.0–34.0)
MCHC: 33.7 g/dL (ref 30.0–36.0)
MCV: 95.3 fL (ref 78.0–100.0)
Platelets: 256 10*3/uL (ref 150–400)
RBC: 4.29 MIL/uL (ref 3.87–5.11)
RDW: 13.5 % (ref 11.5–15.5)
WBC: 11.4 10*3/uL — ABNORMAL HIGH (ref 4.0–10.5)

## 2014-05-03 LAB — URINALYSIS, ROUTINE W REFLEX MICROSCOPIC
BILIRUBIN URINE: NEGATIVE
GLUCOSE, UA: NEGATIVE mg/dL
KETONES UR: NEGATIVE mg/dL
Leukocytes, UA: NEGATIVE
Nitrite: NEGATIVE
Protein, ur: NEGATIVE mg/dL
Specific Gravity, Urine: 1.005 (ref 1.005–1.030)
Urobilinogen, UA: 0.2 mg/dL (ref 0.0–1.0)
pH: 6 (ref 5.0–8.0)

## 2014-05-03 LAB — URINE MICROSCOPIC-ADD ON

## 2014-05-03 LAB — ETHANOL: ALCOHOL ETHYL (B): 128 mg/dL — AB (ref 0–11)

## 2014-05-03 LAB — SALICYLATE LEVEL: Salicylate Lvl: <2 mg/dL — ABNORMAL LOW (ref 2.8–20.0)

## 2014-05-03 LAB — PREGNANCY, URINE: Preg Test, Ur: NEGATIVE

## 2014-05-03 LAB — ACETAMINOPHEN LEVEL: Acetaminophen (Tylenol), Serum: <15 ug/mL (ref 10–30)

## 2014-05-03 MED ORDER — LORAZEPAM 1 MG PO TABS: 0.0000 mg | ORAL_TABLET | Freq: Two times a day (BID) | ORAL | Status: DC

## 2014-05-03 MED ORDER — ONDANSETRON HCL 4 MG PO TABS: 4.0000 mg | ORAL_TABLET | Freq: Three times a day (TID) | ORAL | Status: DC | PRN

## 2014-05-03 MED ORDER — ALPRAZOLAM 0.5 MG PO TABS
1.0000 mg | ORAL_TABLET | Freq: Two times a day (BID) | ORAL | Status: DC | PRN
  Administered 2014-05-03: 1 mg via ORAL
  Filled 2014-05-03: qty 2

## 2014-05-03 MED ORDER — IBUPROFEN 200 MG PO TABS
600.0000 mg | ORAL_TABLET | Freq: Once | ORAL | Status: DC
Start: 2014-05-03 — End: 2014-05-04

## 2014-05-03 MED ORDER — IBUPROFEN 200 MG PO TABS: 600.0000 mg | ORAL_TABLET | Freq: Three times a day (TID) | ORAL | Status: DC | PRN

## 2014-05-03 MED ORDER — ALBUTEROL SULFATE HFA 108 (90 BASE) MCG/ACT IN AERS
2.0000 | INHALATION_SPRAY | Freq: Four times a day (QID) | RESPIRATORY_TRACT | Status: DC | PRN
  Administered 2014-05-03 – 2014-05-04 (×2): 2 via RESPIRATORY_TRACT
  Filled 2014-05-03: qty 6.7

## 2014-05-03 MED ORDER — VITAMIN B-1 100 MG PO TABS
100.0000 mg | ORAL_TABLET | Freq: Every day | ORAL | Status: DC
  Administered 2014-05-03 – 2014-05-04 (×2): 100 mg via ORAL
  Filled 2014-05-03 (×2): qty 1

## 2014-05-03 MED ORDER — AMITRIPTYLINE HCL 10 MG PO TABS
10.0000 mg | ORAL_TABLET | Freq: Every day | ORAL | Status: DC
  Administered 2014-05-03: 10 mg via ORAL
  Filled 2014-05-03 (×3): qty 1

## 2014-05-03 MED ORDER — LORAZEPAM 1 MG PO TABS
1.0000 mg | ORAL_TABLET | Freq: Three times a day (TID) | ORAL | Status: DC | PRN
  Administered 2014-05-04: 1 mg via ORAL
  Filled 2014-05-03 (×2): qty 1

## 2014-05-03 MED ORDER — GABAPENTIN 400 MG PO CAPS
800.0000 mg | ORAL_CAPSULE | Freq: Every day | ORAL | Status: DC
  Administered 2014-05-03: 800 mg via ORAL
  Filled 2014-05-03: qty 2

## 2014-05-03 MED ORDER — HYDROCODONE-ACETAMINOPHEN 5-325 MG PO TABS: 1.0000 | ORAL_TABLET | Freq: Once | ORAL | Status: DC

## 2014-05-03 MED ORDER — THIAMINE HCL 100 MG/ML IJ SOLN: 100.0000 mg | Freq: Every day | INTRAMUSCULAR | Status: DC

## 2014-05-03 MED ORDER — PANTOPRAZOLE SODIUM 40 MG PO TBEC
40.0000 mg | DELAYED_RELEASE_TABLET | Freq: Every day | ORAL | Status: DC
  Administered 2014-05-03 – 2014-05-04 (×2): 40 mg via ORAL
  Filled 2014-05-03 (×2): qty 1

## 2014-05-03 MED ORDER — NAPROXEN 500 MG PO TABS
500.0000 mg | ORAL_TABLET | Freq: Two times a day (BID) | ORAL | Status: DC
  Administered 2014-05-03 – 2014-05-04 (×3): 500 mg via ORAL
  Filled 2014-05-03 (×3): qty 1

## 2014-05-03 MED ORDER — LORAZEPAM 1 MG PO TABS
0.0000 mg | ORAL_TABLET | Freq: Four times a day (QID) | ORAL | Status: DC
  Administered 2014-05-03 (×2): 1 mg via ORAL
  Filled 2014-05-03: qty 2

## 2014-05-03 MED ORDER — CITALOPRAM HYDROBROMIDE 10 MG PO TABS
10.0000 mg | ORAL_TABLET | Freq: Every day | ORAL | Status: DC
  Administered 2014-05-03 – 2014-05-04 (×2): 10 mg via ORAL
  Filled 2014-05-03 (×2): qty 1

## 2014-05-03 MED ORDER — NICOTINE 21 MG/24HR TD PT24
21.0000 mg | MEDICATED_PATCH | Freq: Every day | TRANSDERMAL | Status: DC
  Administered 2014-05-03 – 2014-05-04 (×2): 21 mg via TRANSDERMAL
  Filled 2014-05-03 (×2): qty 1

## 2014-05-03 MED ORDER — OXYCODONE HCL 5 MG PO TABS
5.0000 mg | ORAL_TABLET | ORAL | Status: DC | PRN
  Administered 2014-05-03: 5 mg via ORAL
  Filled 2014-05-03: qty 1

## 2014-05-03 NOTE — ED Notes (Signed)
Patient transported to X-ray 

## 2014-05-03 NOTE — ED Notes (Signed)
Pt does not have to void at this time.
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
## 2014-05-03 NOTE — ED Notes (Signed)
Bed: WA17 Expected date: 05/03/14 Expected time: 1:35 AM Means of arrival: Ambulance Comments: 48 yo F  Fall/ETOH
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
## 2014-05-03 NOTE — ED Notes (Signed)
Pt given peanut butter, graham crackers and sprite 

## 2014-05-04 ENCOUNTER — Encounter (HOSPITAL_COMMUNITY): Payer: Self-pay | Admitting: *Deleted

## 2014-05-04 ENCOUNTER — Observation Stay (HOSPITAL_COMMUNITY)
Admission: AD | Admit: 2014-05-04 | Discharge: 2014-05-05 | Disposition: A | Discharge status: Home / Self Care | Payer: Medicaid Other | Source: Intra-hospital | Attending: Psychiatry | Admitting: Psychiatry

## 2014-05-04 DIAGNOSIS — F332 Major depressive disorder, recurrent severe without psychotic features: Secondary | ICD-10-CM | POA: Insufficient documentation

## 2014-05-04 DIAGNOSIS — F1994 Other psychoactive substance use, unspecified with psychoactive substance-induced mood disorder: Secondary | ICD-10-CM

## 2014-05-04 DIAGNOSIS — J449 Chronic obstructive pulmonary disease, unspecified: Secondary | ICD-10-CM | POA: Diagnosis not present

## 2014-05-04 DIAGNOSIS — F1114 Opioid abuse with opioid-induced mood disorder: Secondary | ICD-10-CM | POA: Diagnosis not present

## 2014-05-04 DIAGNOSIS — F1414 Cocaine abuse with cocaine-induced mood disorder: Principal | ICD-10-CM | POA: Insufficient documentation

## 2014-05-04 DIAGNOSIS — F1014 Alcohol abuse with alcohol-induced mood disorder: Secondary | ICD-10-CM | POA: Diagnosis not present

## 2014-05-04 DIAGNOSIS — F329 Major depressive disorder, single episode, unspecified: Secondary | ICD-10-CM

## 2014-05-04 DIAGNOSIS — F12188 Cannabis abuse with other cannabis-induced disorder: Secondary | ICD-10-CM | POA: Insufficient documentation

## 2014-05-04 DIAGNOSIS — F1910 Other psychoactive substance abuse, uncomplicated: Secondary | ICD-10-CM | POA: Diagnosis present

## 2014-05-04 DIAGNOSIS — F419 Anxiety disorder, unspecified: Secondary | ICD-10-CM | POA: Diagnosis present

## 2014-05-04 DIAGNOSIS — J45909 Unspecified asthma, uncomplicated: Secondary | ICD-10-CM | POA: Diagnosis not present

## 2014-05-04 DIAGNOSIS — F1314 Sedative, hypnotic or anxiolytic abuse with sedative, hypnotic or anxiolytic-induced mood disorder: Secondary | ICD-10-CM | POA: Insufficient documentation

## 2014-05-04 MED ORDER — HYDROXYZINE HCL 50 MG/ML IM SOLN: 50.0000 mg | Freq: Three times a day (TID) | INTRAMUSCULAR | Status: DC | PRN

## 2014-05-04 MED ORDER — NAPROXEN 500 MG PO TABS
500.0000 mg | ORAL_TABLET | Freq: Two times a day (BID) | ORAL | Status: DC
  Administered 2014-05-05: 500 mg via ORAL
  Filled 2014-05-04 (×6): qty 1

## 2014-05-04 MED ORDER — NICOTINE 21 MG/24HR TD PT24
21.0000 mg | MEDICATED_PATCH | Freq: Every day | TRANSDERMAL | Status: DC
  Administered 2014-05-05: 21 mg via TRANSDERMAL
  Filled 2014-05-04 (×3): qty 1

## 2014-05-04 MED ORDER — PANTOPRAZOLE SODIUM 40 MG PO TBEC
40.0000 mg | DELAYED_RELEASE_TABLET | Freq: Every day | ORAL | Status: DC
  Administered 2014-05-05: 40 mg via ORAL
  Filled 2014-05-04 (×3): qty 1

## 2014-05-04 MED ORDER — LOPERAMIDE HCL 2 MG PO CAPS: 2.0000 mg | ORAL_CAPSULE | ORAL | Status: DC | PRN

## 2014-05-04 MED ORDER — VITAMIN B-1 100 MG PO TABS
100.0000 mg | ORAL_TABLET | Freq: Every day | ORAL | Status: DC
  Administered 2014-05-05: 100 mg via ORAL
  Filled 2014-05-04 (×3): qty 1

## 2014-05-04 MED ORDER — ACETAMINOPHEN 325 MG PO TABS: 650.0000 mg | ORAL_TABLET | Freq: Four times a day (QID) | ORAL | Status: DC | PRN

## 2014-05-04 MED ORDER — IBUPROFEN 600 MG PO TABS: 600.0000 mg | ORAL_TABLET | Freq: Three times a day (TID) | ORAL | Status: DC | PRN

## 2014-05-04 MED ORDER — CHLORDIAZEPOXIDE HCL 25 MG PO CAPS
25.0000 mg | ORAL_CAPSULE | Freq: Four times a day (QID) | ORAL | Status: DC
  Administered 2014-05-04 – 2014-05-05 (×2): 25 mg via ORAL
  Filled 2014-05-04 (×3): qty 1

## 2014-05-04 MED ORDER — THIAMINE HCL 100 MG/ML IJ SOLN: 100.0000 mg | Freq: Every day | INTRAMUSCULAR | Status: DC

## 2014-05-04 MED ORDER — MAGNESIUM HYDROXIDE 400 MG/5ML PO SUSP: 30.0000 mL | Freq: Every day | ORAL | Status: DC | PRN

## 2014-05-04 MED ORDER — CHLORDIAZEPOXIDE HCL 25 MG PO CAPS: 25.0000 mg | ORAL_CAPSULE | ORAL | Status: DC

## 2014-05-04 MED ORDER — AMITRIPTYLINE HCL 10 MG PO TABS
10.0000 mg | ORAL_TABLET | Freq: Every day | ORAL | Status: DC
  Administered 2014-05-04: 10 mg via ORAL
  Filled 2014-05-04 (×3): qty 1

## 2014-05-04 MED ORDER — CHLORDIAZEPOXIDE HCL 25 MG PO CAPS: 25.0000 mg | ORAL_CAPSULE | Freq: Every day | ORAL | Status: DC

## 2014-05-04 MED ORDER — METHOCARBAMOL 500 MG PO TABS
500.0000 mg | ORAL_TABLET | Freq: Three times a day (TID) | ORAL | Status: DC | PRN
  Administered 2014-05-04: 500 mg via ORAL
  Filled 2014-05-04: qty 1

## 2014-05-04 MED ORDER — CITALOPRAM HYDROBROMIDE 10 MG PO TABS
10.0000 mg | ORAL_TABLET | Freq: Every day | ORAL | Status: DC
  Administered 2014-05-05: 10 mg via ORAL
  Filled 2014-05-04 (×3): qty 1

## 2014-05-04 MED ORDER — LISINOPRIL 10 MG PO TABS: 10.0000 mg | ORAL_TABLET | Freq: Every day | ORAL | Status: DC

## 2014-05-04 MED ORDER — CHLORDIAZEPOXIDE HCL 25 MG PO CAPS: 25.0000 mg | ORAL_CAPSULE | Freq: Four times a day (QID) | ORAL | Status: DC | PRN

## 2014-05-04 MED ORDER — CHLORDIAZEPOXIDE HCL 25 MG PO CAPS
25.0000 mg | ORAL_CAPSULE | Freq: Three times a day (TID) | ORAL | Status: DC
Start: 2014-05-05 — End: 2014-05-04

## 2014-05-04 MED ORDER — HYDROXYZINE HCL 50 MG PO TABS
50.0000 mg | ORAL_TABLET | Freq: Three times a day (TID) | ORAL | Status: DC | PRN
  Administered 2014-05-05: 50 mg via ORAL
  Filled 2014-05-04: qty 1

## 2014-05-04 MED ORDER — METHOCARBAMOL 500 MG PO TABS
500.0000 mg | ORAL_TABLET | Freq: Three times a day (TID) | ORAL | Status: DC | PRN
  Administered 2014-05-05: 500 mg via ORAL
  Filled 2014-05-04: qty 1

## 2014-05-04 MED ORDER — ALUM & MAG HYDROXIDE-SIMETH 200-200-20 MG/5ML PO SUSP: 30.0000 mL | ORAL | Status: DC | PRN

## 2014-05-04 MED ORDER — CHLORDIAZEPOXIDE HCL 25 MG PO CAPS
25.0000 mg | ORAL_CAPSULE | Freq: Four times a day (QID) | ORAL | Status: DC
  Administered 2014-05-04: 25 mg via ORAL
  Filled 2014-05-04: qty 1

## 2014-05-04 MED ORDER — GABAPENTIN 400 MG PO CAPS
800.0000 mg | ORAL_CAPSULE | Freq: Every day | ORAL | Status: DC
  Administered 2014-05-04: 800 mg via ORAL
  Filled 2014-05-04 (×3): qty 2

## 2014-05-04 MED ORDER — ALBUTEROL SULFATE HFA 108 (90 BASE) MCG/ACT IN AERS
2.0000 | INHALATION_SPRAY | Freq: Four times a day (QID) | RESPIRATORY_TRACT | Status: DC | PRN
  Administered 2014-05-04: 2 via RESPIRATORY_TRACT
  Filled 2014-05-04: qty 6.7

## 2014-05-04 MED ORDER — HYDROXYZINE HCL 25 MG PO TABS
50.0000 mg | ORAL_TABLET | Freq: Three times a day (TID) | ORAL | Status: DC | PRN
  Administered 2014-05-04: 50 mg via ORAL
  Filled 2014-05-04: qty 2

## 2014-05-04 MED ORDER — LISINOPRIL 10 MG PO TABS
10.0000 mg | ORAL_TABLET | Freq: Every day | ORAL | Status: DC
  Administered 2014-05-05: 10 mg via ORAL
  Filled 2014-05-04 (×3): qty 1

## 2014-05-04 MED ORDER — CHLORDIAZEPOXIDE HCL 25 MG PO CAPS: 25.0000 mg | ORAL_CAPSULE | Freq: Three times a day (TID) | ORAL | Status: DC

## 2014-05-04 MED ORDER — ONDANSETRON HCL 4 MG PO TABS: 4.0000 mg | ORAL_TABLET | Freq: Three times a day (TID) | ORAL | Status: DC | PRN
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
## 2014-05-04 NOTE — Plan of Care (Signed)
[facility] Observation Crisis Plan  Reason for Crisis Plan:  Crisis Stabilization and Substance Abuse   Plan of Care:  Referral for Substance Abuse  Family Support:      Current Living Environment:  Living Arrangements: Spouse/significant other  Insurance:   Hospital Account    Name Acct ID Class Status Primary Coverage   Charlotte Fisher, Charlotte Fisher [id number] BEHAVIORAL HEALTH OBSERVATION [place]        Guarantor Account (for Hospital Account [id number])    Name Relation to Pt Service Area Active? Acct Type   Charlotte Fisher, [facility]   Address Phone       [address] [place], [place] [address] [phone])          Coverage Information (for Hospital Account [id number])    F/O Payor/Plan Precert #   [facility]    Subscriber Subscriber #   Charlotte Fisher, Charlotte Fisher [id number]   Address Phone   [address] [place], [place] [address] [phone]      Legal Guardian:     Primary Care Provider:  PROVIDER NOT IN SYSTEM  Current Outpatient Providers:  None  Psychiatrist:     Counselor/Therapist:     Compliant with Medications:  No  Additional Information:   Charlotte BillsGuthrie, Charlotte Fisher 11/5/20154:44 PM

## 2014-05-04 NOTE — BH Assessment (Signed)
[facility] Dr. Jannifer FranklinAkintayo and Assunta FoundShuvon Rankin, NP. Patient assigned to bed #3. Nursing report [phone]. Support paperwork completed by LCSW-Kristen.

## 2014-05-05 DIAGNOSIS — F1414 Cocaine abuse with cocaine-induced mood disorder: Secondary | ICD-10-CM | POA: Diagnosis not present

## 2014-05-05 DIAGNOSIS — F332 Major depressive disorder, recurrent severe without psychotic features: Secondary | ICD-10-CM

## 2014-05-05 DIAGNOSIS — F1994 Other psychoactive substance use, unspecified with psychoactive substance-induced mood disorder: Secondary | ICD-10-CM

## 2014-05-05 MED ORDER — CITALOPRAM HYDROBROMIDE 10 MG PO TABS: 10.0000 mg | ORAL_TABLET | Freq: Every day | ORAL | Status: DC

## 2014-05-05 MED ORDER — GABAPENTIN 400 MG PO CAPS: 800.0000 mg | ORAL_CAPSULE | Freq: Every day | ORAL | Status: DC

## 2014-05-05 MED ORDER — METHOCARBAMOL 500 MG PO TABS: 500.0000 mg | ORAL_TABLET | Freq: Three times a day (TID) | ORAL | Status: DC | PRN

## 2014-05-05 MED ORDER — IPRATROPIUM-ALBUTEROL 20-100 MCG/ACT IN AERS: 1.0000 | INHALATION_SPRAY | Freq: Four times a day (QID) | RESPIRATORY_TRACT | Status: DC

## 2014-05-05 MED ORDER — HYDROXYZINE HCL 50 MG PO TABS: 50.0000 mg | ORAL_TABLET | Freq: Three times a day (TID) | ORAL | Status: DC | PRN

## 2014-05-05 MED ORDER — MELOXICAM 15 MG PO TABS: 15.0000 mg | ORAL_TABLET | Freq: Every day | ORAL | Status: DC

## 2014-05-05 MED ORDER — POTASSIUM CHLORIDE CRYS ER 10 MEQ PO TBCR
30.0000 meq | EXTENDED_RELEASE_TABLET | Freq: Once | ORAL | Status: AC
  Administered 2014-05-05: 30 meq via ORAL
  Filled 2014-05-05: qty 3

## 2014-05-05 MED ORDER — PANTOPRAZOLE SODIUM 40 MG PO TBEC: 40.0000 mg | DELAYED_RELEASE_TABLET | Freq: Every day | ORAL | Status: DC

## 2014-05-05 MED ORDER — GABAPENTIN 400 MG PO CAPS: 400.0000 mg | ORAL_CAPSULE | Freq: Two times a day (BID) | ORAL | Status: DC

## 2014-05-05 MED ORDER — LISINOPRIL 10 MG PO TABS: 10.0000 mg | ORAL_TABLET | Freq: Every day | ORAL | Status: DC

## 2014-05-05 MED ORDER — GABAPENTIN 400 MG PO CAPS
400.0000 mg | ORAL_CAPSULE | Freq: Two times a day (BID) | ORAL | Status: DC
  Filled 2014-05-05 (×4): qty 1

## 2014-05-05 MED ORDER — MELOXICAM 7.5 MG PO TABS
15.0000 mg | ORAL_TABLET | Freq: Every day | ORAL | Status: DC
  Administered 2014-05-05: 15 mg via ORAL
  Filled 2014-05-05 (×4): qty 2

## 2014-05-05 MED ORDER — ALBUTEROL SULFATE HFA 108 (90 BASE) MCG/ACT IN AERS: 2.0000 | INHALATION_SPRAY | Freq: Four times a day (QID) | RESPIRATORY_TRACT | Status: DC | PRN

## 2014-05-05 MED ORDER — AMITRIPTYLINE HCL 10 MG PO TABS: 10.0000 mg | ORAL_TABLET | Freq: Every day | ORAL | Status: DC

## 2014-05-05 MED ORDER — IPRATROPIUM-ALBUTEROL 20-100 MCG/ACT IN AERS
1.0000 | INHALATION_SPRAY | Freq: Four times a day (QID) | RESPIRATORY_TRACT | Status: DC
  Administered 2014-05-05: 1 via RESPIRATORY_TRACT
  Filled 2014-05-05: qty 4

## 2014-05-05 NOTE — ED Notes (Signed)
Patient needed D/C paperwork

## 2014-05-05 NOTE — Progress Notes (Signed)
Pt alert, oriented and cooperative. Denies SI/HI, verbally contracts for safety. -A/[place]. Emotional support and encouragement given. Will continue to monitor closely and evaluate for stabilization.
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
## 2014-05-05 NOTE — Progress Notes (Signed)
Pt resting in bed with eyes closed. No distress or discomfort noted. Will continue to monitor closely.

## 2014-05-05 NOTE — Progress Notes (Signed)
Pt is anxious and needy on the unit. Pt has requested pain medication multiple times this morning and refuses pain medications that are ordered. Pt took scheduled naproxen and reports no change in pain level. A:Offered support, encouragement, redirection and 15 minute checks.R:Pt denies si and hi. Safety maintained in the OBS unit.

## 2014-05-05 NOTE — Progress Notes (Signed)
Patient ID: Charlotte DuttonMelanie Fisher, female   DOB: 05/04/1966, 48 y.o.   MRN: [id number] DISCHARGE NOTE:  Patient discharged at 1320 from observation unit. Discharge instructions reviewed with patient, including medications and suicide information. Social worker discussed follow-up care with patient prior to discharge. Patient provided with copy of discharge instructions and prescriptions. Patient indicated that she understood discharge instructions and asked appropriate questions. Patient currently denies SI and HI. Patient states that she experiences chronic pain and was medicated for pain as ordered. Patient contacted support person [place] [facility] and was awaiting ride after discharge.
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
## 2014-06-08 ENCOUNTER — Emergency Department (HOSPITAL_COMMUNITY)
Admission: EM | Admit: 2014-06-08 | Discharge: 2014-06-08 | Disposition: A | Discharge status: Home / Self Care | Payer: Medicaid Other | Attending: Emergency Medicine | Admitting: Emergency Medicine

## 2014-06-08 ENCOUNTER — Emergency Department (HOSPITAL_COMMUNITY): Payer: Medicaid Other

## 2014-06-08 ENCOUNTER — Encounter (HOSPITAL_COMMUNITY): Payer: Self-pay

## 2014-06-08 DIAGNOSIS — N189 Chronic kidney disease, unspecified: Secondary | ICD-10-CM | POA: Insufficient documentation

## 2014-06-08 DIAGNOSIS — Y9289 Other specified places as the place of occurrence of the external cause: Secondary | ICD-10-CM | POA: Insufficient documentation

## 2014-06-08 DIAGNOSIS — J069 Acute upper respiratory infection, unspecified: Secondary | ICD-10-CM | POA: Insufficient documentation

## 2014-06-08 DIAGNOSIS — Z79899 Other long term (current) drug therapy: Secondary | ICD-10-CM | POA: Insufficient documentation

## 2014-06-08 DIAGNOSIS — Z3202 Encounter for pregnancy test, result negative: Secondary | ICD-10-CM | POA: Diagnosis not present

## 2014-06-08 DIAGNOSIS — Y9389 Activity, other specified: Secondary | ICD-10-CM | POA: Insufficient documentation

## 2014-06-08 DIAGNOSIS — S4992XA Unspecified injury of left shoulder and upper arm, initial encounter: Secondary | ICD-10-CM | POA: Diagnosis not present

## 2014-06-08 DIAGNOSIS — F419 Anxiety disorder, unspecified: Secondary | ICD-10-CM | POA: Diagnosis not present

## 2014-06-08 DIAGNOSIS — J441 Chronic obstructive pulmonary disease with (acute) exacerbation: Secondary | ICD-10-CM | POA: Diagnosis not present

## 2014-06-08 DIAGNOSIS — Z720 Tobacco use: Secondary | ICD-10-CM | POA: Diagnosis not present

## 2014-06-08 DIAGNOSIS — F329 Major depressive disorder, single episode, unspecified: Secondary | ICD-10-CM | POA: Insufficient documentation

## 2014-06-08 DIAGNOSIS — T1490XA Injury, unspecified, initial encounter: Secondary | ICD-10-CM

## 2014-06-08 DIAGNOSIS — Z791 Long term (current) use of non-steroidal anti-inflammatories (NSAID): Secondary | ICD-10-CM | POA: Diagnosis not present

## 2014-06-08 DIAGNOSIS — M79602 Pain in left arm: Secondary | ICD-10-CM

## 2014-06-08 DIAGNOSIS — R062 Wheezing: Secondary | ICD-10-CM

## 2014-06-08 DIAGNOSIS — Z8781 Personal history of (healed) traumatic fracture: Secondary | ICD-10-CM | POA: Diagnosis not present

## 2014-06-08 DIAGNOSIS — Y998 Other external cause status: Secondary | ICD-10-CM | POA: Insufficient documentation

## 2014-06-08 LAB — POC URINE PREG, ED: Preg Test, Ur: NEGATIVE

## 2014-06-08 MED ORDER — PREDNISONE 20 MG PO TABS
60.0000 mg | ORAL_TABLET | Freq: Once | ORAL | Status: AC
  Administered 2014-06-08: 60 mg via ORAL
  Filled 2014-06-08: qty 3

## 2014-06-08 MED ORDER — IPRATROPIUM-ALBUTEROL 20-100 MCG/ACT IN AERS: 1.0000 | INHALATION_SPRAY | Freq: Four times a day (QID) | RESPIRATORY_TRACT | Status: DC

## 2014-06-08 MED ORDER — ALBUTEROL SULFATE HFA 108 (90 BASE) MCG/ACT IN AERS: 1.0000 | INHALATION_SPRAY | Freq: Four times a day (QID) | RESPIRATORY_TRACT | Status: DC | PRN

## 2014-06-08 MED ORDER — PREDNISONE 20 MG PO TABS: 40.0000 mg | ORAL_TABLET | Freq: Every day | ORAL | Status: DC

## 2014-06-08 MED ORDER — NAPROXEN 500 MG PO TABS: 500.0000 mg | ORAL_TABLET | Freq: Once | ORAL | Status: DC

## 2014-06-08 MED ORDER — KETOROLAC TROMETHAMINE 30 MG/ML IJ SOLN
60.0000 mg | INTRAMUSCULAR | Status: AC
  Administered 2014-06-08: 60 mg via INTRAMUSCULAR
  Filled 2014-06-08: qty 2

## 2014-06-08 MED ORDER — MOMETASONE FURO-FORMOTEROL FUM 100-5 MCG/ACT IN AERO: 2.0000 | INHALATION_SPRAY | Freq: Two times a day (BID) | RESPIRATORY_TRACT | Status: DC

## 2014-06-08 MED ORDER — IPRATROPIUM BROMIDE 0.02 % IN SOLN
0.5000 mg | RESPIRATORY_TRACT | Status: AC
  Administered 2014-06-08: 0.5 mg via RESPIRATORY_TRACT
  Filled 2014-06-08: qty 2.5

## 2014-06-08 MED ORDER — NAPROXEN 500 MG PO TABS: 500.0000 mg | ORAL_TABLET | Freq: Two times a day (BID) | ORAL | Status: DC

## 2014-06-08 MED ORDER — ALBUTEROL SULFATE (2.5 MG/3ML) 0.083% IN NEBU
5.0000 mg | INHALATION_SOLUTION | Freq: Once | RESPIRATORY_TRACT | Status: AC
  Administered 2014-06-08: 5 mg via RESPIRATORY_TRACT
  Filled 2014-06-08: qty 6
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
## 2014-06-08 NOTE — Discharge Instructions (Signed)
Please follow the directions provided.  Be sure to follow-up with your primary care provider or use the resource guide to establish care with a primary care provider to help manage your chronic medical problems.  You may use the naproxen twice a day for your arm pain.  Please take the prednisone daily to help with your upper respiratory infection along with your other prescribed breathing medicines. Wear your sling for comfort.  Don't hesitate to return for any new, worsening or concerning symptoms   SEEK IMMEDIATE MEDICAL CARE IF:  You have a fever.  You develop severe or persistent headache, ear pain, sinus pain, or chest pain.  You develop wheezing, a prolonged cough, cough up blood, or have a change in your usual mucus (if you have chronic lung disease).  You develop sore muscles or a stiff neck.  SEEK IMMEDIATE MEDICAL CARE IF:  Your skin or nails in the injured arm turn blue or gray.  Your arm feels cold or numb.   Emergency Department Resource Guide 1) Find a Doctor and Pay Out of Pocket Although you won't have to find out who is covered by your insurance plan, it is a good idea to ask around and get recommendations. You will then need to call the office and see if the doctor you have chosen will accept you as a new patient and what types of options they offer for patients who are self-pay. Some doctors offer discounts or will set up payment plans for their patients who do not have insurance, but you will need to ask so you aren't surprised when you get to your appointment.  2) Contact Your Local Health Department Not all health departments have doctors that can see patients for sick visits, but many do, so it is worth a call to see if yours does. If you don't know where your local health department is, you can check in your phone book. The CDC also has a tool to help you locate your state's health department, and many state websites also have listings of all of their local health  departments.  3) Find a Walk-in Clinic If your illness is not likely to be very severe or complicated, you may want to try a walk in clinic. These are popping up all over the country in pharmacies, drugstores, and shopping centers. They're usually staffed by nurse practitioners or physician assistants that have been trained to treat common illnesses and complaints. They're usually fairly quick and inexpensive. However, if you have serious medical issues or chronic medical problems, these are probably not your best option.  No Primary Care Doctor: - Call Health Connect at  [phone] - they can help you locate a primary care doctor that  accepts your insurance, provides certain services, etc. - Physician Referral Service- [phone]  Chronic Pain Problems: Organization         Address  Phone   Notes  [facility]  [phone] Patients need to be referred by their primary care doctor.   Medication Assistance: Organization         Address  Phone   Notes  [facility] Medication [facility] [address] [place], [place] [address] [phone] --Must be a resident of [facility] -- Must have NO insurance coverage whatsoever (no Medicaid/ Medicare, etc.) -- The pt. MUST have a primary care doctor that directs their care regularly and follows them in the community   [facility]ist  [phone]   [place]  [address]  [phone]    Agencies that provide inexpensive medical care: Organization         Address  Phone   Notes  [facility]  [phone]) [phone]   [facility]    [phone]) [phone]   [facility] [address] [place], [place] [address] [phone]) [phone]   [facility] [place] [address], [place] [phone]) [phone]   [facility]anned Parenthood    [phone]) [phone]   [facility]    [phone]) [phone]   [facility]  [address], [place] Phone:  [phone](336)  [phone], Fax:  [phone] Hours of Operation:  9 am - 6 pm, M-F.  Also accepts Medicaid/Medicare and self-pay.  [facility]  [address], [place] Phone: [phone](336) [phone], Fax: [phone]) [phone]. Hours of Operation:  8:30 am - 5:30 pm, M-F.  Also accepts Medicaid and self-pay.  [facility] [place] [address], [place] Phone: [phone](336) [phone]   [facility] [address], [place], [place] [phone], Ext. [phone] Mondays & Thursdays: 7-9 AM.  First 15 patients are seen on a first come, first serve basis.    Medicaid-accepting [facility] Providers:  Organization         Address  Phone   Notes  [facility] [address], [place] [phone]) [phone] Also accepts self-pay patients.  [facility] [address] [place], [place]  [phone]) [phone]   [facility] [address], [place] [phone]) [phone]   [facility] [address] [place] [address], [place] [phone]) [phone]   Renaye RakersVeita Bland [address], [place]   [phone]) [phone] Only accepts [place] Access [place] patients after they have their name applied to their card.   Self-Pay (no insurance) in [facility]:  Organization         Address  Phone   Notes  Sickle Cell Patients, [facility] [address], [place] [phone](336) [phone]   [facility] [address], [place] [phone]) [phone]   [facility] [place]  [address], [place] [phone]) [phone]   [facility]/Dr. Osei-Bonsu  [address] [place] [address], [place] or [phone], [place] [phone]) [phone] Phone number for both [place] and [place] locations is the same.  [facility] [address], [place] [phone]) [phone]   [facility] [address] [place] [address], [place] or [address] [phone](336)  [phone](336) [phone]   [facility] [address], [place] [phone](336) [phone], phone; [phone]) [phone], fax Sees patients 1st and 3rd Saturday of every month.  Must not qualify for public or private insurance (i.e. Medicaid, Medicare, [place] Health Choice, Veterans' Benefits)  Household income should be no more than 200% of the poverty level The clinic cannot treat you if you are pregnant or think you are pregnant  Sexually transmitted diseases are not treated at the clinic.    Dental Care: Organization         Address  Phone  Notes  [facility] Department of [facility] [address], [place] [phone]) [phone] Accepts children up to age 48 who are enrolled in [place]icaid or [place] Health Choice; pregnant women with a Medicaid card; and  children who have applied for Medicaid or [place] Health Choice, but were declined, whose parents can pay a reduced fee at time of service.  [facility] Department of [facility]  [address], [place] [phone] Accepts children up to age 79 who are enrolled in [place] or [place] Health Choice; pregnant women with a Medicaid card; and children who have applied for Medicaid or [place] Health Choice, but were declined, whose parents can pay a reduced fee at time of service.  [facility]  [address] [place], [place] [phone] Patients are seen by appointment only. Walk-ins are not accepted. [facility] will see patients 44 years of age and older. Monday - Tuesday (8am-5pm) Most Wednesdays (8:30-5pm) $30 per visit, cash only  [facility]  [address], [facility] [phone] Patients are seen by appointment only. Walk-ins are not accepted. [facility] will see patients 63 years of age and older. One Wednesday Evening (Monthly: Volunteer Based).  $30 per visit, cash only  [employer]  [phone] for adults;  Children under age 27, call Graduate Pediatric Dentistry at [phone]. Children aged 67-14, please call [phone] to request a pediatric application.  Dental services are provided in all areas of dental care including fillings, crowns and bridges, complete and partial dentures, implants, gum treatment, root canals, and extractions. Preventive care is also provided. Treatment is provided to both adults and children. Patients are selected via a lottery and there is often a waiting list.   [facility] [address] [place]  [phone] [email or web address]   [facility] [address] [place], [place] [phone], Ext. [phone] Second and Fourth Thursday of each month, opens at 6:30 AM; Clinic ends at 9 AM.  Patients are seen on a first-come first-served basis, and a limited number are seen during each clinic.   [facility]  [address] [place], [place] [phone]   Eligibility Requirements You must have lived in [place], [place], or [place] counties for at least the last three months.   You cannot be eligible for state or federal sponsored [place], including [employer], [place], or [employer].   You generally cannot be eligible for healthcare insurance through your employer.    How to apply: Eligibility screenings are held every Tuesday and Wednesday afternoon from 1:00 pm until 4:00 pm. You do not need an appointment for the interview!  [facility] [address], [place], [place] [phone]   [facility] Health Department  [phone]   [facility] Health Department  [phone]   [facility] Health Department  [phone]    Behavioral Health Resources in the Community: Intensive Outpatient Programs Organization         Address  Phone  Notes  [facility] [address] [place], [place] [phone]   [facility]patient [address] [place], [place] [phone]   [facility] [address] [place], [place]  [phone]   [facility] [address]  [place], [place] [phone] or [phone]   Substance Abuse Resources Organization         Address  Phone  Notes  [facility] Drug Services  [phone]   [facility]  [phone]   [facility] [place]  [phone]   [facility]  [phone]   Residential &  Outpatient Substance Abuse Program  [phone]   Psychological Services Organization         Address  Phone  Notes  [facility]  [phone]- [phone]   [facility]  [phone]- [phone]   [facility] [phone], [place] [phone] or [phone]    Mobile Crisis Teams Organization         Address  Phone  Notes  [facility], Mobile Crisis Care Unit  [phone]   [facility]  [address] [place], [place] [phone]   Doristine LocksSharon DeEsch [address] [place] [phone]    Self-Help/Support Groups Organization         Address  Phone             Notes  [facility] [place] - variety of support groups  336- [phone] Call for more information  Narcotics Anonymous (NA), [facility]aring [facility] [address], [place]  2 meetings at this location   Statisticianesidential Treatment Programs Organization         Address  Phone  Notes  [facility] [address],    [place]  [phone]   [facility]  [address], [place] [phone], [place], [place] [phone]   [facility] [address], [place] [phone] Admissions: 8am-3pm M-F  [facility] [address]    [place], [place] [phone]   [facility] [address], [place], [place] [phone]   [facility] [address]  [place], [place] [phone]   [facility] - Intensive  Outpatient [address], [place], [place] [phone]   [facility] ([facility] [address]  [place], [place] [phone] or [phone]   [facility] (RT[facility]) [address] [place], [place] [phone] Accepts Medicaid  [facility] [address]  [place] [phone] Substance Abuse/Addiction Treatment   [facility] Behavioral Health Resources Organization         Address  Phone  Notes  [facility]  [phone]   Angie FavaJulie Brannon, PhD [address], [address] [place], [place]   [phone](336) [phone] or [phone](336) [phone]   [facility]   [address] [place], [place] [phone](336) [phone]   [facility] [address], [place], [place] [phone](336) [phone] Insurance/Medicaid/sponsorship through [facility] Families [address]                                    [place], [place] [phone](336) [phone] Therapy/tele-psych/case  [facility] [address].   [place], [place] [phone](336) [phone]    Dr. Lolly MustacheArfeen  [phone]   [facility] [place]  [employer] [facility] 1) [address], [place] 2) [address], [place] 3)  [address], [place] [phone](336) [phone]) [phone]  [phone](336) [phone]   [facility] Child Abuse Hotline [phone](336) [phone] or [phone](336) [phone] (After Hours)
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
## 2014-07-06 ENCOUNTER — Emergency Department (HOSPITAL_COMMUNITY): Payer: Medicaid Other

## 2014-07-06 ENCOUNTER — Encounter (HOSPITAL_COMMUNITY): Payer: Self-pay | Admitting: Emergency Medicine

## 2014-07-06 ENCOUNTER — Emergency Department (HOSPITAL_COMMUNITY)
Admission: EM | Admit: 2014-07-06 | Discharge: 2014-07-06 | Disposition: A | Discharge status: Home / Self Care | Payer: Medicaid Other | Attending: Emergency Medicine | Admitting: Emergency Medicine

## 2014-07-06 DIAGNOSIS — N189 Chronic kidney disease, unspecified: Secondary | ICD-10-CM | POA: Diagnosis not present

## 2014-07-06 DIAGNOSIS — J441 Chronic obstructive pulmonary disease with (acute) exacerbation: Secondary | ICD-10-CM | POA: Insufficient documentation

## 2014-07-06 DIAGNOSIS — R0602 Shortness of breath: Secondary | ICD-10-CM

## 2014-07-06 DIAGNOSIS — Z79899 Other long term (current) drug therapy: Secondary | ICD-10-CM | POA: Insufficient documentation

## 2014-07-06 DIAGNOSIS — Z720 Tobacco use: Secondary | ICD-10-CM | POA: Insufficient documentation

## 2014-07-06 DIAGNOSIS — F419 Anxiety disorder, unspecified: Secondary | ICD-10-CM | POA: Diagnosis not present

## 2014-07-06 DIAGNOSIS — Z791 Long term (current) use of non-steroidal anti-inflammatories (NSAID): Secondary | ICD-10-CM | POA: Insufficient documentation

## 2014-07-06 DIAGNOSIS — F329 Major depressive disorder, single episode, unspecified: Secondary | ICD-10-CM | POA: Diagnosis not present

## 2014-07-06 DIAGNOSIS — Z87828 Personal history of other (healed) physical injury and trauma: Secondary | ICD-10-CM | POA: Insufficient documentation

## 2014-07-06 DIAGNOSIS — Z7952 Long term (current) use of systemic steroids: Secondary | ICD-10-CM | POA: Diagnosis not present

## 2014-07-06 LAB — I-STAT CHEM 8, ED
BUN: <3 mg/dL — ABNORMAL LOW (ref 6–23)
CALCIUM ION: 1.11 mmol/L — AB (ref 1.12–1.23)
Chloride: 104 mEq/L (ref 96–112)
Creatinine, Ser: 0.9 mg/dL (ref 0.50–1.10)
GLUCOSE: 125 mg/dL — AB (ref 70–99)
HEMATOCRIT: 48 % — AB (ref 36.0–46.0)
HEMOGLOBIN: 16.3 g/dL — AB (ref 12.0–15.0)
Potassium: 3 mmol/L — ABNORMAL LOW (ref 3.5–5.1)
Sodium: 140 mmol/L (ref 135–145)
TCO2: 23 mmol/L (ref 0–100)

## 2014-07-06 LAB — CBC WITH DIFFERENTIAL/PLATELET
BASOS PCT: 1 % (ref 0–1)
Basophils Absolute: 0.1 10*3/uL (ref 0.0–0.1)
Eosinophils Absolute: 0.3 10*3/uL (ref 0.0–0.7)
Eosinophils Relative: 3 % (ref 0–5)
HCT: 43.7 % (ref 36.0–46.0)
Hemoglobin: 14.6 g/dL (ref 12.0–15.0)
Lymphocytes Relative: 30 % (ref 12–46)
Lymphs Abs: 3.9 10*3/uL (ref 0.7–4.0)
MCH: 31.3 pg (ref 26.0–34.0)
MCHC: 33.4 g/dL (ref 30.0–36.0)
MCV: 93.8 fL (ref 78.0–100.0)
Monocytes Absolute: 1.1 10*3/uL — ABNORMAL HIGH (ref 0.1–1.0)
Monocytes Relative: 9 % (ref 3–12)
NEUTROS ABS: 7.3 10*3/uL (ref 1.7–7.7)
NEUTROS PCT: 57 % (ref 43–77)
PLATELETS: 249 10*3/uL (ref 150–400)
RBC: 4.66 MIL/uL (ref 3.87–5.11)
RDW: 14.3 % (ref 11.5–15.5)
WBC: 12.7 10*3/uL — ABNORMAL HIGH (ref 4.0–10.5)

## 2014-07-06 LAB — I-STAT TROPONIN, ED: TROPONIN I, POC: 0 ng/mL (ref 0.00–0.08)

## 2014-07-06 MED ORDER — AZITHROMYCIN 250 MG PO TABS: 250.0000 mg | ORAL_TABLET | Freq: Every day | ORAL | Status: DC

## 2014-07-06 MED ORDER — ALBUTEROL SULFATE HFA 108 (90 BASE) MCG/ACT IN AERS: 2.0000 | INHALATION_SPRAY | RESPIRATORY_TRACT | Status: DC | PRN

## 2014-07-06 MED ORDER — TRAMADOL HCL 50 MG PO TABS
50.0000 mg | ORAL_TABLET | Freq: Once | ORAL | Status: AC
  Administered 2014-07-06: 50 mg via ORAL
  Filled 2014-07-06: qty 1

## 2014-07-06 MED ORDER — SODIUM CHLORIDE 0.9 % IV BOLUS (SEPSIS)
1000.0000 mL | Freq: Once | INTRAVENOUS | Status: AC
  Administered 2014-07-06: 1000 mL via INTRAVENOUS

## 2014-07-06 MED ORDER — PREDNISONE 20 MG PO TABS: 40.0000 mg | ORAL_TABLET | Freq: Every day | ORAL | Status: DC

## 2014-07-06 MED ORDER — TRAMADOL HCL 50 MG PO TABS: 50.0000 mg | ORAL_TABLET | Freq: Four times a day (QID) | ORAL | Status: DC | PRN

## 2014-07-06 MED ORDER — MOMETASONE FURO-FORMOTEROL FUM 100-5 MCG/ACT IN AERO: 2.0000 | INHALATION_SPRAY | Freq: Two times a day (BID) | RESPIRATORY_TRACT | Status: DC

## 2014-07-06 MED ORDER — PREDNISONE 20 MG PO TABS
60.0000 mg | ORAL_TABLET | Freq: Once | ORAL | Status: AC
  Administered 2014-07-06: 60 mg via ORAL
  Filled 2014-07-06: qty 3

## 2014-07-06 MED ORDER — IPRATROPIUM-ALBUTEROL 20-100 MCG/ACT IN AERS: 1.0000 | INHALATION_SPRAY | Freq: Four times a day (QID) | RESPIRATORY_TRACT | Status: DC

## 2014-07-06 MED ORDER — POTASSIUM CHLORIDE CRYS ER 20 MEQ PO TBCR
40.0000 meq | EXTENDED_RELEASE_TABLET | Freq: Once | ORAL | Status: AC
  Administered 2014-07-06: 40 meq via ORAL
  Filled 2014-07-06: qty 2

## 2014-07-06 NOTE — Discharge Instructions (Signed)
Please follow directions provided. Be sure to follow-up with your primary care doctor to ensure you're getting better. Take the antibiotic 2 pills today, followed by one pill for the next 4 days. Take the prednisone daily to help with your symptoms. Use your inhaler 2 puffs every 4 hours as needed for cough and wheezing. Please avoid drug use and smoking. Don't hesitate to return for any new, worsening, or concerning symptoms.   SEEK IMMEDIATE MEDICAL CARE IF:  You have shortness of breath while you are resting.  You have shortness of breath that prevents you from:  Being able to talk.  Performing your usual physical activities.  You have chest pain lasting longer than 5 minutes.  Your skin color is more cyanotic than usual.
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
## 2014-07-06 NOTE — ED Notes (Signed)
Refuses additional vital signs.

## 2014-07-06 NOTE — ED Notes (Signed)
Patient stated she needs something for pain in her mouth due to burning it last night. Asked how she burned her mouth, she said she smoked crack last night until 0300. Initially denied drug use during triage, but states she has been smoking crack and injecting heroin.

## 2014-07-06 NOTE — ED Notes (Signed)
Patient returned from X-ray 

## 2014-07-06 NOTE — ED Notes (Addendum)
Reports SOB, wheezing, chest tightness for 3 days. History of COPD. Wheezing throughout on EMS arrival, albuterol treatment on scene, duo neb treatment in route. VSS. Reports less SOB after treatments. Also reports she has bad anxiety and has been out of all of her medication for 3 weeks due to moving.

## 2014-07-06 NOTE — ED Notes (Signed)
Patient transported to X-ray 

## 2014-07-10 ENCOUNTER — Emergency Department (HOSPITAL_COMMUNITY)
Admission: EM | Admit: 2014-07-10 | Discharge: 2014-07-10 | Disposition: A | Discharge status: Home / Self Care | Payer: Medicaid Other | Attending: Emergency Medicine | Admitting: Emergency Medicine

## 2014-07-10 DIAGNOSIS — F4310 Post-traumatic stress disorder, unspecified: Secondary | ICD-10-CM | POA: Insufficient documentation

## 2014-07-10 DIAGNOSIS — Z79899 Other long term (current) drug therapy: Secondary | ICD-10-CM | POA: Diagnosis not present

## 2014-07-10 DIAGNOSIS — F329 Major depressive disorder, single episode, unspecified: Secondary | ICD-10-CM | POA: Insufficient documentation

## 2014-07-10 DIAGNOSIS — Z792 Long term (current) use of antibiotics: Secondary | ICD-10-CM | POA: Insufficient documentation

## 2014-07-10 DIAGNOSIS — J441 Chronic obstructive pulmonary disease with (acute) exacerbation: Secondary | ICD-10-CM | POA: Diagnosis not present

## 2014-07-10 DIAGNOSIS — Z87828 Personal history of other (healed) physical injury and trauma: Secondary | ICD-10-CM | POA: Insufficient documentation

## 2014-07-10 DIAGNOSIS — Z7952 Long term (current) use of systemic steroids: Secondary | ICD-10-CM | POA: Insufficient documentation

## 2014-07-10 DIAGNOSIS — N189 Chronic kidney disease, unspecified: Secondary | ICD-10-CM | POA: Diagnosis not present

## 2014-07-10 DIAGNOSIS — Z720 Tobacco use: Secondary | ICD-10-CM | POA: Diagnosis not present

## 2014-07-10 DIAGNOSIS — R0602 Shortness of breath: Secondary | ICD-10-CM | POA: Diagnosis present

## 2014-07-10 DIAGNOSIS — Z791 Long term (current) use of non-steroidal anti-inflammatories (NSAID): Secondary | ICD-10-CM | POA: Insufficient documentation

## 2014-07-10 DIAGNOSIS — Z7951 Long term (current) use of inhaled steroids: Secondary | ICD-10-CM | POA: Diagnosis not present

## 2014-07-10 DIAGNOSIS — F1410 Cocaine abuse, uncomplicated: Secondary | ICD-10-CM | POA: Insufficient documentation

## 2014-07-10 DIAGNOSIS — F419 Anxiety disorder, unspecified: Secondary | ICD-10-CM | POA: Insufficient documentation

## 2014-07-10 DIAGNOSIS — F1910 Other psychoactive substance abuse, uncomplicated: Secondary | ICD-10-CM

## 2014-07-10 MED ORDER — IPRATROPIUM BROMIDE 0.02 % IN SOLN
0.5000 mg | Freq: Once | RESPIRATORY_TRACT | Status: DC
  Filled 2014-07-10: qty 2.5

## 2014-07-10 MED ORDER — IPRATROPIUM BROMIDE 0.02 % IN SOLN
0.5000 mg | Freq: Once | RESPIRATORY_TRACT | Status: AC
  Administered 2014-07-10: 0.5 mg via RESPIRATORY_TRACT
  Filled 2014-07-10: qty 2.5

## 2014-07-10 MED ORDER — HYDROCODONE-ACETAMINOPHEN 5-325 MG PO TABS
2.0000 | ORAL_TABLET | Freq: Once | ORAL | Status: AC
  Administered 2014-07-10: 2 via ORAL
  Filled 2014-07-10: qty 2

## 2014-07-10 MED ORDER — ALBUTEROL SULFATE (2.5 MG/3ML) 0.083% IN NEBU
5.0000 mg | INHALATION_SOLUTION | Freq: Once | RESPIRATORY_TRACT | Status: AC
  Administered 2014-07-10: 5 mg via RESPIRATORY_TRACT
  Filled 2014-07-10: qty 6

## 2014-07-10 MED ORDER — ALBUTEROL SULFATE (2.5 MG/3ML) 0.083% IN NEBU
5.0000 mg | INHALATION_SOLUTION | Freq: Once | RESPIRATORY_TRACT | Status: DC
  Filled 2014-07-10: qty 6

## 2014-07-10 MED ORDER — ALBUTEROL SULFATE HFA 108 (90 BASE) MCG/ACT IN AERS
2.0000 | INHALATION_SPRAY | RESPIRATORY_TRACT | Status: DC | PRN
  Administered 2014-07-10: 2 via RESPIRATORY_TRACT
  Filled 2014-07-10: qty 6.7

## 2014-07-10 MED ORDER — LORAZEPAM 1 MG PO TABS
1.0000 mg | ORAL_TABLET | Freq: Once | ORAL | Status: AC
  Administered 2014-07-10: 1 mg via ORAL
  Filled 2014-07-10: qty 1

## 2014-07-10 MED ORDER — PREDNISONE 20 MG PO TABS
60.0000 mg | ORAL_TABLET | Freq: Once | ORAL | Status: AC
  Administered 2014-07-10: 60 mg via ORAL
  Filled 2014-07-10: qty 3
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
## 2014-07-10 NOTE — Progress Notes (Signed)
[facility] ED CM consulted to assist with medications for pt as confirmed by ED unit secretary, Misty StanleyLisa.  Reviewed CM consult, EPIC coverage for pt and attempt to reach CM with Misty StanleyLisa help but no answer.  This [facility] related to this pt having confirmed medicaid coverage via EPIC E coverage verification.

## 2014-07-10 NOTE — ED Notes (Addendum)
Per ems pt from home, pt co SOB x 2 wks . Pt was recently evaluated here for same issues, prescribed atb and albuterol inhaler, yet pr reports unable tp pay for prescription. Ems reported wheezes, received 2lb neb treatments. O2 sat 98% . HX COPD , no O2 t home. Pt states that she has no ride back home.

## 2014-07-10 NOTE — Progress Notes (Addendum)
  CARE MANAGEMENT ED NOTE 07/10/2014  Patient:  Charlotte Fisher,Charlotte Fisher   Account Number:  [id number]  Date Initiated:  07/10/2014  Documentation initiated by:  Edd ArbourGIBBS,Creston Klas  Subjective/Objective Assessment:   49 yr old [place] ems pt from home, pt co SOB x 2 wks . Pt was recently evaluated here for same issues, prescribed atb and albuterol inhaler, yet pr reports unable to pay for Rx     Subjective/Objective Assessment Detail:   No pcp per pt but listed in e medicaid response hx as cox family in [place] Pt reports to CM she has "cussed her out so I can not go back"  Pt with 6 ED visits and 2 admissions in last 6 months  During assessment pt noted to be very talkative, interruptive, unable to sit still in her hospital bed and aggressive. Pt repeatedly asked "who are you?" Pt told Cm she did not want to speak with CM after CM informed her [facility] Pt told CM to help her get home.  [facility] [place] and not having a ride home.  Pt reports not having support system in her home town Denies family, friends, church members "Just me"  Pt states she does not have transportation to go DSS CM encouraged her to call DSS Pt states "they don't answer. You have to go up there"     Action/Plan:   ED CM consult to assist with medications CM explained to pt that she is not eligible to receive [facility] medication assistance.   Action/Plan Detail:   Pt encouraged to Please contact the Dept of Social services case worker to have a new local medicaid accepting primary care doctor entered prior to going to one from list   Anticipated DC Date:  07/10/2014     Status Recommendation to Physician:   Result of Recommendation:    Other ED Services  Consult Working Plan    DC Planning Services  Other  Outpatient Services - Pt will follow up  PCP issues  CM consult    Choice offered to / List  presented to:            Status of service:  Completed, signed off  ED Comments:   ED Comments Detail:   07/10/13 1745 Pt noted to be verbally abusive, making complaints about CM & ED RN. Ikrame and rude towards ED RN. Discussed with pt her behavior would not be acceptable

## 2014-07-10 NOTE — ED Notes (Signed)
Bed: WA17 Expected date:  Expected time:  Means of arrival:  Comments: No bed  

## 2014-07-10 NOTE — Discharge Instructions (Signed)
It was our pleasure to provide your ER care today - we hope that you feel better.  Take your prednisone as prescribed. Use albuterol inhaler every 4 hours as need.  Avoid smoking and any drug use as that will make your breathing much worse.  Follow up with primary care doctor in coming week.  Also have your blood pressure rechecked then as it is high today.   Return to ER if worse, new symptoms, high fevers, trouble breathing, other concern.  You were given pain medication in the ER - no driving for the next 6 hours.    Chronic Obstructive Pulmonary Disease Chronic obstructive pulmonary disease (COPD) is a common lung condition in which airflow from the lungs is limited. COPD is a general term that can be used to describe many different lung problems that limit airflow, including both chronic bronchitis and emphysema. If you have COPD, your lung function will probably never return to normal, but there are measures you can take to improve lung function and make yourself feel better.  CAUSES   Smoking (common).   Exposure to secondhand smoke.   Genetic problems.  Chronic inflammatory lung diseases or recurrent infections. SYMPTOMS   Shortness of breath, especially with physical activity.   Deep, persistent (chronic) cough with a large amount of thick mucus.   Wheezing.   Rapid breaths (tachypnea).   Gray or bluish discoloration (cyanosis) of the skin, especially in fingers, toes, or lips.   Fatigue.   Weight loss.   Frequent infections or episodes when breathing symptoms become much worse (exacerbations).   Chest tightness. DIAGNOSIS  Your health care provider will take a medical history and perform a physical examination to make the initial diagnosis. Additional tests for COPD may include:   Lung (pulmonary) function tests.  Chest X-ray.  CT scan.  Blood tests. TREATMENT  Treatment available to help you feel better when you have COPD includes:    Inhaler and nebulizer medicines. These help manage the symptoms of COPD and make your breathing more comfortable.  Supplemental oxygen. Supplemental oxygen is only helpful if you have a low oxygen level in your blood.   Exercise and physical activity. These are beneficial for nearly all people with COPD. Some people may also benefit from a pulmonary rehabilitation program. HOME CARE INSTRUCTIONS   Take all medicines (inhaled or pills) as directed by your health care provider.  Avoid over-the-counter medicines or cough syrups that dry up your airway (such as antihistamines) and slow down the elimination of secretions unless instructed otherwise by your health care provider.   If you are a smoker, the most important thing that you can do is stop smoking. Continuing to smoke will cause further lung damage and breathing trouble. Ask your health care provider for help with quitting smoking. He or she can direct you to community resources or hospitals that provide support.  Avoid exposure to irritants such as smoke, chemicals, and fumes that aggravate your breathing.  Use oxygen therapy and pulmonary rehabilitation if directed by your health care provider. If you require home oxygen therapy, ask your health care provider whether you should purchase a pulse oximeter to measure your oxygen level at home.   Avoid contact with individuals who have a contagious illness.  Avoid extreme temperature and humidity changes.  Eat healthy foods. Eating smaller, more frequent meals and resting before meals may help you maintain your strength.  Stay active, but balance activity with periods of rest. Exercise and physical activity will  help you maintain your ability to do things you want to do.  Preventing infection and hospitalization is very important when you have COPD. Make sure to receive all the vaccines your health care provider recommends, especially the pneumococcal and influenza vaccines. Ask  your health care provider whether you need a pneumonia vaccine.  Learn and use relaxation techniques to manage stress.  Learn and use controlled breathing techniques as directed by your health care provider. Controlled breathing techniques include:   Pursed lip breathing. Start by breathing in (inhaling) through your nose for 1 second. Then, purse your lips as if you were going to whistle and breathe out (exhale) through the pursed lips for 2 seconds.   Diaphragmatic breathing. Start by putting one hand on your abdomen just above your waist. Inhale slowly through your nose. The hand on your abdomen should move out. Then purse your lips and exhale slowly. You should be able to feel the hand on your abdomen moving in as you exhale.   Learn and use controlled coughing to clear mucus from your lungs. Controlled coughing is a series of short, progressive coughs. The steps of controlled coughing are:   Lean your head slightly forward.   Breathe in deeply using diaphragmatic breathing.   Try to hold your breath for 3 seconds.   Keep your mouth slightly open while coughing twice.   Spit any mucus out into a tissue.   Rest and repeat the steps once or twice as needed. SEEK MEDICAL CARE IF:   You are coughing up more mucus than usual.   There is a change in the color or thickness of your mucus.   Your breathing is more labored than usual.   Your breathing is faster than usual.  SEEK IMMEDIATE MEDICAL CARE IF:   You have shortness of breath while you are resting.   You have shortness of breath that prevents you from:  Being able to talk.   Performing your usual physical activities.   You have chest pain lasting longer than 5 minutes.   Your skin color is more cyanotic than usual.  You measure low oxygen saturations for longer than 5 minutes with a pulse oximeter. MAKE SURE YOU:   Understand these instructions.  Will watch your condition.  Will get help right  away if you are not doing well or get worse. Document Released: 03/26/2005 Document Revised: 10/31/2013 Document Reviewed: 02/10/2013 [facility] Patient Information 2015 [place], [place]. This information is not intended to replace advice given to you by your health care provider. Make sure you discuss any questions you have with your health care provider.   Asthma Attack Prevention Although there is no way to prevent asthma from starting, you can take steps to control the disease and reduce its symptoms. Learn about your asthma and how to control it. Take an active role to control your asthma by working with your health care provider to create and follow an asthma action plan. An asthma action plan guides you in:  Taking your medicines properly.  Avoiding things that set off your asthma or make your asthma worse (asthma triggers).  Tracking your level of asthma control.  Responding to worsening asthma.  Seeking emergency care when needed. To track your asthma, keep records of your symptoms, check your peak flow number using a handheld device that shows how well air moves out of your lungs (peak flow meter), and get regular asthma checkups.  WHAT ARE SOME WAYS TO PREVENT AN ASTHMA ATTACK?  Take medicines as directed by your health care provider.  Keep track of your asthma symptoms and level of control.  With your health care provider, write a detailed plan for taking medicines and managing an asthma attack. Then be sure to follow your action plan. Asthma is an ongoing condition that needs regular monitoring and treatment.  Identify and avoid asthma triggers. Many outdoor allergens and irritants (such as pollen, mold, cold air, and air pollution) can trigger asthma attacks. Find out what your asthma triggers are and take steps to avoid them.  Monitor your breathing. Learn to recognize warning signs of an attack, such as coughing, wheezing, or shortness of breath. Your lung function may  decrease before you notice any signs or symptoms, so regularly measure and record your peak airflow with a home peak flow meter.  Identify and treat attacks early. If you act quickly, you are less likely to have a severe attack. You will also need less medicine to control your symptoms. When your peak flow measurements decrease and alert you to an upcoming attack, take your medicine as instructed and immediately stop any activity that may have triggered the attack. If your symptoms do not improve, get medical help.  Pay attention to increasing quick-relief inhaler use. If you find yourself relying on your quick-relief inhaler, your asthma is not under control. See your health care provider about adjusting your treatment. WHAT CAN MAKE MY SYMPTOMS WORSE? A number of common things can set off or make your asthma symptoms worse and cause temporary increased inflammation of your airways. Keep track of your asthma symptoms for several weeks, detailing all the environmental and emotional factors that are linked with your asthma. When you have an asthma attack, go back to your asthma diary to see which factor, or combination of factors, might have contributed to it. Once you know what these factors are, you can take steps to control many of them. If you have allergies and asthma, it is important to take asthma prevention steps at home. Minimizing contact with the substance to which you are allergic will help prevent an asthma attack. Some triggers and ways to avoid these triggers are: Animal Dander:  Some people are allergic to the flakes of skin or dried saliva from animals with fur or feathers.   There is no such thing as a hypoallergenic dog or cat breed. All dogs or cats can cause allergies, even if they don't shed.  Keep these pets out of your home.  If you are not able to keep a pet outdoors, keep the pet out of your bedroom and other sleeping areas at all times, and keep the door closed.  Remove  carpets and furniture covered with cloth from your home. If that is not possible, keep the pet away from fabric-covered furniture and carpets. Dust Mites: Many people with asthma are allergic to dust mites. Dust mites are tiny bugs that are found in every home in mattresses, pillows, carpets, fabric-covered furniture, bedcovers, clothes, stuffed toys, and other fabric-covered items.   Cover your mattress in a special dust-proof cover.  Cover your pillow in a special dust-proof cover, or wash the pillow each week in hot water. Water must be hotter than 130 F (54.4 C) to kill dust mites. Cold or warm water used with detergent and bleach can also be effective.  Wash the sheets and blankets on your bed each week in hot water.  Try not to sleep or lie on cloth-covered cushions.  Call ahead  when traveling and ask for a smoke-free hotel room. Bring your own bedding and pillows in case the hotel only supplies feather pillows and down comforters, which may contain dust mites and cause asthma symptoms.  Remove carpets from your bedroom and those laid on concrete, if you can.  Keep stuffed toys out of the bed, or wash the toys weekly in hot water or cooler water with detergent and bleach. Cockroaches: Many people with asthma are allergic to the droppings and remains of cockroaches.   Keep food and garbage in closed containers. Never leave food out.  Use poison baits, traps, powders, gels, or paste (for example, boric acid).  If a spray is used to kill cockroaches, stay out of the room until the odor goes away. Indoor Mold:  Fix leaky faucets, pipes, or other sources of water that have mold around them.  Clean floors and moldy surfaces with a fungicide or diluted bleach.  Avoid using humidifiers, vaporizers, or swamp coolers. These can spread molds through the air. Pollen and Outdoor Mold:  When pollen or mold spore counts are high, try to keep your windows closed.  Stay indoors with  windows closed from late morning to afternoon. Pollen and some mold spore counts are highest at that time.  Ask your health care provider whether you need to take anti-inflammatory medicine or increase your dose of the medicine before your allergy season starts. Other Irritants to Avoid:  Tobacco smoke is an irritant. If you smoke, ask your health care provider how you can quit. Ask family members to quit smoking, too. Do not allow smoking in your home or car.  If possible, do not use a wood-burning stove, kerosene heater, or fireplace. Minimize exposure to all sources of smoke, including incense, candles, fires, and fireworks.  Try to stay away from strong odors and sprays, such as perfume, talcum powder, hair spray, and paints.  Decrease humidity in your home and use an indoor air cleaning device. Reduce indoor humidity to below 60%. Dehumidifiers or central air conditioners can do this.  Decrease house dust exposure by changing furnace and air cooler filters frequently.  Try to have someone else vacuum for you once or twice a week. Stay out of rooms while they are being vacuumed and for a short while afterward.  If you vacuum, use a dust mask from a hardware store, a double-layered or microfilter vacuum cleaner bag, or a vacuum cleaner with a HEPA filter.  Sulfites in foods and beverages can be irritants. Do not drink beer or wine or eat dried fruit, processed potatoes, or shrimp if they cause asthma symptoms.  Cold air can trigger an asthma attack. Cover your nose and mouth with a scarf on cold or windy days.  Several health conditions can make asthma more difficult to manage, including a runny nose, sinus infections, reflux disease, psychological stress, and sleep apnea. Work with your health care provider to manage these conditions.  Avoid close contact with people who have a respiratory infection such as a cold or the flu, since your asthma symptoms may get worse if you catch the  infection. Wash your hands thoroughly after touching items that may have been handled by people with a respiratory infection.  Get a flu shot every year to protect against the flu virus, which often makes asthma worse for days or weeks. Also get a pneumonia shot if you have not previously had one. Unlike the flu shot, the pneumonia shot does not need to be given  yearly. Medicines:  Talk to your health care provider about whether it is safe for you to take aspirin or non-steroidal anti-inflammatory medicines (NSAIDs). In a small number of people with asthma, aspirin and NSAIDs can cause asthma attacks. These medicines must be avoided by people who have known aspirin-sensitive asthma. It is important that people with aspirin-sensitive asthma read labels of all over-the-counter medicines used to treat pain, colds, coughs, and fever.  Beta-blockers and ACE inhibitors are other medicines you should discuss with your health care provider. HOW CAN I FIND OUT WHAT I AM ALLERGIC TO? Ask your asthma health care provider about allergy skin testing or blood testing (the RAST test) to identify the allergens to which you are sensitive. If you are found to have allergies, the most important thing to do is to try to avoid exposure to any allergens that you are sensitive to as much as possible. Other treatments for allergies, such as medicines and allergy shots (immunotherapy) are available.  CAN I EXERCISE? Follow your health care provider's advice regarding asthma treatment before exercising. It is important to maintain a regular exercise program, but vigorous exercise or exercise in cold, humid, or dry environments can cause asthma attacks, especially for those people who have exercise-induced asthma. Document Released: 06/04/2009 Document Revised: 06/21/2013 Document Reviewed: 12/22/2012 [facility] Patient Information 2015 [place], [place]. This information is not intended to replace advice given to you by your health  care provider. Make sure you discuss any questions you have with your health care provider.    Smoking Hazards Smoking cigarettes is extremely bad for your health. Tobacco smoke has over 200 known poisons in it. It contains the poisonous gases nitrogen oxide and carbon monoxide. There are over 60 chemicals in tobacco smoke that cause cancer. Some of the chemicals found in cigarette smoke include:   Cyanide.   Benzene.   Formaldehyde.   Methanol (wood alcohol).   Acetylene (fuel used in welding torches).   Ammonia.  Even smoking lightly shortens your life expectancy by several years. You can greatly reduce the risk of medical problems for you and your family by stopping now. Smoking is the most preventable cause of death and disease in our society. Within days of quitting smoking, your circulation improves, you decrease the risk of having a heart attack, and your lung capacity improves. There may be some increased phlegm in the first few days after quitting, and it may take months for your lungs to clear up completely. Quitting for 10 years reduces your risk of developing lung cancer to almost that of a nonsmoker.  WHAT ARE THE RISKS OF SMOKING? Cigarette smokers have an increased risk of many serious medical problems, including:  Lung cancer.   Lung disease (such as pneumonia, bronchitis, and emphysema).   Heart attack and chest pain due to the heart not getting enough oxygen (angina).   Heart disease and peripheral blood vessel disease.   Hypertension.   Stroke.   Oral cancer (cancer of the lip, mouth, or voice box).   Bladder cancer.   Pancreatic cancer.   Cervical cancer.   Pregnancy complications, including premature birth.   Stillbirths and smaller newborn babies, birth defects, and genetic damage to sperm.   Early menopause.   Lower estrogen level for women.   Infertility.   Facial wrinkles.   Blindness.   Increased risk of broken  bones (fractures).   Senile dementia.   Stomach ulcers and internal bleeding.   Delayed wound healing and increased risk of  complications during surgery. Because of secondhand smoke exposure, children of smokers have an increased risk of the following:   Sudden infant death syndrome (SIDS).   Respiratory infections.   Lung cancer.   Heart disease.   Ear infections.  WHY IS SMOKING ADDICTIVE? Nicotine is the chemical agent in tobacco that is capable of causing addiction or dependence. When you smoke and inhale, nicotine is absorbed rapidly into the bloodstream through your lungs. Both inhaled and noninhaled nicotine may be addictive.  WHAT ARE THE BENEFITS OF QUITTING?  There are many health benefits to quitting smoking. Some are:   The likelihood of developing cancer and heart disease decreases. Health improvements are seen almost immediately.   Blood pressure, pulse rate, and breathing patterns start returning to normal soon after quitting.   People who quit may see an improvement in their overall quality of life.  HOW DO YOU QUIT SMOKING? Smoking is an addiction with both physical and psychological effects, and longtime habits can be hard to change. Your health care provider can recommend:  Programs and community resources, which may include group support, education, or therapy.  Replacement products, such as patches, gum, and nasal sprays. Use these products only as directed. Do not replace cigarette smoking with electronic cigarettes (commonly called e-cigarettes). The safety of e-cigarettes is unknown, and some may contain harmful chemicals. FOR MORE INFORMATION  American Lung Association: www.lung.org  American Cancer Society: www.cancer.org Document Released: 07/24/2004 Document Revised: 04/06/2013 Document Reviewed: 12/06/2012 [facility] Patient Information 2015 [place], [place]. This information is not intended to replace advice given to you by your health  care provider. Make sure you discuss any questions you have with your health care provider.    Stimulant Use Disorder-Cocaine Cocaine is one of a group of powerful drugs called stimulants. Cocaine has medical uses for stopping nosebleeds and for pain control before minor nose or dental surgery. However, cocaine is misused because of the effects that it produces. These effects include:   A feeling of extreme pleasure.  Alertness.  High energy. Common street names for cocaine include coke, crack, blow, snow, and nose candy. Cocaine is snorted, dissolved in water and injected, or smoked.  Stimulants are addictive because they activate regions of the brain that produce both the pleasurable sensation of "reward" and psychological dependence. Together, these actions account for loss of control and the rapid development of drug dependence. This means you become ill without the drug (withdrawal) and need to keep using it to function.  Stimulant use disorder is use of stimulants that disrupts your daily life. It disrupts relationships with family and friends and how you do your job. Cocaine increases your blood pressure and heart rate. It can cause a heart attack or stroke. Cocaine can also cause death from irregular heart rate or seizures. SYMPTOMS Symptoms of stimulant use disorder with cocaine include:  Use of cocaine in larger amounts or over a longer period of time than intended.  Unsuccessful attempts to cut down or control cocaine use.  A lot of time spent obtaining, using, or recovering from the effects of cocaine.  A strong desire or urge to use cocaine (craving).  Continued use of cocaine in spite of major problems at work, school, or home because of use.  Continued use of cocaine in spite of relationship problems because of use.  Giving up or cutting down on important life activities because of cocaine use.  Use of cocaine over and over in situations when it is physically hazardous,  such as driving a car.  Continued use of cocaine in spite of a physical problem that is likely related to use. Physical problems can include:  Malnutrition.  Nosebleeds.  Chest pain.  High blood pressure.  A hole that develops between the part of your nose that separates your nostrils (perforated nasal septum).  Lung and kidney damage.  Continued use of cocaine in spite of a mental problem that is likely related to use. Mental problems can include:  Schizophrenia-like symptoms.  Depression.  Bipolar mood swings.  Anxiety.  Sleep problems.  Need to use more and more cocaine to get the same effect, or lessened effect over time with use of the same amount of cocaine (tolerance).  Having withdrawal symptoms when cocaine use is stopped, or using cocaine to reduce or avoid withdrawal symptoms. Withdrawal symptoms include:  Depressed or irritable mood.  Low energy or restlessness.  Bad dreams.  Poor or excessive sleep.  Increased appetite. DIAGNOSIS Stimulant use disorder is diagnosed by your health care provider. You may be asked questions about your cocaine use and how it affects your life. A physical exam may be done. A drug screen may be ordered. You may be referred to a mental health professional. The diagnosis of stimulant use disorder requires at least two symptoms within 12 months. The type of stimulant use disorder depends on the number of signs and symptoms you have. The type may be:  Mild. Two or three signs and symptoms.  Moderate. Four or five signs and symptoms.  Severe. Six or more signs and symptoms. TREATMENT Treatment for stimulant use disorder is usually provided by mental health professionals with training in substance use disorders. The following options are available:  Counseling or talk therapy. Talk therapy addresses the reasons you use cocaine and ways to keep you from using again. Goals of talk therapy include:  Identifying and avoiding  triggers for use.  Handling cravings.  Replacing use with healthy activities.  Support groups. Support groups provide emotional support, advice, and guidance.  Medicine. Certain medicines may decrease cocaine cravings or withdrawal symptoms. HOME CARE INSTRUCTIONS  Take medicines only as directed by your health care provider.  Identify the people and activities that trigger your cocaine use and avoid them.  Keep all follow-up visits as directed by your health care provider. SEEK MEDICAL CARE IF:  Your symptoms get worse or you relapse.  You are not able to take medicines as directed. SEEK IMMEDIATE MEDICAL CARE IF:  You have serious thoughts about hurting yourself or others.  You have a seizure, chest pain, sudden weakness, or loss of speech or vision. FOR MORE INFORMATION  National Institute on Drug Abuse: http://www.price-smith.com/  Substance Abuse and Mental Health Services Administration: [employer].pt Document Released: 06/13/2000 Document Revised: 10/31/2013 Document Reviewed: 06/29/2013 [facility] Patient Information 2015 [place], [place]. This information is not intended to replace advice given to you by your health care provider. Make sure you discuss any questions you have with your health care provider.    Hypertension Hypertension, commonly called high blood pressure, is when the force of blood pumping through your arteries is too strong. Your arteries are the blood vessels that carry blood from your heart throughout your body. A blood pressure reading consists of a higher number over a lower number, such as 110/72. The higher number (systolic) is the pressure inside your arteries when your heart pumps. The lower number (diastolic) is the pressure inside your arteries when your heart relaxes. Ideally you want your blood  pressure below 120/80. Hypertension forces your heart to work harder to pump blood. Your arteries may become narrow or stiff. Having hypertension puts you at  risk for heart disease, stroke, and other problems.  RISK FACTORS Some risk factors for high blood pressure are controllable. Others are not.  Risk factors you cannot control include:   Race. You may be at higher risk if you are African American.  Age. Risk increases with age.  Gender. Men are at higher risk than women before age 54 years. After age 72, women are at higher risk than men. Risk factors you can control include:  Not getting enough exercise or physical activity.  Being overweight.  Getting too much fat, sugar, calories, or salt in your diet.  Drinking too much alcohol. SIGNS AND SYMPTOMS Hypertension does not usually cause signs or symptoms. Extremely high blood pressure (hypertensive crisis) may cause headache, anxiety, shortness of breath, and nosebleed. DIAGNOSIS  To check if you have hypertension, your health care provider will measure your blood pressure while you are seated, with your arm held at the level of your heart. It should be measured at least twice using the same arm. Certain conditions can cause a difference in blood pressure between your right and left arms. A blood pressure reading that is higher than normal on one occasion does not mean that you need treatment. If one blood pressure reading is high, ask your health care provider about having it checked again. TREATMENT  Treating high blood pressure includes making lifestyle changes and possibly taking medicine. Living a healthy lifestyle can help lower high blood pressure. You may need to change some of your habits. Lifestyle changes may include:  Following the DASH diet. This diet is high in fruits, vegetables, and whole grains. It is low in salt, red meat, and added sugars.  Getting at least 2 hours of brisk physical activity every week.  Losing weight if necessary.  Not smoking.  Limiting alcoholic beverages.  Learning ways to reduce stress. If lifestyle changes are not enough to get your  blood pressure under control, your health care provider may prescribe medicine. You may need to take more than one. Work closely with your health care provider to understand the risks and benefits. HOME CARE INSTRUCTIONS  Have your blood pressure rechecked as directed by your health care provider.   Take medicines only as directed by your health care provider. Follow the directions carefully. Blood pressure medicines must be taken as prescribed. The medicine does not work as well when you skip doses. Skipping doses also puts you at risk for problems.   Do not smoke.   Monitor your blood pressure at home as directed by your health care provider. SEEK MEDICAL CARE IF:   You think you are having a reaction to medicines taken.  You have recurrent headaches or feel dizzy.  You have swelling in your ankles.  You have trouble with your vision. SEEK IMMEDIATE MEDICAL CARE IF:  You develop a severe headache or confusion.  You have unusual weakness, numbness, or feel faint.  You have severe chest or abdominal pain.  You vomit repeatedly.  You have trouble breathing. MAKE SURE YOU:   Understand these instructions.  Will watch your condition.  Will get help right away if you are not doing well or get worse. Document Released: 06/16/2005 Document Revised: 10/31/2013 Document Reviewed: 04/08/2013 [facility] Patient Information 2015 [place], [place]. This information is not intended to replace advice given to you by your health  care provider. Make sure you discuss any questions you have with your health care provider.   Cough, Adult  A cough is a reflex that helps clear your throat and airways. It can help heal the body or may be a reaction to an irritated airway. A cough may only last 2 or 3 weeks (acute) or may last more than 8 weeks (chronic).  CAUSES Acute cough:  Viral or bacterial infections. Chronic cough:  Infections.  Allergies.  Asthma.  Post-nasal  drip.  Smoking.  Heartburn or acid reflux.  Some medicines.  Chronic lung problems (COPD).  Cancer. SYMPTOMS   Cough.  Fever.  Chest pain.  Increased breathing rate.  High-pitched whistling sound when breathing (wheezing).  Colored mucus that you cough up (sputum). TREATMENT   A bacterial cough may be treated with antibiotic medicine.  A viral cough must run its course and will not respond to antibiotics.  Your caregiver may recommend other treatments if you have a chronic cough. HOME CARE INSTRUCTIONS   Only take over-the-counter or prescription medicines for pain, discomfort, or fever as directed by your caregiver. Use cough suppressants only as directed by your caregiver.  Use a cold steam vaporizer or humidifier in your bedroom or home to help loosen secretions.  Sleep in a semi-upright position if your cough is worse at night.  Rest as needed.  Stop smoking if you smoke. SEEK IMMEDIATE MEDICAL CARE IF:   You have pus in your sputum.  Your cough starts to worsen.  You cannot control your cough with suppressants and are losing sleep.  You begin coughing up blood.  You have difficulty breathing.  You develop pain which is getting worse or is uncontrolled with medicine.  You have a fever. MAKE SURE YOU:   Understand these instructions.  Will watch your condition.  Will get help right away if you are not doing well or get worse. Document Released: 12/13/2010 Document Revised: 09/08/2011 Document Reviewed: 12/13/2010 [facility] Patient Information 2015 [place], [place]. This information is not intended to replace advice given to you by your health care provider. Make sure you discuss any questions you have with your health care provider.       Emergency Department Resource Guide 1) Find a Doctor and Pay Out of Pocket Although you won't have to find out who is covered by your insurance plan, it is a good idea to ask around and get recommendations.  You will then need to call the office and see if the doctor you have chosen will accept you as a new patient and what types of options they offer for patients who are self-pay. Some doctors offer discounts or will set up payment plans for their patients who do not have insurance, but you will need to ask so you aren't surprised when you get to your appointment.  2) Contact Your Local Health Department Not all health departments have doctors that can see patients for sick visits, but many do, so it is worth a call to see if yours does. If you don't know where your local health department is, you can check in your phone book. The CDC also has a tool to help you locate your state's health department, and many state websites also have listings of all of their local health departments.  3) Find a Walk-in Clinic If your illness is not likely to be very severe or complicated, you may want to try a walk in clinic. These are popping up all over the country  in pharmacies, drugstores, and shopping centers. They're usually staffed by nurse practitioners or physician assistants that have been trained to treat common illnesses and complaints. They're usually fairly quick and inexpensive. However, if you have serious medical issues or chronic medical problems, these are probably not your best option.  No Primary Care Doctor: - Call Health Connect at  [phone] - they can help you locate a primary care doctor that  accepts your insurance, provides certain services, etc. - Physician Referral Service- [phone]  Chronic Pain Problems: Organization         Address  Phone   Notes  [facility]  [phone] Patients need to be referred by their primary care doctor.   Medication Assistance: Organization         Address  Phone   Notes  [facility] Medication [facility] [address] [place], [place] [address] [phone] --Must be a resident of [facility] --  Must have NO insurance coverage whatsoever (no Medicaid/ Medicare, etc.) -- The pt. MUST have a primary care doctor that directs their care regularly and follows them in the community   Med[facility]ist  [phone]   [employer]  [phone]    Agencies that provide inexpensive medical care: Organization         Address  Phone   Notes  [facility]  [phone]   [facility]    [phone]   [facility] [address] [place], [place] [address] [phone]   [facility] [address] [place] [place] [phone]   Planned Parenthood    [phone]   [facility]    [phone]   [facility]  [address], [place] Phone:  [phone], Fax:  [phone] Hours of Operation:  9 am - 6 pm, M-F.  Also accepts Medicaid/Medicare and self-pay.  [facility]  [address], [place] Phone: [phone], Fax: [phone]. Hours of Operation:  8:30 am - 5:30 pm, M-F.  Also accepts Medicaid and self-pay.  [facility] [place] [address] [place] Phone: [phone]   [facility] [address] Natasha Bence [place], [place] [phone], Ext. [phone] Mondays & Thursdays: 7-9 AM.  First 15 patients are seen on a first come, first serve basis.    Medicaid-accepting [facility] Providers:  Organization         Address  Phone   Notes  [facility] [address], [place] [phone] Also accepts self-pay patients.  [facility] [address] Laurell Josephs [place], [place]  [phone]   [facility] [address], [place] [phone]   [facility] [address], [place] [phone]   Renaye Rakers [address], [place]   [phone] Only accepts [place] Access [place] patients  after they have their name applied to their card.   Self-Pay (no insurance) in [facility]:  Organization         Address  Phone   Notes  Sickle Cell Patients, [facility] [address] [place], [place] [phone]   [facility] [address] [place], [place] [phone]   [facility] [place]  [address], [place] [phone]   [facility]/Dr. Osei-Bonsu  [address], [place] or [address], [place] [phone] Phone number for both [place] and [place] locations is the same.  [facility] [address] [place] [phone]   [facility] [address], [place] or [address] [phone]   [facility] [address] [place] [phone], phone; [phone], fax Sees patients 1st and 3rd Saturday of every month.  Must not qualify for public or private insurance (i.e. Medicaid, Medicare, [place] Health Choice, Veterans' Benefits)  Household income should be no more than 200% of the poverty level The clinic cannot treat you if you are pregnant or think you are pregnant  Sexually transmitted diseases are not treated at the clinic.    Dental Care: Organization         Address  Phone  Notes  [facility] Department of [facility] [address] [place], [place] [phone] Accepts children up to age 30 who are enrolled in [place] or [place] Health Choice; pregnant women with a Medicaid card; and children who have applied for Medicaid or [place] Health Choice, but were declined, whose parents can pay a reduced fee at time of service.  [facility] of [facility]  [address], [place] [phone] Accepts children up to age 48 who are enrolled in [place] or [place] Health Choice; pregnant women with a Medicaid card; and children  who have applied for Medicaid or [place] Health Choice, but were declined, whose parents can pay a reduced fee at time of service.  [facility]  [address] [place], [place] [phone] Patients are seen by appointment only. Walk-ins are not accepted. [facility] will see patients 22 years of age and older. Monday - Tuesday (8am-5pm) Most Wednesdays (8:30-5pm) $30 per visit, cash only  [facility] Adult Dental Access PRO[facility]  [address], [facility] [phone] Patients are seen by appointment only. Walk-ins are not accepted. [facility] will see patients 61 years of age and older. One Wednesday Evening (Monthly: Volunteer Based).  $30 per visit, cash only  [employer]  [phone] for adults; Children under age 11, call Graduate Pediatric Dentistry at [phone]. Children aged 7-14, please call [phone] to request a pediatric application.  Dental services are provided in all areas of dental care including fillings, crowns and bridges, complete and partial dentures, implants, gum treatment, root canals, and extractions. Preventive care is also provided. Treatment is provided to both adults and children. Patients are selected via a lottery and there is often a waiting list.   [facility] [address] [place]  [phone] [email or web address]   [facility] [address] [place], [place] [phone], Ext. [phone] Second and Fourth Thursday of each month, opens at 6:30 AM; Clinic ends at 9 AM.  Patients are seen on a first-come first-served basis, and a limited number are seen during each clinic.   [facility]  [address] Ether [place]ins [place], [place] [phone]   Eligibility Requirements You must have lived in [place], [place], or [place] counties for at least the last three months.   You cannot be eligible for state or federal sponsored [place], including [place], [place], or [employer].   You generally  cannot be eligible for healthcare insurance through your employer.    How to apply: Eligibility screenings are held every Tuesday and Wednesday afternoon from 1:00 pm until 4:00 pm. You do not need an appointment for the interview!  [facility] [address] [place], [place] [phone]   [facility] Health Department  [phone]   [facility] Health Department  [phone]   [facility] Health Department  [phone]    Behavioral Health Resources in the Community: Intensive Outpatient Programs Organization         Address  Phone  Notes  [facility] Services [address] [place], [place] [phone]   [facility] Outpatient [address] [place], [place] [phone]   [facility] [address] [place], [place]  [phone]   [facility] Mental Health [address],  [place], [place] [phone] or [phone]   Substance Abuse Resources Organization         Address  Phone  Notes  [facility] Drug [facility]  [phone]   [facility]  [phone]   [facility]  [phone]   Floydene Flock  [phone]   Residential & Outpatient Substance Abuse Program  [phone]   Psychological Services Organization         Address  Phone  Notes  [facility] Behavioral Health  [phone]   [facility] Services  [phone]   [facility] Mental Health [address], [place] [phone] or [phone]    Mobile Crisis Teams Organization         Address  Phone  Notes  [facility], Mobile Crisis Care Unit  [phone]   [facility]  [address] [place], [place] [phone]   Doristine Locks [address] [place] [phone]    Self-Help/Support Groups Organization         Address  Phone             Notes  [facility] [place] -  variety of support groups  336- I7437963 Call for more information  Narcotics Anonymous (NA), [facility] [address], [employer] [place]  2 meetings at this location   Statistician         Address  Phone  Notes  [facility] [address],    [place]  [phone]   [facility]  [address], [place] [address], [place], [place] [phone]   [facility] Treatment Facility [address] [place], [place] [phone] Admissions: 8am-3pm M-F  [facility] [address]    [place], [place] [phone]   [facility] [address] [place], [place], [place] [phone]   [facility] [address],  [place], [place] [phone]   [facility] - Intensive Outpatient [address] Laurell Joseph[address], [place], [place] [phone]   [facility] (Addiction Recovery [facility] [address] [place]  [place], [place] [phone] or [phone]   [facility] ([facility]) [address], [place], [place] [phone] Accepts Medicaid  [facility] [address],  [place] [phone] Substance Abuse/Addiction Treatment   [facility] Organization         Address  Phone  Notes  [facility]  [phone]   Angie Fava, PhD [address] Skeeters [place], [place]   [phone] or [phone]   [facility]   [address]  [address] [place], [place] [phone]   [facility] [address] [place], [place] [phone] Insurance/Medicaid/sponsorship through [facility] and Families [address]                                    [place], [place] [phone] Therapy/tele-psych/case  [facility] [address]   [place], [place] [phone]    Dr. Lolly Mustache  [phone]   [facility]  [employer] [facility] Dept. 1) [address], [place] 2) [address] [place] 3)  [address], [place] [phone]  [phone]   [facility] Child Abuse Hotline (567)460-8397 or (508) 679-0070 (After Hours)

## 2014-07-10 NOTE — Progress Notes (Signed)
Offered patient bus pass but patient refused.  Patient stated, "That isn't going to help me because I live in [place]."  [facility] notified EDSW.

## 2014-07-10 NOTE — Progress Notes (Signed)
CSW was consulted by nurse who states patient wanted to discuss transportation. Patient appeared to be frustrated. Patient states that she was BIB EMS today from home because she could not breathe. Patient states that she has COPD. Per note, patient presents to the ED with SOB. Patient reported to the ED via EMS for similar symptoms last week.  Patient informed CSW that she lives in [place] and does not have a ride home. Patient states that she does not have any money or know anyone who would be able to take her home. Patient was provided two bus basses and petty cash in the amount of $3.00 for transportation back home upon discharge.   Patient was given resources to catch the Part Bus.   Trish MageBrittney Macon [place], LCSWA [phone] ED CSW 07/10/2014 6:56 PM

## 2014-09-12 ENCOUNTER — Emergency Department (HOSPITAL_COMMUNITY)
Admission: EM | Admit: 2014-09-12 | Discharge: 2014-09-13 | Disposition: A | Discharge status: Other Acute Inpatient Hospital | Payer: Medicaid Other | Attending: Emergency Medicine | Admitting: Emergency Medicine

## 2014-09-12 ENCOUNTER — Emergency Department (HOSPITAL_COMMUNITY): Payer: Medicaid Other

## 2014-09-12 ENCOUNTER — Encounter (HOSPITAL_COMMUNITY): Payer: Self-pay | Admitting: *Deleted

## 2014-09-12 DIAGNOSIS — F1994 Other psychoactive substance use, unspecified with psychoactive substance-induced mood disorder: Secondary | ICD-10-CM

## 2014-09-12 DIAGNOSIS — Z720 Tobacco use: Secondary | ICD-10-CM | POA: Insufficient documentation

## 2014-09-12 DIAGNOSIS — J441 Chronic obstructive pulmonary disease with (acute) exacerbation: Secondary | ICD-10-CM

## 2014-09-12 DIAGNOSIS — F1410 Cocaine abuse, uncomplicated: Secondary | ICD-10-CM

## 2014-09-12 DIAGNOSIS — Z87828 Personal history of other (healed) physical injury and trauma: Secondary | ICD-10-CM | POA: Diagnosis not present

## 2014-09-12 DIAGNOSIS — F10230 Alcohol dependence with withdrawal, uncomplicated: Secondary | ICD-10-CM | POA: Diagnosis present

## 2014-09-12 DIAGNOSIS — N189 Chronic kidney disease, unspecified: Secondary | ICD-10-CM | POA: Insufficient documentation

## 2014-09-12 DIAGNOSIS — R45851 Suicidal ideations: Secondary | ICD-10-CM | POA: Diagnosis not present

## 2014-09-12 DIAGNOSIS — F1010 Alcohol abuse, uncomplicated: Secondary | ICD-10-CM

## 2014-09-12 DIAGNOSIS — R0602 Shortness of breath: Secondary | ICD-10-CM | POA: Diagnosis present

## 2014-09-12 DIAGNOSIS — F1320 Sedative, hypnotic or anxiolytic dependence, uncomplicated: Secondary | ICD-10-CM

## 2014-09-12 LAB — COMPREHENSIVE METABOLIC PANEL
ALT: 84 U/L — AB (ref 0–35)
AST: 72 U/L — AB (ref 0–37)
Albumin: 4.5 g/dL (ref 3.5–5.2)
Alkaline Phosphatase: 98 U/L (ref 39–117)
Anion gap: 9 (ref 5–15)
BUN: 19 mg/dL (ref 6–23)
CALCIUM: 9.4 mg/dL (ref 8.4–10.5)
CO2: 29 mmol/L (ref 19–32)
Chloride: 100 mmol/L (ref 96–112)
Creatinine, Ser: 1.11 mg/dL — ABNORMAL HIGH (ref 0.50–1.10)
GFR calc Af Amer: 67 mL/min — ABNORMAL LOW (ref >90)
GFR, EST NON AFRICAN AMERICAN: 58 mL/min — AB (ref >90)
Glucose, Bld: 100 mg/dL — ABNORMAL HIGH (ref 70–99)
POTASSIUM: 3.5 mmol/L (ref 3.5–5.1)
SODIUM: 138 mmol/L (ref 135–145)
Total Bilirubin: 0.5 mg/dL (ref 0.3–1.2)
Total Protein: 9 g/dL — ABNORMAL HIGH (ref 6.0–8.3)

## 2014-09-12 LAB — CBC
HCT: 49.8 % — ABNORMAL HIGH (ref 36.0–46.0)
Hemoglobin: 16.7 g/dL — ABNORMAL HIGH (ref 12.0–15.0)
MCH: 31 pg (ref 26.0–34.0)
MCHC: 33.5 g/dL (ref 30.0–36.0)
MCV: 92.6 fL (ref 78.0–100.0)
PLATELETS: 265 10*3/uL (ref 150–400)
RBC: 5.38 MIL/uL — ABNORMAL HIGH (ref 3.87–5.11)
RDW: 13 % (ref 11.5–15.5)
WBC: 12.8 10*3/uL — AB (ref 4.0–10.5)

## 2014-09-12 LAB — RAPID URINE DRUG SCREEN, HOSP PERFORMED
AMPHETAMINES: NOT DETECTED
BARBITURATES: NOT DETECTED
BENZODIAZEPINES: POSITIVE — AB
COCAINE: POSITIVE — AB
Opiates: NOT DETECTED
TETRAHYDROCANNABINOL: NOT DETECTED

## 2014-09-12 LAB — ACETAMINOPHEN LEVEL

## 2014-09-12 LAB — SALICYLATE LEVEL

## 2014-09-12 LAB — ETHANOL: Alcohol, Ethyl (B): <5 mg/dL (ref 0–9)

## 2014-09-12 MED ORDER — CHLORDIAZEPOXIDE HCL 25 MG PO CAPS
25.0000 mg | ORAL_CAPSULE | Freq: Once | ORAL | Status: DC
  Filled 2014-09-12: qty 1

## 2014-09-12 MED ORDER — PREDNISONE 20 MG PO TABS
60.0000 mg | ORAL_TABLET | Freq: Once | ORAL | Status: AC
  Administered 2014-09-12: 60 mg via ORAL
  Filled 2014-09-12: qty 3

## 2014-09-12 MED ORDER — PREDNISONE 10 MG PO TABS
60.0000 mg | ORAL_TABLET | Freq: Once | ORAL | Status: AC
Start: 2014-09-12 — End: 2014-09-13
  Filled 2014-09-12: qty 1

## 2014-09-12 MED ORDER — ALBUTEROL SULFATE (2.5 MG/3ML) 0.083% IN NEBU
5.0000 mg | INHALATION_SOLUTION | RESPIRATORY_TRACT | Status: AC
  Administered 2014-09-12: 5 mg via RESPIRATORY_TRACT
  Filled 2014-09-12: qty 6

## 2014-09-12 MED ORDER — ONDANSETRON 4 MG PO TBDP
4.0000 mg | ORAL_TABLET | Freq: Once | ORAL | Status: DC
  Filled 2014-09-12: qty 1

## 2014-09-12 NOTE — ED Notes (Signed)
Pt reports diff. Breathing x few days with vomiting.  Pt reports hx of asthma and COPD.  Pt reports she's a smoker.  Pt reports onset of sxs a week ago.  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  Pt reports last drink was today.

## 2014-09-13 ENCOUNTER — Encounter (HOSPITAL_COMMUNITY): Payer: Self-pay | Admitting: *Deleted

## 2014-09-13 ENCOUNTER — Inpatient Hospital Stay (HOSPITAL_COMMUNITY)
Admission: AD | Admit: 2014-09-13 | Discharge: 2014-09-18 | DRG: 897 | Disposition: A | Discharge status: Home / Self Care | Payer: Medicaid Other | Source: Intra-hospital | Attending: Psychiatry | Admitting: Psychiatry

## 2014-09-13 DIAGNOSIS — F1410 Cocaine abuse, uncomplicated: Secondary | ICD-10-CM

## 2014-09-13 DIAGNOSIS — F1024 Alcohol dependence with alcohol-induced mood disorder: Secondary | ICD-10-CM | POA: Diagnosis present

## 2014-09-13 DIAGNOSIS — J441 Chronic obstructive pulmonary disease with (acute) exacerbation: Secondary | ICD-10-CM | POA: Diagnosis not present

## 2014-09-13 DIAGNOSIS — F17200 Nicotine dependence, unspecified, uncomplicated: Secondary | ICD-10-CM | POA: Diagnosis present

## 2014-09-13 DIAGNOSIS — F10230 Alcohol dependence with withdrawal, uncomplicated: Secondary | ICD-10-CM | POA: Diagnosis present

## 2014-09-13 DIAGNOSIS — Y900 Blood alcohol level of less than 20 mg/100 ml: Secondary | ICD-10-CM | POA: Diagnosis present

## 2014-09-13 DIAGNOSIS — F10939 Alcohol use, unspecified with withdrawal, unspecified: Secondary | ICD-10-CM | POA: Insufficient documentation

## 2014-09-13 DIAGNOSIS — F1124 Opioid dependence with opioid-induced mood disorder: Secondary | ICD-10-CM | POA: Diagnosis not present

## 2014-09-13 DIAGNOSIS — F1320 Sedative, hypnotic or anxiolytic dependence, uncomplicated: Secondary | ICD-10-CM

## 2014-09-13 DIAGNOSIS — R45851 Suicidal ideations: Secondary | ICD-10-CM | POA: Diagnosis present

## 2014-09-13 DIAGNOSIS — F1994 Other psychoactive substance use, unspecified with psychoactive substance-induced mood disorder: Secondary | ICD-10-CM | POA: Diagnosis present

## 2014-09-13 DIAGNOSIS — J449 Chronic obstructive pulmonary disease, unspecified: Secondary | ICD-10-CM | POA: Diagnosis present

## 2014-09-13 DIAGNOSIS — F10239 Alcohol dependence with withdrawal, unspecified: Secondary | ICD-10-CM | POA: Insufficient documentation

## 2014-09-13 MED ORDER — PREDNISONE 20 MG PO TABS
40.0000 mg | ORAL_TABLET | Freq: Every day | ORAL | Status: DC
  Administered 2014-09-14 – 2014-09-18 (×5): 40 mg via ORAL
  Filled 2014-09-13 (×2): qty 2
  Filled 2014-09-13: qty 6
  Filled 2014-09-13 (×4): qty 2

## 2014-09-13 MED ORDER — LORAZEPAM 1 MG PO TABS: 0.0000 mg | ORAL_TABLET | Freq: Two times a day (BID) | ORAL | Status: DC

## 2014-09-13 MED ORDER — PREDNISONE 20 MG PO TABS
40.0000 mg | ORAL_TABLET | Freq: Every day | ORAL | Status: DC
  Administered 2014-09-13: 40 mg via ORAL
  Filled 2014-09-13: qty 2

## 2014-09-13 MED ORDER — ALUM & MAG HYDROXIDE-SIMETH 200-200-20 MG/5ML PO SUSP: 30.0000 mL | ORAL | Status: DC | PRN

## 2014-09-13 MED ORDER — CHLORDIAZEPOXIDE HCL 25 MG PO CAPS: ORAL_CAPSULE | ORAL | Status: DC

## 2014-09-13 MED ORDER — PREDNISONE 20 MG PO TABS: 40.0000 mg | ORAL_TABLET | Freq: Every day | ORAL | Status: DC

## 2014-09-13 MED ORDER — MAGNESIUM HYDROXIDE 400 MG/5ML PO SUSP
30.0000 mL | Freq: Every day | ORAL | Status: DC | PRN
  Administered 2014-09-16: 30 mL via ORAL
  Filled 2014-09-13: qty 30

## 2014-09-13 MED ORDER — LORAZEPAM 1 MG PO TABS
0.0000 mg | ORAL_TABLET | Freq: Four times a day (QID) | ORAL | Status: DC
  Administered 2014-09-13 – 2014-09-14 (×3): 1 mg via ORAL
  Filled 2014-09-13 (×3): qty 1

## 2014-09-13 MED ORDER — ALBUTEROL SULFATE HFA 108 (90 BASE) MCG/ACT IN AERS
1.0000 | INHALATION_SPRAY | Freq: Four times a day (QID) | RESPIRATORY_TRACT | Status: DC | PRN
Start: 2014-09-13 — End: 2014-09-18

## 2014-09-13 MED ORDER — ALBUTEROL SULFATE HFA 108 (90 BASE) MCG/ACT IN AERS
2.0000 | INHALATION_SPRAY | RESPIRATORY_TRACT | Status: DC | PRN
  Administered 2014-09-13: 2 via RESPIRATORY_TRACT
  Filled 2014-09-13: qty 6.7

## 2014-09-13 MED ORDER — PROMETHAZINE HCL 25 MG PO TABS: 25.0000 mg | ORAL_TABLET | Freq: Four times a day (QID) | ORAL | Status: DC | PRN

## 2014-09-13 MED ORDER — LORAZEPAM 1 MG PO TABS
0.0000 mg | ORAL_TABLET | Freq: Four times a day (QID) | ORAL | Status: DC
  Administered 2014-09-13: 2 mg via ORAL
  Administered 2014-09-13: 1 mg via ORAL
  Administered 2014-09-13: 2 mg via ORAL
  Filled 2014-09-13: qty 2
  Filled 2014-09-13: qty 1
  Filled 2014-09-13: qty 2

## 2014-09-13 MED ORDER — HYDROXYZINE HCL 25 MG PO TABS
25.0000 mg | ORAL_TABLET | Freq: Three times a day (TID) | ORAL | Status: DC | PRN
  Administered 2014-09-13 – 2014-09-14 (×3): 25 mg via ORAL
  Filled 2014-09-13 (×3): qty 1

## 2014-09-13 MED ORDER — ONDANSETRON HCL 4 MG PO TABS
4.0000 mg | ORAL_TABLET | Freq: Three times a day (TID) | ORAL | Status: DC | PRN
  Administered 2014-09-13 (×2): 4 mg via ORAL
  Filled 2014-09-13 (×2): qty 1

## 2014-09-13 MED ORDER — DM-GUAIFENESIN ER 30-600 MG PO TB12
1.0000 | ORAL_TABLET | Freq: Two times a day (BID) | ORAL | Status: DC | PRN
  Administered 2014-09-13 – 2014-09-14 (×2): 1 via ORAL
  Filled 2014-09-13 (×2): qty 1

## 2014-09-13 MED ORDER — ALBUTEROL SULFATE HFA 108 (90 BASE) MCG/ACT IN AERS
2.0000 | INHALATION_SPRAY | RESPIRATORY_TRACT | Status: DC | PRN
  Administered 2014-09-13 – 2014-09-17 (×7): 2 via RESPIRATORY_TRACT
  Filled 2014-09-13 (×2): qty 6.7

## 2014-09-13 MED ORDER — VITAMIN B-1 100 MG PO TABS
100.0000 mg | ORAL_TABLET | Freq: Every day | ORAL | Status: DC
  Administered 2014-09-14: 100 mg via ORAL
  Filled 2014-09-13 (×2): qty 1

## 2014-09-13 MED ORDER — QUETIAPINE FUMARATE 200 MG PO TABS
200.0000 mg | ORAL_TABLET | Freq: Every day | ORAL | Status: DC
  Administered 2014-09-13: 200 mg via ORAL
  Filled 2014-09-13 (×3): qty 1

## 2014-09-13 MED ORDER — IBUPROFEN 200 MG PO TABS
600.0000 mg | ORAL_TABLET | Freq: Three times a day (TID) | ORAL | Status: DC | PRN
  Administered 2014-09-13 (×2): 600 mg via ORAL
  Filled 2014-09-13 (×2): qty 3

## 2014-09-13 MED ORDER — IBUPROFEN 600 MG PO TABS
600.0000 mg | ORAL_TABLET | Freq: Three times a day (TID) | ORAL | Status: DC | PRN
  Administered 2014-09-13 – 2014-09-16 (×6): 600 mg via ORAL
  Filled 2014-09-13 (×7): qty 1

## 2014-09-13 MED ORDER — VITAMIN B-1 100 MG PO TABS
100.0000 mg | ORAL_TABLET | Freq: Every day | ORAL | Status: DC
  Administered 2014-09-13: 100 mg via ORAL
  Filled 2014-09-13: qty 1

## 2014-09-13 MED ORDER — ACETAMINOPHEN 325 MG PO TABS
650.0000 mg | ORAL_TABLET | Freq: Four times a day (QID) | ORAL | Status: DC | PRN
  Administered 2014-09-14 – 2014-09-17 (×2): 650 mg via ORAL
  Filled 2014-09-13 (×4): qty 2

## 2014-09-13 MED ORDER — THIAMINE HCL 100 MG/ML IJ SOLN: 100.0000 mg | Freq: Every day | INTRAMUSCULAR | Status: DC

## 2014-09-13 MED ORDER — GABAPENTIN 400 MG PO CAPS
400.0000 mg | ORAL_CAPSULE | Freq: Two times a day (BID) | ORAL | Status: DC
  Administered 2014-09-13 – 2014-09-16 (×6): 400 mg via ORAL
  Filled 2014-09-13 (×12): qty 1

## 2014-09-13 MED ORDER — ONDANSETRON HCL 4 MG PO TABS
4.0000 mg | ORAL_TABLET | Freq: Three times a day (TID) | ORAL | Status: DC | PRN
  Administered 2014-09-13 – 2014-09-14 (×2): 4 mg via ORAL
  Filled 2014-09-13 (×2): qty 1

## 2014-09-13 MED ORDER — AMITRIPTYLINE HCL 25 MG PO TABS
25.0000 mg | ORAL_TABLET | Freq: Every day | ORAL | Status: DC
  Administered 2014-09-13: 25 mg via ORAL
  Filled 2014-09-13 (×3): qty 1

## 2014-09-13 NOTE — Progress Notes (Deleted)
Recreation Therapy Notes  Date: 03.16.2016 Time: 9:30am Location: 300 Hall Group Room   Group Topic: Stress Management  Goal Area(s) Addresses:  Patient will actively participate in stress management techniques presented during session.   Behavioral Response: Did not attend.   Marykay Lexenise L Kaleiah Kutzer, LRT/CTRS  Jearl KlinefelterBlanchfield, Darah Simkin L 09/13/2014 2:33 PM

## 2014-09-13 NOTE — BH Assessment (Signed)
Sent referrals for placement incase [facility] not available in AM. Sent referrals to: [place] [facility] [place]  Ashayla Subia, [place] Triage Specialist 09/13/2014 2:25 AM

## 2014-09-13 NOTE — Progress Notes (Signed)
[facility] [phone] Pt discussed in SAPPU progression meeting Recommendation for inpatient  Pt is being followed by [facility] medicaid staff for for medications, pcp services, f/u appointments

## 2014-09-13 NOTE — Tx Team (Addendum)
Initial Interdisciplinary Treatment Plan   PATIENT STRESSORS: Educational concerns Financial difficulties Health problems Legal issue Substance abuse   PATIENT STRENGTHS: Ability for insight Active sense of humor Average or above average intelligence Capable of independent living   PROBLEM LIST: Problem List/Patient Goals Date to be addressed Date deferred Reason deferred Estimated date of resolution  Depression 09/13/14     Substance Abuse3/16/16      Risk for suicide      "alcohol"                                     DISCHARGE CRITERIA:  Ability to meet basic life and health needs Adequate post-discharge living arrangements Improved stabilization in mood, thinking, and/or behavior Medical problems require only outpatient monitoring  PRELIMINARY DISCHARGE PLAN: Attend aftercare/continuing care group Attend PHP/IOP Attend 12-step recovery group Outpatient therapy Participate in family therapy  PATIENT/FAMIILY INVOLVEMENT: This treatment plan has been presented to and reviewed with the patient, Charlotte DuttonMelanie Fisher, and/or family member, .  The patient and family have been given the opportunity to ask questions and make suggestions.  Rich BraveDuke, Patricia Lynn 09/13/2014, 5:21 PM
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
## 2014-09-13 NOTE — ED Notes (Signed)
Upon d/c informed patient about prescriptions and resource guide to follow up with PCP for outpatient detox. Patient now reporting SI "I will take every pill I have and kill myself". Dr. Lynelle DoctorKnapp made aware of same. IVC paperwork in progress.

## 2014-09-13 NOTE — Progress Notes (Signed)
D: Pt denies SI/HI/AVH. Pt is pleasant and cooperative. Pt concerned about her medications. Pt only focused on when and what medications she will get and can have. Pt forwards little else. Pt exhibbits med seeking behaviors.   A: Pt was offered support and encouragement. Pt was given scheduled medications. Pt was encourage to attend groups. Q 15 minute checks were done for safety.   R:Pt is taking medication. Pt has no complaints.Pt receptive to treatment and safety maintained on unit.

## 2014-09-13 NOTE — ED Notes (Signed)
[facility] with [facility] steady gait. [facility] dose on pills. Patient states " I can't live like this anymore". Patient also reports SOB at this time. Patient noted to have diffuse wheezing bilaterally. [facility]. Encouragement and support provided and safety maintain. Q 15 min safety checks in place.

## 2014-09-13 NOTE — Discharge Instructions (Signed)
Alcohol Withdrawal °Anytime drug use is interfering with normal living activities it has become abuse. This includes problems with family and friends. Psychological dependence has developed when your mind tells you that the drug is needed. This is usually followed by physical dependence when a continuing increase of drugs are required to get the same feeling or "high." This is known as addiction or chemical dependency. A person's risk is much higher if there is a history of chemical dependency in the family. °Mild Withdrawal Following Stopping Alcohol, When Addiction or Chemical Dependency Has Developed °When a person has developed tolerance to alcohol, any sudden stopping of alcohol can cause uncomfortable physical symptoms. Most of the time these are mild and consist of tremors in the hands and increases in heart rate, breathing, and temperature. Sometimes these symptoms are associated with anxiety, panic attacks, and bad dreams. There may also be stomach upset. Normal sleep patterns are often interrupted with periods of inability to sleep (insomnia). This may last for 6 months. Because of this discomfort, many people choose to continue drinking to get rid of this discomfort and to try to feel normal. °Severe Withdrawal with Decreased or No Alcohol Intake, When Addiction or Chemical Dependency Has Developed °About five percent of alcoholics will develop signs of severe withdrawal when they stop using alcohol. One sign of this is development of generalized seizures (convulsions). Other signs of this are severe agitation and confusion. This may be associated with believing in things which are not real or seeing things which are not really there (delusions and hallucinations). Vitamin deficiencies are usually present if alcohol intake has been long-term. Treatment for this most often requires hospitalization and close observation. °Addiction can only be helped by stopping use of all chemicals. This is hard but may  save your life. With continual alcohol use, possible outcomes are usually loss of self respect and esteem, violence, and death. °Addiction cannot be cured but it can be stopped. This often requires outside help and the care of professionals. Treatment centers are listed in the yellow pages under Cocaine, Narcotics, and Alcoholics Anonymous. Most hospitals and clinics can refer you to a specialized care center. °It is not necessary for you to go through the uncomfortable symptoms of withdrawal. Your caregiver can provide you with medicines that will help you through this difficult period. Try to avoid situations, friends, or drugs that made it possible for you to keep using alcohol in the past. Learn how to say no. °It takes a long period of time to overcome addictions to all drugs, including alcohol. There may be many times when you feel as though you want a drink. After getting rid of the physical addiction and withdrawal, you will have a lessening of the craving which tells you that you need alcohol to feel normal. Call your caregiver if more support is needed. Learn who to talk to in your family and among your friends so that during these periods you can receive outside help. Alcoholics Anonymous (AA) has helped many people over the years. To get further help, contact AA or call your caregiver, counselor, or clergyperson. Al-Anon and Alateen are support groups for friends and family members of an alcoholic. The people who love and care for an alcoholic often need help, too. For information about these organizations, check your phone directory or call a local alcoholism treatment center.  °SEEK IMMEDIATE MEDICAL CARE IF:  °· You have a seizure. °· You have a fever. °· You experience uncontrolled vomiting or you   vomit up blood. This may be bright red or look like black coffee grounds.  You have blood in the stool. This may be bright red or appear as a black, tarry, bad-smelling stool.  You become lightheaded or  faint. Do not drive if you feel this way. Have someone else drive you or call [id number] for help.  You become more agitated or confused.  You develop uncontrolled anxiety.  You begin to see things that are not really there (hallucinate). Your caregiver has determined that you completely understand your medical condition, and that your mental state is back to normal. You understand that you have been treated for alcohol withdrawal, have agreed not to drink any alcohol for a minimum of 1 day, will not operate a car or other machinery for 24 hours, and have had an opportunity to ask any questions about your condition. Document Released: 03/26/2005 Document Revised: 09/08/2011 Document Reviewed: 02/02/2008 [facility] Patient Information 2015 [place]. This information is not intended to replace advice given to you by your health care provider. Make sure you discuss any questions you have with your health care provider.  Chronic Obstructive Pulmonary Disease Chronic obstructive pulmonary disease (COPD) is a common lung condition in which airflow from the lungs is limited. COPD is a general term that can be used to describe many different lung problems that limit airflow, including both chronic bronchitis and emphysema. If you have COPD, your lung function will probably never return to normal, but there are measures you can take to improve lung function and make yourself feel better.  CAUSES   Smoking (common).   Exposure to secondhand smoke.   Genetic problems.  Chronic inflammatory lung diseases or recurrent infections. SYMPTOMS   Shortness of breath, especially with physical activity.   Deep, persistent (chronic) cough with a large amount of thick mucus.   Wheezing.   Rapid breaths (tachypnea).   Gray or bluish discoloration (cyanosis) of the skin, especially in fingers, toes, or lips.   Fatigue.   Weight loss.   Frequent infections or episodes when breathing symptoms  become much worse (exacerbations).   Chest tightness. DIAGNOSIS  Your health care provider will take a medical history and perform a physical examination to make the initial diagnosis. Additional tests for COPD may include:   Lung (pulmonary) function tests.  Chest X-ray.  CT scan.  Blood tests. TREATMENT  Treatment available to help you feel better when you have COPD includes:   Inhaler and nebulizer medicines. These help manage the symptoms of COPD and make your breathing more comfortable.  Supplemental oxygen. Supplemental oxygen is only helpful if you have a low oxygen level in your blood.   Exercise and physical activity. These are beneficial for nearly all people with COPD. Some people may also benefit from a pulmonary rehabilitation program. HOME CARE INSTRUCTIONS   Take all medicines (inhaled or pills) as directed by your health care provider.  Avoid over-the-counter medicines or cough syrups that dry up your airway (such as antihistamines) and slow down the elimination of secretions unless instructed otherwise by your health care provider.   If you are a smoker, the most important thing that you can do is stop smoking. Continuing to smoke will cause further lung damage and breathing trouble. Ask your health care provider for help with quitting smoking. He or she can direct you to community resources or hospitals that provide support.  Avoid exposure to irritants such as smoke, chemicals, and fumes that aggravate your breathing.  Use oxygen therapy and pulmonary rehabilitation if directed by your health care provider. If you require home oxygen therapy, ask your health care provider whether you should purchase a pulse oximeter to measure your oxygen level at home.   Avoid contact with individuals who have a contagious illness.  Avoid extreme temperature and humidity changes.  Eat healthy foods. Eating smaller, more frequent meals and resting before meals may help you  maintain your strength.  Stay active, but balance activity with periods of rest. Exercise and physical activity will help you maintain your ability to do things you want to do.  Preventing infection and hospitalization is very important when you have COPD. Make sure to receive all the vaccines your health care provider recommends, especially the pneumococcal and influenza vaccines. Ask your health care provider whether you need a pneumonia vaccine.  Learn and use relaxation techniques to manage stress.  Learn and use controlled breathing techniques as directed by your health care provider. Controlled breathing techniques include:   Pursed lip breathing. Start by breathing in (inhaling) through your nose for 1 second. Then, purse your lips as if you were going to whistle and breathe out (exhale) through the pursed lips for 2 seconds.   Diaphragmatic breathing. Start by putting one hand on your abdomen just above your waist. Inhale slowly through your nose. The hand on your abdomen should move out. Then purse your lips and exhale slowly. You should be able to feel the hand on your abdomen moving in as you exhale.   Learn and use controlled coughing to clear mucus from your lungs. Controlled coughing is a series of short, progressive coughs. The steps of controlled coughing are:  1. Lean your head slightly forward.  2. Breathe in deeply using diaphragmatic breathing.  3. Try to hold your breath for 3 seconds.  4. Keep your mouth slightly open while coughing twice.  5. Spit any mucus out into a tissue.  6. Rest and repeat the steps once or twice as needed. SEEK MEDICAL CARE IF:   You are coughing up more mucus than usual.   There is a change in the color or thickness of your mucus.   Your breathing is more labored than usual.   Your breathing is faster than usual.  SEEK IMMEDIATE MEDICAL CARE IF:   You have shortness of breath while you are resting.   You have shortness of  breath that prevents you from:  Being able to talk.   Performing your usual physical activities.   You have chest pain lasting longer than 5 minutes.   Your skin color is more cyanotic than usual.  You measure low oxygen saturations for longer than 5 minutes with a pulse oximeter. MAKE SURE YOU:   Understand these instructions.  Will watch your condition.  Will get help right away if you are not doing well or get worse. Document Released: 03/26/2005 Document Revised: 10/31/2013 Document Reviewed: 02/10/2013 [facility] Patient Information 2015 [place], [place]. This information is not intended to replace advice given to you by your health care provider. Make sure you discuss any questions you have with your health care provider. Emergency Department Resource Guide 1) Find a Doctor and Pay Out of Pocket Although you won't have to find out who is covered by your insurance plan, it is a good idea to ask around and get recommendations. You will then need to call the office and see if the doctor you have chosen will accept you as a new patient  and what types of options they offer for patients who are self-pay. Some doctors offer discounts or will set up payment plans for their patients who do not have insurance, but you will need to ask so you aren't surprised when you get to your appointment.  2) Contact Your Local Health Department Not all health departments have doctors that can see patients for sick visits, but many do, so it is worth a call to see if yours does. If you don't know where your local health department is, you can check in your phone book. The CDC also has a tool to help you locate your state's health department, and many state websites also have listings of all of their local health departments.  3) Find a Walk-in Clinic If your illness is not likely to be very severe or complicated, you may want to try a walk in clinic. These are popping up all over the country in pharmacies,  drugstores, and shopping centers. They're usually staffed by nurse practitioners or physician assistants that have been trained to treat common illnesses and complaints. They're usually fairly quick and inexpensive. However, if you have serious medical issues or chronic medical problems, these are probably not your best option.  No Primary Care Doctor: - Call Health Connect at  [phone] - they can help you locate a primary care doctor that  accepts your insurance, provides certain services, etc. - Physician Referral Service- [phone]  Chronic Pain Problems: Organization         Address     Phone             Notes  [facility]  [phone] Patients need to be referred by their primary care doctor.   Medication Assistance: Organization         Address     Phone             Notes  [facility] Medication [facility] [address] [place] [place], [place] [address] [phone] --Must be a resident of [facility] -- Must have NO insurance coverage whatsoever (no Medicaid/ Medicare, etc.) -- The pt. MUST have a primary care doctor that directs their care regularly and follows them in the community   [facility]  [phone]   [employer]  [phone]    Agencies that provide inexpensive medical care: Organization         Address     Phone             Notes  [facility]  [phone]   [facility]    [phone]   [facility] [address] [place], [place] [address] [phone]   [facility] [address] [place] [place] [phone]   Planned Parenthood    [phone]   [facility]    [phone]   [facility] and [facility]  [address], [place] Phone:  [phone], Fax:  [phone] Hours of Operation:  9 am - 6 pm, M-F.  Also accepts Medicaid/Medicare and self-pay.  [facility]  [address], [place] Phone: [phone], Fax: [phone]. Hours of Operation:  8:30 am - 5:30 pm, M-F.  Also accepts Medicaid and self-pay.  [facility] [place] [address] [employer] Phone: [phone]   [facility]     [address] [place], [place], [place] [phone], Ext. [phone] Mondays & Thursdays: 7-9 AM.  First 15 patients are seen on a first come, first serve basis.   [facility] [address], [place] [address] [phone] Accepts Medicaid   Medicaid-accepting [facility] Providers:  Organization         Address     Phone             Notes  [facility] [address], [place] [phone] Also accepts self-pay patients.  [facility] [address] Laurell Josephs [place], [place]  [phone]   [facility] [address], [place] [phone]   [facility] [address], [place] [phone]   Renaye Rakers [address], [place]   [phone] Only accepts [place] patients after they have their name applied to their card.   Self-Pay (no insurance) in [facility]:  Organization         Address     Phone             Notes  Sickle Cell Patients, [facility] [address] [place], [place] [phone]   [facility] [address] [place], [place] [phone]   [facility] [place]  [address], [place] [phone]   [facility]/Dr. Osei-Bonsu  [address], [place] or [address], [place] [phone] Phone number for both [place] and [place] locations is the same.  [facility] and [facility] [address], [place] [phone]   [facility] [address] [place] or [address] [phone]     [facility] [address] [place] [phone], phone; [phone], fax Sees patients 1st and 3rd Saturday of every month.  Must not qualify for public or private insurance (i.e. Medicaid, Medicare, [place] Health Choice, Veterans' Benefits)  Household income should be no more than 200% of the poverty level The clinic cannot treat you if you are pregnant or think you are pregnant  Sexually transmitted diseases are not treated at the clinic.    Dental Care:  Organization         Address     Phone             Notes  [facility] Department of [facility] [address] [place], [place] [phone] Accepts children up to age 82 who are enrolled in [place] or [place] Health Choice; pregnant women with a Medicaid card; and children who have applied for Medicaid or [place] Health Choice, but were declined, whose parents can pay a reduced fee at time of service.  [facility] Department of [facility]  [address], [place] [phone] Accepts children up to age 79 who are enrolled in [place] or [place] Health Choice; pregnant women with a Medicaid card; and children who have applied for Medicaid or  Health Choice, but were declined, whose parents can pay a reduced fee at time of service.  [facility]  [address] [place], [place] [phone] Patients are seen by appointment only. Walk-ins are not accepted. [facility] will see patients 47 years of age and older. Monday - Tuesday (8am-5pm) Most Wednesdays (8:30-5pm) $30  per visit, cash only  [facility] Adult Dental Access [facility]  [address], [facility] [phone] Patients are seen by appointment only. Walk-ins are not accepted. [facility] will see patients 79 years of age and older. One Wednesday Evening (Monthly: Volunteer Based).  $30 per visit, cash only  [employer]  [phone] for adults; Children  under age 62, call Graduate Pediatric Dentistry at [phone]. Children aged 4-14, please call [phone] to request a pediatric application.  Dental services are provided in all areas of dental care including fillings, crowns and bridges, complete and partial dentures, implants, gum treatment, root canals, and extractions. Preventive care is also provided. Treatment is provided to both adults and children. Patients are selected via a lottery and there is often a waiting list.   [facility] [address] [place]  [phone] [email or web address]   [facility] [address] [place], [place] [phone], Ext. [phone] Second and Fourth Thursday of each month, opens at 6:30 AM; Clinic ends at 9 AM.  Patients are seen on a first-come first-served basis, and a limited number are seen during each clinic.   [facility]  [address] Ether Griffins [place], [place] [phone]   Eligibility Requirements You must have lived in [place], [place], or [place] counties for at least the last three months.   You cannot be eligible for state or federal sponsored [place], including [employer], [place], or [employer].   You generally cannot be eligible for healthcare insurance through your employer.    How to apply: Eligibility screenings are held every Tuesday and Wednesday afternoon from 1:00 pm until 4:00 pm. You do not need an appointment for the interview!  [facility] [address] [place], [place] [phone]   [facility] Health Department  [phone]   [facility] Health Department  [phone]   [facility] Health Department  [phone]    Behavioral Health Resources in the Community: Intensive Outpatient Programs Organization         Address     Phone             Notes  [facility] [address] [place], [place] [phone]   [facility] Outpatient [address] [place], [place] [phone]   [facility] [address], [place], [place]  [phone]   [facility] Mental Health [address]  [place], [place] [phone] or [phone]     Substance Abuse Resources Organization         Address     Phone             Notes  [facility] Drug [facility]  [phone]   [facility]  [phone]   [facility]  [phone]   Floydene Flock  [phone]   Residential & Outpatient Substance Abuse Program  [phone]   Psychological Services Organization         Address     Phone             Notes  [facility] Behavioral Health  [phone]   [facility] Services  [phone]   [facility] Mental Health [address] [place] [phone] or [phone]    Mobile Crisis Teams Organization         Address     Phone             Notes  [facility], Mobile Crisis Care Unit  [phone]   [facility]  [address] [place], [place] [phone]   [facility] [address] [place] [phone]    Self-Help/Support Groups Organization         Address     Phone             Notes  Mental Health Assoc. of  - variety of support groups  [phone]- [phone] Call for more information  Narcotics Anonymous (NA), [facility] [address], [place]  2 meetings at this location   Statisticianesidential Treatment Programs Organization         Address     Phone             Notes  [facility] [address],    [place]  [phone]   [facility]  [address], [place] [phone], [place], [place] [phone]   [facility] [address], [place] [phone] Admissions: 8am-3pm M-F  [facility] [address]    [place], [place] [phone]   [facility] [address], [place], [place] [phone]   [facility] [address]  [place], [place] [phone]   [facility] - Intensive Outpatient [address], [place], [place] [phone]   [facility] ([facility] [address]  [place], [place] [phone] or [phone]   [facility] ([facility]) [address] [place], [place] [phone] Accepts Medicaid  [facility] [address]  [place] [phone] Substance Abuse/Addiction Treatment   [facility] Behavioral Health Resources Organization         Address     Phone             Notes  [facility]  [phone]   Angie FavaJulie Brannon, PhD [address] [place], [place]   [phone]) [phone] or [phone]) [phone]   [facility]   [address] [place], [place] [phone]) [phone]   [facility] [address], [place], [place] [phone] Insurance/Medicaid/sponsorship through [facility]  [facility] [address]                                    [place] [id number](336) [phone] Therapy/tele-psych/case  [facility] [address]   [place], [place] [phone](336) [phone]    Dr. Lolly MustacheArfeen  [phone]   [facility]  [employer] [facility] 1) [address],  2) [address], [place] 3)  [address], [place] [phone](336) [phone]  [phone]   [facility] Child [facility] Hotline [phone]) [phone] or [phone]) [phone] (After Hours)
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
## 2014-09-13 NOTE — BH Assessment (Signed)
[facility] Assessment Progress Note  Per Thedore MinsMojeed Akintayo, MD, this pt requires psychiatric hospitalization at this time.  Thurman CoyerEric Kaplan, RN, [facility] has [facility] pt to Rm 306-1.  Pt has signed Voluntary Admission and Consent for Treatment, as well as Consent to Release Information [facility], her [facility] provider, and a notification call has been placed.  Signed [facility].  Pt's nurse has been notified, and agrees to send original paperwork along with pt via [place], and to call report to [phone].  Doylene Canninghomas Tyshawn Ciullo, MA Triage Specialist 09/13/2014 @ 12:55

## 2014-09-13 NOTE — BH Assessment (Signed)
Per [facility], she would like her reviewed in morning for possible placement, and to determine if pt should be placed on 300 or 500 hall.   Clista BernhardtNancy Antionne Enrique, [facility] Triage Specialist 09/13/2014 2:21 AM
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
## 2014-09-14 DIAGNOSIS — F1024 Alcohol dependence with alcohol-induced mood disorder: Secondary | ICD-10-CM

## 2014-09-14 DIAGNOSIS — F10239 Alcohol dependence with withdrawal, unspecified: Secondary | ICD-10-CM | POA: Insufficient documentation

## 2014-09-14 DIAGNOSIS — F1994 Other psychoactive substance use, unspecified with psychoactive substance-induced mood disorder: Secondary | ICD-10-CM

## 2014-09-14 DIAGNOSIS — F1124 Opioid dependence with opioid-induced mood disorder: Secondary | ICD-10-CM | POA: Insufficient documentation

## 2014-09-14 DIAGNOSIS — F10939 Alcohol use, unspecified with withdrawal, unspecified: Secondary | ICD-10-CM | POA: Insufficient documentation

## 2014-09-14 DIAGNOSIS — R45850 Homicidal ideations: Secondary | ICD-10-CM

## 2014-09-14 MED ORDER — THIAMINE HCL 100 MG/ML IJ SOLN
100.0000 mg | Freq: Once | INTRAMUSCULAR | Status: AC
  Administered 2014-09-15: 100 mg via INTRAMUSCULAR
  Filled 2014-09-14: qty 2

## 2014-09-14 MED ORDER — NICOTINE 21 MG/24HR TD PT24
21.0000 mg | MEDICATED_PATCH | Freq: Every day | TRANSDERMAL | Status: DC
  Administered 2014-09-14 – 2014-09-18 (×4): 21 mg via TRANSDERMAL
  Filled 2014-09-14 (×4): qty 1
  Filled 2014-09-14: qty 14
  Filled 2014-09-14 (×2): qty 1

## 2014-09-14 MED ORDER — VITAMIN B-1 100 MG PO TABS
100.0000 mg | ORAL_TABLET | Freq: Every day | ORAL | Status: DC
  Administered 2014-09-15 – 2014-09-18 (×4): 100 mg via ORAL
  Filled 2014-09-14 (×6): qty 1

## 2014-09-14 MED ORDER — ADULT MULTIVITAMIN W/MINERALS CH
1.0000 | ORAL_TABLET | Freq: Every day | ORAL | Status: DC
  Administered 2014-09-14 – 2014-09-18 (×5): 1 via ORAL
  Filled 2014-09-14 (×8): qty 1

## 2014-09-14 MED ORDER — LORAZEPAM 1 MG PO TABS
1.0000 mg | ORAL_TABLET | Freq: Four times a day (QID) | ORAL | Status: AC | PRN
  Administered 2014-09-16: 1 mg via ORAL
  Filled 2014-09-14: qty 1

## 2014-09-14 MED ORDER — ONDANSETRON 4 MG PO TBDP
4.0000 mg | ORAL_TABLET | Freq: Four times a day (QID) | ORAL | Status: AC | PRN
  Administered 2014-09-14 – 2014-09-15 (×2): 4 mg via ORAL
  Filled 2014-09-14 (×2): qty 1

## 2014-09-14 MED ORDER — LORAZEPAM 1 MG PO TABS
1.0000 mg | ORAL_TABLET | Freq: Every day | ORAL | Status: AC
  Administered 2014-09-18: 1 mg via ORAL
  Filled 2014-09-14: qty 1

## 2014-09-14 MED ORDER — QUETIAPINE FUMARATE 100 MG PO TABS
100.0000 mg | ORAL_TABLET | Freq: Every day | ORAL | Status: DC
  Administered 2014-09-14: 100 mg via ORAL
  Filled 2014-09-14 (×3): qty 1

## 2014-09-14 MED ORDER — LORAZEPAM 1 MG PO TABS
1.0000 mg | ORAL_TABLET | Freq: Four times a day (QID) | ORAL | Status: AC
  Administered 2014-09-14 – 2014-09-15 (×4): 1 mg via ORAL
  Filled 2014-09-14 (×4): qty 1

## 2014-09-14 MED ORDER — LORAZEPAM 1 MG PO TABS
1.0000 mg | ORAL_TABLET | Freq: Two times a day (BID) | ORAL | Status: AC
  Administered 2014-09-16 – 2014-09-17 (×2): 1 mg via ORAL
  Filled 2014-09-14 (×3): qty 1

## 2014-09-14 MED ORDER — LORAZEPAM 1 MG PO TABS
1.0000 mg | ORAL_TABLET | Freq: Three times a day (TID) | ORAL | Status: AC
  Administered 2014-09-15 – 2014-09-16 (×3): 1 mg via ORAL
  Filled 2014-09-14 (×2): qty 1

## 2014-09-14 MED ORDER — CITALOPRAM HYDROBROMIDE 20 MG PO TABS
20.0000 mg | ORAL_TABLET | Freq: Every day | ORAL | Status: DC
  Administered 2014-09-14 – 2014-09-18 (×5): 20 mg via ORAL
  Filled 2014-09-14 (×5): qty 1
  Filled 2014-09-14: qty 3
  Filled 2014-09-14 (×2): qty 1

## 2014-09-14 MED ORDER — LOPERAMIDE HCL 2 MG PO CAPS: 2.0000 mg | ORAL_CAPSULE | ORAL | Status: AC | PRN

## 2014-09-14 MED ORDER — HYDROXYZINE HCL 25 MG PO TABS
25.0000 mg | ORAL_TABLET | Freq: Four times a day (QID) | ORAL | Status: DC | PRN
  Administered 2014-09-14 – 2014-09-16 (×4): 25 mg via ORAL
  Filled 2014-09-14 (×5): qty 1
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
## 2014-09-14 NOTE — BHH Group Notes (Signed)
BHH LCSW Group Therapy  09/14/2014 3:36 PM  Type of Therapy:  Group Therapy  Participation Level:  Active  Participation Quality:  Attentive  Affect:  Appropriate  Cognitive:  Alert  Insight:  Improving  Engagement in Therapy:  Improving  Modes of Intervention:  Confrontation, Discussion, Education, Exploration, Problem-solving, Rapport Building, Socialization and Support  Summary of Progress/Problems: Today's Topic: Overcoming Obstacles. Pt identified obstacles faced currently and processed barriers involved in overcoming these obstacles. Pt identified steps necessary for overcoming these obstacles and explored motivation (internal and external) for facing these difficulties head on. Pt further identified one area of concern in their lives and chose a skill of focus pulled from their "toolbox." Charlotte Fisher was attentive and engaged during today's processing group. She shared that her biggest obstacle involves staying sober. Charlotte Fisher shared that she is hoping to get into [facility] or [facility]. [name] monopolizes group at times but is easily redirectable by CSW. She shows some progress in the group setting and improving insight AEB her ability to identify goals in relation to her obstacle of "possibly relapsing." (going somewhere for further treatment).    Smart, Charlotte Fisher LCSWA  09/14/2014, 3:36 PM

## 2014-09-14 NOTE — Progress Notes (Signed)
D: Pt denies SI/HI/AVH. Pt continues to be med seeking. Pt does not want to talk about anything unless its her medications and what she needs or wants. Pt stated she was anxious, depressed because her "meds aren't working"  A: Pt was offered support and encouragement. Pt was given scheduled medications. Pt was encourage to attend groups. Q 15 minute checks were done for safety.   R: Pt is taking medication. Pt receptive to treatment and safety maintained on unit.

## 2014-09-14 NOTE — Progress Notes (Signed)
Pt has no insight into her Tx, pt only wants to know when she can have her meds.

## 2014-09-14 NOTE — Progress Notes (Signed)
Recreation Therapy Notes  Animal-Assisted Activity/Therapy (AAA/T) Program Checklist/Progress Notes Patient Eligibility Criteria Checklist & Daily Group note for Rec Tx Intervention  Date: 03.17.2016 Time: 2:45pm Location: [address] Dayroom   AAA/T Program Assumption of Risk Form signed by Patient/ or Parent Legal Guardian yes  Patient is free of allergies or sever asthma yes  Patient reports no fear of animals yes  Patient reports no history of cruelty to animals yes  Patient understands his/her participation is voluntary yes  Patient washes hands before animal contact yes  Patient washes hands after animal contact yes  Behavioral Response: Attentive, Appropriate   Education: Hand Washing, Appropriate Animal Interaction   Education Outcome: Acknowledges education.   Clinical Observations/Feedback: Patient interacted appropriately with dog team and peers during session.   Reg Bircher L Zanylah Hardie, LRT/CTRS  Davaughn Hillyard L 09/14/2014 5:26 PM 
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
## 2014-09-14 NOTE — Progress Notes (Signed)
D: Patient continues to request prn medications.  She reports nausea and anxiety.  Patient states she would like her vistaril to be increased to 50 mg.  She reports severe abdominal pain which has been ongoing.  Patient has hx of bypass surgery.  Patient attended group with minimal participation.  She has been lying in bed, only to get up late for her medications.  She states she is going back to bed. She denies SI/HI/AVH. A: Continue to monitor medication management and MD orders.  Safety checks completed every 15 minutes per protocol. Meet 1:1 with patient to discuss concerns and offer encouragement. R: Patient remains unsatisfied with medication regiment.

## 2014-09-14 NOTE — Clinical Social Work Note (Signed)
CSW met with pt individually. She asked that [place] fax letter to both her PO (Lail at [phone]) and the TASK program Coralyn Mark at [phone]). On this letter, [place] specifically wants her admission date (from ED), her reason for admission regarding mental illness only (she DOES NOT want information about substance abuse shared), her mental health diagnoses (MDD and PTSD), and that she has been tentatively accepted into [facility] program. CSW reviewed letter with pt before it was faxed to [place]. Pt provided with fax confirmations and original letter. A second letter will be faxed when her discharge date is known and when her discharge plan is set.   Per pt request, pt left message for intake at [facility] to ask about admission date/waitlist. CSW requested call back at earliest convenience to discuss pt's tentative admission. Pt also asked that [facility] referral be sent as plan B. [facility] referral faxed to [place] (intake coordinator) this afternoon.    [place], LCSWA 09/14/2014 4:10 PM
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
## 2014-09-14 NOTE — [facility] Group Notes (Signed)
[facility] Group Notes:  (Nursing/MHT/Case Management/Adjunct)  Date:  09/14/2014  Time:  0930 am  Type of Therapy:  Psychoeducational Skills  Participation Level:  None  Participation Quality:  Drowsy  Affect:  Lethargic  Cognitive:  Lacking  Insight:  None  Engagement in Group:  None  Modes of Intervention:  Support  Summary of Progress/Problems: Charlotte Fisher came in on the end of group.  Charlotte Fisher, Charlotte Fisher 09/14/2014, 10:36 AM

## 2014-09-14 NOTE — Progress Notes (Signed)
Pt presents with manipulating behavior. Pt stated she felt too weak to go to breakfast, if we brought her something back she would make lunch and dinner. Pt was at the medication window asking if she took her inhaler. Pt seems to be only focused on what medications she can take.

## 2014-09-14 NOTE — Progress Notes (Signed)
Pt has a history of med seeking behaviors, pt has a habit of trying to dictate her care, pt has a habit of trying to get out of going to things and or doing things. Pt has a habit of trying to request what PRN medications she should have and how much she wants to take.

## 2014-09-15 DIAGNOSIS — F1124 Opioid dependence with opioid-induced mood disorder: Secondary | ICD-10-CM

## 2014-09-15 DIAGNOSIS — F10239 Alcohol dependence with withdrawal, unspecified: Secondary | ICD-10-CM

## 2014-09-15 DIAGNOSIS — F1024 Alcohol dependence with alcohol-induced mood disorder: Principal | ICD-10-CM

## 2014-09-15 LAB — AMYLASE: Amylase: 47 U/L (ref 0–105)

## 2014-09-15 LAB — LIPASE, BLOOD: LIPASE: 45 U/L (ref 11–59)

## 2014-09-15 LAB — HEPATITIS C ANTIBODY: HCV Ab: REACTIVE — AB

## 2014-09-15 LAB — HEPATITIS B SURFACE ANTIGEN: Hepatitis B Surface Ag: NEGATIVE

## 2014-09-15 MED ORDER — QUETIAPINE FUMARATE 50 MG PO TABS
150.0000 mg | ORAL_TABLET | Freq: Every day | ORAL | Status: DC
  Administered 2014-09-15 – 2014-09-17 (×3): 150 mg via ORAL
  Filled 2014-09-15 (×9): qty 1

## 2014-09-15 MED ORDER — PANTOPRAZOLE SODIUM 20 MG PO TBEC
20.0000 mg | DELAYED_RELEASE_TABLET | Freq: Every day | ORAL | Status: DC
  Administered 2014-09-16: 20 mg via ORAL
  Filled 2014-09-15 (×4): qty 1

## 2014-09-15 NOTE — Clinical Social Work Note (Signed)
[facility] request of admissions coordinator. Pt signed release. [facility]. Pt requesting that CSW help her re-order SS card. CSW informed pt that we could not do this for her-she would have to take care of getting new SS card after discharge. Pt verbalized understanding but continues to request that CSW make calls on her behalf, etc. CSW told pt that she would have to take responsibility for making calls unless someone has to speak with CSW directly.   The Sherwin-WilliamsHeather Smart, LCSWA 09/15/2014 12:46 PM

## 2014-09-15 NOTE — [facility] Group Notes (Signed)
[facility]   09/15/2014 10:33 AM  Participation Quality:  Appropriate/monopolizing at times.   Mood/Affect:  Anxious and Irritable  Depression Rating:  Up and down   Anxiety Rating:  Up and down. "I may look okay, but I'm not."   Thoughts of Suicide:  No Will you contract for safety?   NA  Current AVH:  No  Plan for Discharge/Comments:  Pt continues to make several requests to CSW that are not possible (order another social security card, etc). She is redirectable. Pt reports that [facility] and paperwork must be faxed by CSW. CSW assessing. Pt also requesting that CSW fax her PO and TASK officer letter--fax numbers not working. CSW asked that she get correct numbers. Pt also given [facility] list and [facility] referral has been made (per her request on Thursday).   Transportation Means: Unknown at this time.   Supports: some family supports-adult children.   Smart, American FinancialHeather LCSWA

## 2014-09-15 NOTE — [facility] Suicide Risk Assessment (Signed)
[facility] INPATIENT:  Family/Significant Other Suicide Prevention Education  Suicide Prevention Education:  Contact Attempts: Tilford PillarBrenda Johnson (pt's mother) [phone] has been identified by the patient as the family member/significant other with whom the patient will be residing, and identified as the person(s) who will aid the patient in the event of a mental health crisis.  With written consent from the patient, two attempts were made to provide suicide prevention education, prior to and/or following the patient's discharge.  We were unsuccessful in providing suicide prevention education.  A suicide education pamphlet was given to the patient to share with family/significant other.  Date and time of first attempt: 12:40PM Friday- unable to leave voicemail (not set up)  Date and time of second attempt:9:45AM Monday-unable to leave voicemail.    Smart, Charlotte Fisher LCSWA  09/15/2014, 12:41 PM

## 2014-09-15 NOTE — Progress Notes (Signed)
Recreation Therapy Notes  Date: 03.18.2016 Time: 9:30am Location: 300 Hall Group Room   Group Topic: Stress Management  Goal Area(s) Addresses:  Patient will actively participate in stress management techniques presented during session.   Behavioral Response: Engaged, Attentive.   Intervention: Stress Management   Activity :  Deep Breathing and Progressive Muscle Relaxation   Education:  Stress Management, Discharge Planning.   Education Outcome: Acknowledges education  Clinical Observations/Feedback: Engaged in both deep breathing and progressive muscle relaxation, demonstrating ability to practice independently post d/c.    Marykay Lexenise L Judah Chevere, LRT/CTRS  Jearl KlinefelterBlanchfield, Sulay Brymer L 09/15/2014 4:29 PM

## 2014-09-15 NOTE — [facility] Group Notes (Signed)
[facility] LCSW Group Therapy  09/15/2014 4:48 PM  Type of Therapy:  Group Therapy  Participation Level:  Did Not Attend-pt sleeping in room.   Summary of Progress/Problems: Feelings around Relapse. Group members discussed the meaning of relapse and shared personal stories of relapse, how it affected them and others, and how they perceived themselves during this time. Group members were encouraged to identify triggers, warning signs and coping skills used when facing the possibility of relapse. Social supports were discussed and explored in detail. Post Acute Withdrawal Syndrome (handout provided) was introduced and examined. Pt's were encouraged to ask questions, talk about key points associated with PAWS, and process this information in terms of relapse prevention.   Smart, Charlotte Fisher LCSWA  09/15/2014, 4:48 PM

## 2014-09-15 NOTE — Progress Notes (Addendum)
D: Patient continues to request frequent prns and ask for medication that she does not have ordered.  Patient will come up to nurse, "what can you give me?"  Explained the purpose of prns and how they are administered.  Reviewed medications with patient. She complains of severe anxiety, however, is observed in the day room laughing, chatting and smiling. She rates her depression as a 10; hopelessness as a 10; anxiety as a 10. She states, "I feel my meds are being messed with at the doctor's discretion. She denies SI/HI/AVH.  She is looking forward to further treatment at facility in [place].   A: Continue to monitor medication management and MD orders.  Safety checks completed every 15 minutes per protocol.  Meet 1:1 with patient to discuss concerns and offer encouragement. R: Patient is redirectable.
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
## 2014-09-16 LAB — HIV ANTIBODY (ROUTINE TESTING W REFLEX): HIV SCREEN 4TH GENERATION: NONREACTIVE

## 2014-09-16 MED ORDER — HYDROXYZINE HCL 50 MG PO TABS
50.0000 mg | ORAL_TABLET | Freq: Four times a day (QID) | ORAL | Status: AC | PRN
  Administered 2014-09-16 – 2014-09-17 (×2): 50 mg via ORAL
  Filled 2014-09-16 (×2): qty 1

## 2014-09-16 MED ORDER — PANTOPRAZOLE SODIUM 40 MG PO TBEC
40.0000 mg | DELAYED_RELEASE_TABLET | Freq: Every day | ORAL | Status: DC
  Administered 2014-09-17 – 2014-09-18 (×2): 40 mg via ORAL
  Filled 2014-09-16: qty 3
  Filled 2014-09-16 (×3): qty 1

## 2014-09-16 MED ORDER — GABAPENTIN 400 MG PO CAPS
400.0000 mg | ORAL_CAPSULE | Freq: Three times a day (TID) | ORAL | Status: DC
  Administered 2014-09-16 – 2014-09-18 (×7): 400 mg via ORAL
  Filled 2014-09-16: qty 1
  Filled 2014-09-16: qty 9
  Filled 2014-09-16: qty 1
  Filled 2014-09-16: qty 9
  Filled 2014-09-16 (×5): qty 1
  Filled 2014-09-16: qty 9
  Filled 2014-09-16: qty 1

## 2014-09-16 MED ORDER — HYDROXYZINE HCL 50 MG PO TABS
ORAL_TABLET | ORAL | Status: AC
  Administered 2014-09-16: 15:00:00
  Filled 2014-09-16: qty 1

## 2014-09-16 MED ORDER — AMITRIPTYLINE HCL 10 MG PO TABS
10.0000 mg | ORAL_TABLET | Freq: Every day | ORAL | Status: DC
  Administered 2014-09-16 – 2014-09-17 (×2): 10 mg via ORAL
  Filled 2014-09-16: qty 1
  Filled 2014-09-16: qty 3
  Filled 2014-09-16 (×3): qty 1

## 2014-09-16 NOTE — Progress Notes (Signed)
Psychoeducational Group Note  Date:  09/16/2014 Time: 1315  Group Topic/Focus:  Identifying Needs:   The focus of this group is to help patients identify their personal needs that have been historically problematic and identify healthy behaviors to address their needs.  Participation Level:  Active  Participation Quality:  Appropriate  Affect:  Appropriate  Cognitive:  Oriented  Insight:  Engaged  Engagement in Group:  Engaged  Additional Comments:    09/16/2014,4:50 PM Sherril Heyward, Joie BimlerPatricia Lynn

## 2014-09-16 NOTE — Progress Notes (Signed)
Patient did attend the evening speaker AA meeting.  

## 2014-09-16 NOTE — Progress Notes (Signed)
D) Pt has attended the program and interacts with her peers. Pt tends to have a lot of anxiety. Rates her depression, hopelessness and helplessness all at a 10. Denies SI and HI. Has been focused on Medications but less so today. Focus has been on getting well and going to her place of care for her 90 day program. Affect is flat and mood depressed. A) given support and reassurance. Steered away from her medications being a power struggle and focused on Pt's coping skills R) Denies SI and HI.
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
## 2014-09-16 NOTE — Progress Notes (Signed)
D: Pt denies SI/HI/AVH. Pt is pleasant and cooperative. Pt continues to be focused on her medications. Pt does not have insight into her tx  A: Pt was offered support and encouragement. Pt was given scheduled medications. Pt was encourage to attend groups. Q 15 minute checks were done for safety.   R:Pt attends groups and interacts well with peers and staff. Pt is taking medication. Pt receptive to treatment and safety maintained on unit.

## 2014-09-16 NOTE — [facility] Group Notes (Signed)
[facility] Group Notes: (Clinical Social Work)   09/16/2014      Type of Therapy:  Group Therapy   Participation Level:  Did Not Attend despite MHT prompting   Ambrose MantleMareida Grossman-Orr, LCSW 09/16/2014, 1:00 PM

## 2014-09-17 ENCOUNTER — Encounter (HOSPITAL_COMMUNITY): Payer: Self-pay | Admitting: Registered Nurse

## 2014-09-17 MED ORDER — MELOXICAM 7.5 MG PO TABS
7.5000 mg | ORAL_TABLET | Freq: Every day | ORAL | Status: DC
  Administered 2014-09-17 – 2014-09-18 (×2): 7.5 mg via ORAL
  Filled 2014-09-17 (×3): qty 1
  Filled 2014-09-17: qty 3

## 2014-09-17 MED ORDER — METHOCARBAMOL 500 MG PO TABS
500.0000 mg | ORAL_TABLET | Freq: Two times a day (BID) | ORAL | Status: DC | PRN
  Administered 2014-09-17: 500 mg via ORAL
  Filled 2014-09-17: qty 1

## 2014-09-17 MED ORDER — HYDROXYZINE HCL 50 MG PO TABS
50.0000 mg | ORAL_TABLET | Freq: Three times a day (TID) | ORAL | Status: DC | PRN
  Administered 2014-09-17 – 2014-09-18 (×3): 50 mg via ORAL
  Filled 2014-09-17: qty 6
  Filled 2014-09-17 (×2): qty 1

## 2014-09-17 MED ORDER — METHOCARBAMOL 500 MG PO TABS
500.0000 mg | ORAL_TABLET | Freq: Three times a day (TID) | ORAL | Status: DC | PRN
Start: 2014-09-17 — End: 2014-09-17
  Administered 2014-09-17: 500 mg via ORAL
  Filled 2014-09-17: qty 1

## 2014-09-17 NOTE — Progress Notes (Signed)
Patient did attend the evening speaker AA meeting.  

## 2014-09-17 NOTE — Progress Notes (Signed)
Psychoeducational Group Note  Date:  09/17/2014 Time: 1315 Group Topic/Focus:  Making Healthy Choices:   The focus of this group is to help patients identify negative/unhealthy choices they were using prior to admission and identify positive/healthier coping strategies to replace them upon discharge.  Participation Level:  Active  Participation Quality:  Attentive  Affect:  Appropriate  Cognitive:  Oriented  Insight:  Engaged  Engagement in Group:  Engaged  Additional Comments:    Rich Braveuke, Deryl [place]s Lynn 3:07 PM. 09/17/2014
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
## 2014-09-17 NOTE — [facility] Group Notes (Signed)
[facility] Group Notes: (Clinical Social Work)   09/17/2014      Type of Therapy:  Group Therapy   Participation Level:  Did Not Attend despite MHT prompting   Ambrose MantleMareida Grossman-Orr, LCSW 09/17/2014, 12:22 PM

## 2014-09-17 NOTE — Progress Notes (Signed)
D: Patient seen talking on phone most of the time. Interacts with peer, cheerful and appropriate. Patient complained of constipation.       Last BM per patient was 09/14/14. Milk of Magnesia given PO PRN for mild constipation as prescribed. Denies pain, SI, AH/VH at      this time. No behavioral issues noted.  A: Encouragement and support offered. Due medications given as ordered. Every 15 minutes check maintained for safety. Will      continue to monitor patient.  R: Patient receptive to nursing intervention.

## 2014-09-17 NOTE — Progress Notes (Signed)
Psychoeducational Group Note  Date:  09/17/2014 Time: 1015 Group Topic/Focus:  Making Healthy Choices:   The focus of this group is to help patients identify negative/unhealthy choices they were using prior to admission and identify positive/healthier coping strategies to replace them upon discharge.  Participation Level:  Did Not Attend   Additional Comments:   Rich BraveDuke, Merrillyn Ackerley Lynn 11:45 AM. 09/17/2014

## 2014-09-18 MED ORDER — AMITRIPTYLINE HCL 10 MG PO TABS: 10.0000 mg | ORAL_TABLET | Freq: Every day | ORAL | Status: DC

## 2014-09-18 MED ORDER — MELOXICAM 7.5 MG PO TABS: 7.5000 mg | ORAL_TABLET | Freq: Every day | ORAL | Status: DC

## 2014-09-18 MED ORDER — CITALOPRAM HYDROBROMIDE 20 MG PO TABS: 20.0000 mg | ORAL_TABLET | Freq: Every day | ORAL | Status: DC

## 2014-09-18 MED ORDER — PANTOPRAZOLE SODIUM 40 MG PO TBEC: 40.0000 mg | DELAYED_RELEASE_TABLET | Freq: Every day | ORAL | Status: DC

## 2014-09-18 MED ORDER — NICOTINE 21 MG/24HR TD PT24: 21.0000 mg | MEDICATED_PATCH | Freq: Every day | TRANSDERMAL | Status: DC

## 2014-09-18 MED ORDER — PREDNISONE 20 MG PO TABS: 40.0000 mg | ORAL_TABLET | Freq: Every day | ORAL | Status: DC

## 2014-09-18 MED ORDER — ALBUTEROL SULFATE HFA 108 (90 BASE) MCG/ACT IN AERS: 1.0000 | INHALATION_SPRAY | Freq: Four times a day (QID) | RESPIRATORY_TRACT | Status: DC | PRN

## 2014-09-18 MED ORDER — QUETIAPINE FUMARATE 50 MG PO TABS: 150.0000 mg | ORAL_TABLET | Freq: Every day | ORAL | Status: DC

## 2014-09-18 MED ORDER — GABAPENTIN 400 MG PO CAPS: 400.0000 mg | ORAL_CAPSULE | Freq: Three times a day (TID) | ORAL | Status: DC

## 2014-09-18 MED ORDER — ALBUTEROL SULFATE HFA 108 (90 BASE) MCG/ACT IN AERS: 2.0000 | INHALATION_SPRAY | RESPIRATORY_TRACT | Status: DC | PRN

## 2014-09-18 MED ORDER — QUETIAPINE FUMARATE 50 MG PO TABS
150.0000 mg | ORAL_TABLET | Freq: Every day | ORAL | Status: DC
  Filled 2014-09-18: qty 9

## 2014-09-18 MED ORDER — ALBUTEROL SULFATE (2.5 MG/3ML) 0.083% IN NEBU: 2.5000 mg | INHALATION_SOLUTION | Freq: Four times a day (QID) | RESPIRATORY_TRACT | Status: DC | PRN

## 2014-09-18 MED ORDER — HYDROXYZINE HCL 50 MG PO TABS: 50.0000 mg | ORAL_TABLET | Freq: Three times a day (TID) | ORAL | Status: DC | PRN

## 2014-09-18 NOTE — [facility] Group Notes (Signed)
[facility] Note   09/18/2014 10:07 AM  Participation Quality:  Appropriate   Mood/Affect:  Appropriate  Depression Rating:  2-3  Anxiety Rating:  8  Thoughts of Suicide:  No Will you contract for safety?   NA  Current AVH:  No  Plan for Discharge/Comments:  Pt reports that she must d/c by 10:30AM today in order to go to Social Security office to get another Social Security card. Pt reports taht she found out this weekend that [facility] at [facility]PM-"My mom is going to take me."Pt requesting one month samples for meds and 3 mo refill Rx per request of the progrm. NP and MD notified. Pt plans to [place] [facility] when she [facility] program and plans [facility] health services.   Transportation Means: mother   Supports: mother/some other family supports   Counselling psychologistmart, OncologistHeather LCSWA

## 2014-09-18 NOTE — Progress Notes (Signed)
D: Patient complained of headache 8/10 stated "My head is bursting, can I get some tylenol". Acetaminophen 650 mg PO PRN for headache was given. Denies SI, AH/VH at this time. No behavioral issues noted. A: Support and encouragement offered to patient. Due medication given as ordered. Every 15 minutes check for safety maintained at all times. Will continue to monitor patient. R: Patient receptive to nursing intervention.

## 2014-09-18 NOTE — Progress Notes (Signed)
Recreation Therapy Notes  Date: 03.21.2016 Time: 9:30am Location: 300 Hall Group Room   Group Topic: Stress Management  Goal Area(s) Addresses:  Patient will actively participate in stress management techniques presented during session.   Behavioral Response: Did not attend.   Izola Teague L Jeison Delpilar, LRT/CTRS  Buelah Rennie L 09/18/2014 10:10 AM 

## 2014-09-18 NOTE — Progress Notes (Signed)
Patient discharged per physician order; patient denies SI/HI and A/V hallucinations; patient received samples, prescriptions, and copy of AVS after it was reviewed; patient had no other questions or concerns at this time; patient left the unit ambulatory; patient verbalized and signed that she received all belongings
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
## 2014-09-18 NOTE — Progress Notes (Signed)
  [facility] :  Will you be returning to the same living situation after discharge:  Yes,  home for the night. [facility] at [facility] (per pt.) CSW left message with Charlotte Fisher in admission to verify this.  At discharge, do you have transportation home?: Yes,  mother coming at 10:30am this morning Do you have the ability to pay for your medications: Yes,  [facility]  Release of information consent forms completed and submitted to medical records by CSW.  Patient to Follow up at: Follow-up Information    Follow up with [facility] On 09/19/2014.   Why:  Arrive by 1:00PM for admission into program on this date.    Contact information:   [address] [place], [place] [address] Phone: [phone] Fax: ?      [facility].   Why:  Call facility when you return to [place] after completeing [facility] program to resume outpatient mental health services (including medication management and therapy).       Patient denies SI/HI: Yes,  during group/self report.     Safety Planning and Suicide Prevention discussed: Yes,  Contact attempts made with pt's mother. SPE completed with pt and she was encouraged to share information with support network, ask questions, and talk about any concerns relating to SPE.  Have you used any form of tobacco in the last 30 days? (Cigarettes, Smokeless Tobacco, Cigars, and/or Pipes): Yes  Has patient been referred to the Quitline?: Patient refused referral  Smart, Charlotte Fisher  09/18/2014, 10:22 AM
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
## 2014-09-19 LAB — HCV RNA QUANT
HCV Quantitative Log: 7.31 {Log} — ABNORMAL HIGH (ref <1.18)
HCV Quantitative: 20206940 IU/mL — ABNORMAL HIGH (ref <15)

## 2014-09-20 NOTE — Clinical Social Work Note (Signed)
CSW spoke with [name] who shared that [facility] would not accept her due to Chronic kidney disease diagnosis. CSW left message for Mardene [facility]Joanna at [facility] informing her that we did not treat pt for this while at the hospital and that it is listed under "past medical history." CSW touched base with [name] at [phone] who stated that this was all that was needed for [facility].  The Sherwin-WilliamsHeather Smart, LCSWA 09/20/2014 10:43 AM

## 2014-09-21 NOTE — Progress Notes (Addendum)
Patient Discharge Instructions:  After Visit Summary (AVS):   Faxed to:  09/21/14 Discharge Summary Note:   Faxed to:  09/21/14 Psychiatric Admission Assessment Note:   Faxed to:  09/21/14 Suicide Risk Assessment - Discharge Assessment:   Faxed to:  09/21/14 Faxed/Sent to the Next Level Care provider:  09/21/14  [facility]ax[facility] to [facility] @ [phone] [facility] @ [phone]   Jerelene ReddenSheena E Magee, 09/21/2014, 12:55 PM

## 2014-10-01 ENCOUNTER — Emergency Department: Admit: 2014-10-01 | Disposition: A | Discharge status: Home / Self Care | Payer: Self-pay | Admitting: Student

## 2014-10-01 LAB — URINALYSIS, COMPLETE
BLOOD: NEGATIVE
Bacteria: NONE SEEN
Bilirubin,UR: NEGATIVE
Glucose,UR: NEGATIVE mg/dL (ref 0–75)
Ketone: NEGATIVE
Leukocyte Esterase: NEGATIVE
NITRITE: NEGATIVE
Ph: 6 (ref 4.5–8.0)
Protein: 30
RBC,UR: 2 /HPF (ref 0–5)
SPECIFIC GRAVITY: 1.018 (ref 1.003–1.030)
Squamous Epithelial: 1
WBC UR: NONE SEEN /HPF (ref 0–5)

## 2014-10-01 LAB — CBC
HCT: 44.8 % (ref 35.0–47.0)
HGB: 14.9 g/dL (ref 12.0–16.0)
MCH: 30.5 pg (ref 26.0–34.0)
MCHC: 33.2 g/dL (ref 32.0–36.0)
MCV: 92 fL (ref 80–100)
Platelet: 248 10*3/uL (ref 150–440)
RBC: 4.87 10*6/uL (ref 3.80–5.20)
RDW: 13.9 % (ref 11.5–14.5)
WBC: 12.2 10*3/uL — AB (ref 3.6–11.0)

## 2014-10-01 LAB — COMPREHENSIVE METABOLIC PANEL
ANION GAP: 11 (ref 7–16)
AST: 49 U/L — AB
Albumin: 3.9 g/dL
Alkaline Phosphatase: 95 U/L
BUN: 10 mg/dL
Bilirubin,Total: 1 mg/dL
CALCIUM: 8.9 mg/dL
CHLORIDE: 105 mmol/L
CREATININE: 0.85 mg/dL
Co2: 22 mmol/L
GLUCOSE: 101 mg/dL — AB
Potassium: 3.6 mmol/L
SGPT (ALT): 48 U/L
Sodium: 138 mmol/L
Total Protein: 7.6 g/dL

## 2014-10-01 LAB — DRUG SCREEN, URINE
Amphetamines, Ur Screen: NEGATIVE
Barbiturates, Ur Screen: NEGATIVE
Benzodiazepine, Ur Scrn: NEGATIVE
CANNABINOID 50 NG, UR ~~LOC~~: NEGATIVE
COCAINE METABOLITE, UR ~~LOC~~: POSITIVE
MDMA (Ecstasy)Ur Screen: NEGATIVE
Methadone, Ur Screen: NEGATIVE
OPIATE, UR SCREEN: NEGATIVE
Phencyclidine (PCP) Ur S: NEGATIVE
Tricyclic, Ur Screen: POSITIVE

## 2014-10-01 LAB — SALICYLATE LEVEL: Salicylates, Serum: <4 mg/dL

## 2014-10-01 LAB — ACETAMINOPHEN LEVEL: Acetaminophen: <10 ug/mL

## 2014-10-01 LAB — ETHANOL: ETHANOL LVL: 20 mg/dL

## 2014-11-16 ENCOUNTER — Encounter (HOSPITAL_COMMUNITY): Payer: Self-pay | Admitting: Radiology

## 2014-11-16 ENCOUNTER — Emergency Department (HOSPITAL_COMMUNITY)
Admission: EM | Admit: 2014-11-16 | Discharge: 2014-11-17 | Disposition: A | Discharge status: Other Acute Inpatient Hospital | Payer: Medicaid Other | Attending: Emergency Medicine | Admitting: Emergency Medicine

## 2014-11-16 ENCOUNTER — Emergency Department (HOSPITAL_COMMUNITY): Payer: Medicaid Other

## 2014-11-16 DIAGNOSIS — R110 Nausea: Secondary | ICD-10-CM | POA: Diagnosis not present

## 2014-11-16 DIAGNOSIS — F1320 Sedative, hypnotic or anxiolytic dependence, uncomplicated: Secondary | ICD-10-CM | POA: Insufficient documentation

## 2014-11-16 DIAGNOSIS — F1920 Other psychoactive substance dependence, uncomplicated: Secondary | ICD-10-CM | POA: Diagnosis present

## 2014-11-16 DIAGNOSIS — F329 Major depressive disorder, single episode, unspecified: Secondary | ICD-10-CM | POA: Insufficient documentation

## 2014-11-16 DIAGNOSIS — F1410 Cocaine abuse, uncomplicated: Secondary | ICD-10-CM | POA: Diagnosis not present

## 2014-11-16 DIAGNOSIS — F1994 Other psychoactive substance use, unspecified with psychoactive substance-induced mood disorder: Secondary | ICD-10-CM | POA: Diagnosis present

## 2014-11-16 DIAGNOSIS — Z79899 Other long term (current) drug therapy: Secondary | ICD-10-CM | POA: Diagnosis not present

## 2014-11-16 DIAGNOSIS — F4310 Post-traumatic stress disorder, unspecified: Secondary | ICD-10-CM | POA: Insufficient documentation

## 2014-11-16 DIAGNOSIS — R000 Tachycardia, unspecified: Secondary | ICD-10-CM | POA: Insufficient documentation

## 2014-11-16 DIAGNOSIS — E860 Dehydration: Secondary | ICD-10-CM | POA: Insufficient documentation

## 2014-11-16 DIAGNOSIS — Z791 Long term (current) use of non-steroidal anti-inflammatories (NSAID): Secondary | ICD-10-CM | POA: Insufficient documentation

## 2014-11-16 DIAGNOSIS — F10230 Alcohol dependence with withdrawal, uncomplicated: Secondary | ICD-10-CM | POA: Insufficient documentation

## 2014-11-16 DIAGNOSIS — Z720 Tobacco use: Secondary | ICD-10-CM | POA: Diagnosis not present

## 2014-11-16 DIAGNOSIS — F1124 Opioid dependence with opioid-induced mood disorder: Secondary | ICD-10-CM | POA: Insufficient documentation

## 2014-11-16 DIAGNOSIS — N189 Chronic kidney disease, unspecified: Secondary | ICD-10-CM | POA: Insufficient documentation

## 2014-11-16 DIAGNOSIS — R109 Unspecified abdominal pain: Secondary | ICD-10-CM

## 2014-11-16 DIAGNOSIS — R1013 Epigastric pain: Secondary | ICD-10-CM | POA: Diagnosis not present

## 2014-11-16 DIAGNOSIS — F419 Anxiety disorder, unspecified: Secondary | ICD-10-CM | POA: Insufficient documentation

## 2014-11-16 DIAGNOSIS — J449 Chronic obstructive pulmonary disease, unspecified: Secondary | ICD-10-CM | POA: Diagnosis not present

## 2014-11-16 DIAGNOSIS — Z7952 Long term (current) use of systemic steroids: Secondary | ICD-10-CM | POA: Insufficient documentation

## 2014-11-16 LAB — URINALYSIS, ROUTINE W REFLEX MICROSCOPIC
Bilirubin Urine: NEGATIVE
Glucose, UA: NEGATIVE mg/dL
Hgb urine dipstick: NEGATIVE
KETONES UR: NEGATIVE mg/dL
Leukocytes, UA: NEGATIVE
NITRITE: NEGATIVE
Protein, ur: NEGATIVE mg/dL
Specific Gravity, Urine: 1.018 (ref 1.005–1.030)
UROBILINOGEN UA: 1 mg/dL (ref 0.0–1.0)
pH: 6.5 (ref 5.0–8.0)

## 2014-11-16 LAB — I-STAT TROPONIN, ED: TROPONIN I, POC: 0 ng/mL (ref 0.00–0.08)

## 2014-11-16 LAB — CBC WITH DIFFERENTIAL/PLATELET
Basophils Absolute: 0.1 10*3/uL (ref 0.0–0.1)
Basophils Relative: 1 % (ref 0–1)
EOS PCT: 5 % (ref 0–5)
Eosinophils Absolute: 0.5 10*3/uL (ref 0.0–0.7)
HCT: 42.5 % (ref 36.0–46.0)
Hemoglobin: 14.2 g/dL (ref 12.0–15.0)
LYMPHS ABS: 3.1 10*3/uL (ref 0.7–4.0)
LYMPHS PCT: 28 % (ref 12–46)
MCH: 30.7 pg (ref 26.0–34.0)
MCHC: 33.4 g/dL (ref 30.0–36.0)
MCV: 92 fL (ref 78.0–100.0)
Monocytes Absolute: 0.7 10*3/uL (ref 0.1–1.0)
Monocytes Relative: 6 % (ref 3–12)
Neutro Abs: 6.6 10*3/uL (ref 1.7–7.7)
Neutrophils Relative %: 60 % (ref 43–77)
PLATELETS: 252 10*3/uL (ref 150–400)
RBC: 4.62 MIL/uL (ref 3.87–5.11)
RDW: 14.5 % (ref 11.5–15.5)
WBC: 10.8 10*3/uL — AB (ref 4.0–10.5)

## 2014-11-16 LAB — RAPID URINE DRUG SCREEN, HOSP PERFORMED
AMPHETAMINES: NOT DETECTED
BENZODIAZEPINES: POSITIVE — AB
Barbiturates: POSITIVE — AB
COCAINE: POSITIVE — AB
Opiates: NOT DETECTED
TETRAHYDROCANNABINOL: NOT DETECTED

## 2014-11-16 LAB — COMPREHENSIVE METABOLIC PANEL
ALBUMIN: 3.7 g/dL (ref 3.5–5.0)
ALT: 61 U/L — ABNORMAL HIGH (ref 14–54)
ANION GAP: 10 (ref 5–15)
AST: 52 U/L — AB (ref 15–41)
Alkaline Phosphatase: 85 U/L (ref 38–126)
BILIRUBIN TOTAL: 1.1 mg/dL (ref 0.3–1.2)
BUN: 10 mg/dL (ref 6–20)
CO2: 21 mmol/L — ABNORMAL LOW (ref 22–32)
CREATININE: 1.07 mg/dL — AB (ref 0.44–1.00)
Calcium: 8.8 mg/dL — ABNORMAL LOW (ref 8.9–10.3)
Chloride: 108 mmol/L (ref 101–111)
GFR calc Af Amer: >60 mL/min (ref >60)
GFR calc non Af Amer: 60 mL/min — ABNORMAL LOW (ref >60)
Glucose, Bld: 118 mg/dL — ABNORMAL HIGH (ref 65–99)
Potassium: 3.3 mmol/L — ABNORMAL LOW (ref 3.5–5.1)
Sodium: 139 mmol/L (ref 135–145)
TOTAL PROTEIN: 7.2 g/dL (ref 6.5–8.1)

## 2014-11-16 LAB — ACETAMINOPHEN LEVEL: Acetaminophen (Tylenol), Serum: <10 ug/mL — ABNORMAL LOW (ref 10–30)

## 2014-11-16 LAB — LIPASE, BLOOD: LIPASE: 20 U/L — AB (ref 22–51)

## 2014-11-16 LAB — ETHANOL: Alcohol, Ethyl (B): <5 mg/dL (ref <5)

## 2014-11-16 LAB — SALICYLATE LEVEL: Salicylate Lvl: <4 mg/dL (ref 2.8–30.0)

## 2014-11-16 MED ORDER — THIAMINE HCL 100 MG/ML IJ SOLN: 100.0000 mg | Freq: Every day | INTRAMUSCULAR | Status: DC

## 2014-11-16 MED ORDER — NICOTINE 21 MG/24HR TD PT24: 21.0000 mg | MEDICATED_PATCH | Freq: Every day | TRANSDERMAL | Status: DC | PRN

## 2014-11-16 MED ORDER — GABAPENTIN 400 MG PO CAPS
800.0000 mg | ORAL_CAPSULE | Freq: Three times a day (TID) | ORAL | Status: DC
  Administered 2014-11-17 (×2): 800 mg via ORAL
  Filled 2014-11-16 (×3): qty 2

## 2014-11-16 MED ORDER — HYDROXYZINE HCL 25 MG PO TABS: 50.0000 mg | ORAL_TABLET | Freq: Three times a day (TID) | ORAL | Status: DC | PRN

## 2014-11-16 MED ORDER — IBUPROFEN 200 MG PO TABS
600.0000 mg | ORAL_TABLET | Freq: Three times a day (TID) | ORAL | Status: DC | PRN
  Administered 2014-11-17 (×2): 600 mg via ORAL
  Filled 2014-11-16 (×2): qty 3

## 2014-11-16 MED ORDER — IOHEXOL 300 MG/ML  SOLN
100.0000 mL | Freq: Once | INTRAMUSCULAR | Status: AC | PRN
  Administered 2014-11-16: 100 mL via INTRAVENOUS

## 2014-11-16 MED ORDER — SODIUM CHLORIDE 0.9 % IV BOLUS (SEPSIS)
1000.0000 mL | Freq: Once | INTRAVENOUS | Status: AC
  Administered 2014-11-16: 1000 mL via INTRAVENOUS

## 2014-11-16 MED ORDER — ALPRAZOLAM 1 MG PO TABS
1.0000 mg | ORAL_TABLET | Freq: Three times a day (TID) | ORAL | Status: DC
  Administered 2014-11-17 (×2): 1 mg via ORAL
  Filled 2014-11-16: qty 2
  Filled 2014-11-16: qty 1

## 2014-11-16 MED ORDER — IOHEXOL 300 MG/ML  SOLN
50.0000 mL | Freq: Once | INTRAMUSCULAR | Status: AC | PRN
  Administered 2014-11-16: 50 mL via ORAL

## 2014-11-16 MED ORDER — PANTOPRAZOLE SODIUM 40 MG PO TBEC
40.0000 mg | DELAYED_RELEASE_TABLET | Freq: Every day | ORAL | Status: DC
  Administered 2014-11-17: 40 mg via ORAL
  Filled 2014-11-16: qty 1

## 2014-11-16 MED ORDER — ONDANSETRON HCL 4 MG/2ML IJ SOLN
4.0000 mg | Freq: Once | INTRAMUSCULAR | Status: AC
  Administered 2014-11-16: 4 mg via INTRAVENOUS
  Filled 2014-11-16: qty 2

## 2014-11-16 MED ORDER — VITAMIN B-1 100 MG PO TABS
100.0000 mg | ORAL_TABLET | Freq: Every day | ORAL | Status: DC
  Administered 2014-11-17: 100 mg via ORAL
  Filled 2014-11-16: qty 1

## 2014-11-16 MED ORDER — PANTOPRAZOLE SODIUM 40 MG IV SOLR
40.0000 mg | Freq: Once | INTRAVENOUS | Status: AC
  Administered 2014-11-16: 40 mg via INTRAVENOUS
  Filled 2014-11-16: qty 40

## 2014-11-16 MED ORDER — LORAZEPAM 1 MG PO TABS: 0.0000 mg | ORAL_TABLET | Freq: Two times a day (BID) | ORAL | Status: DC

## 2014-11-16 MED ORDER — MORPHINE SULFATE 4 MG/ML IJ SOLN
4.0000 mg | Freq: Once | INTRAMUSCULAR | Status: AC
  Administered 2014-11-16: 4 mg via INTRAVENOUS
  Filled 2014-11-16: qty 1

## 2014-11-16 MED ORDER — AMITRIPTYLINE HCL 10 MG PO TABS
10.0000 mg | ORAL_TABLET | Freq: Every day | ORAL | Status: DC
  Administered 2014-11-17: 10 mg via ORAL
  Filled 2014-11-16 (×2): qty 1

## 2014-11-16 MED ORDER — CITALOPRAM HYDROBROMIDE 20 MG PO TABS
20.0000 mg | ORAL_TABLET | Freq: Every day | ORAL | Status: DC
  Administered 2014-11-17: 20 mg via ORAL
  Filled 2014-11-16: qty 1

## 2014-11-16 MED ORDER — ACETAMINOPHEN 325 MG PO TABS: 650.0000 mg | ORAL_TABLET | ORAL | Status: DC | PRN

## 2014-11-16 MED ORDER — LORAZEPAM 1 MG PO TABS
0.0000 mg | ORAL_TABLET | Freq: Four times a day (QID) | ORAL | Status: DC
Start: 2014-11-17 — End: 2014-11-17
  Administered 2014-11-17: 2 mg via ORAL
  Filled 2014-11-16: qty 2

## 2014-11-16 MED ORDER — QUETIAPINE FUMARATE 50 MG PO TABS
150.0000 mg | ORAL_TABLET | Freq: Every day | ORAL | Status: DC
  Administered 2014-11-17: 150 mg via ORAL
  Filled 2014-11-16 (×2): qty 1

## 2014-11-16 NOTE — ED Notes (Signed)
Telepsych in process 

## 2014-11-16 NOTE — ED Notes (Signed)
Pt in c/o generalized abd pain since 3am today, pt states it started after drinking mouthwash. Pt reports she is an IV drug user and an alcoholic. She ran out of alcohol last night and tried the mouthwash. Pt last used heroin two days ago. Pt reports she would like help stopping the alcohol and the drugs, requesting detox.
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
## 2014-11-16 NOTE — ED Notes (Signed)
Report to [facility] in psych ED, pt to room 42 when assessment is completed

## 2014-11-16 NOTE — ED Notes (Signed)
IV attempted x2 without success, second RN to bedside

## 2014-11-16 NOTE — ED Notes (Signed)
Pt c/o upper abdominal pain, chest pain, diarrhea nausea x 3 days. Pt states diarrhea onset 2 weeks ago.

## 2014-11-17 ENCOUNTER — Observation Stay (HOSPITAL_COMMUNITY)
Admission: AD | Admit: 2014-11-17 | Discharge: 2014-11-18 | Disposition: A | Discharge status: Home / Self Care | Payer: Medicaid Other | Source: Intra-hospital | Attending: Psychiatry | Admitting: Psychiatry

## 2014-11-17 ENCOUNTER — Encounter (HOSPITAL_COMMUNITY): Payer: Self-pay | Admitting: *Deleted

## 2014-11-17 DIAGNOSIS — F329 Major depressive disorder, single episode, unspecified: Secondary | ICD-10-CM | POA: Insufficient documentation

## 2014-11-17 DIAGNOSIS — J45909 Unspecified asthma, uncomplicated: Secondary | ICD-10-CM | POA: Diagnosis not present

## 2014-11-17 DIAGNOSIS — F419 Anxiety disorder, unspecified: Secondary | ICD-10-CM | POA: Insufficient documentation

## 2014-11-17 DIAGNOSIS — F1994 Other psychoactive substance use, unspecified with psychoactive substance-induced mood disorder: Secondary | ICD-10-CM

## 2014-11-17 DIAGNOSIS — F17210 Nicotine dependence, cigarettes, uncomplicated: Secondary | ICD-10-CM | POA: Diagnosis not present

## 2014-11-17 DIAGNOSIS — F1920 Other psychoactive substance dependence, uncomplicated: Secondary | ICD-10-CM | POA: Diagnosis not present

## 2014-11-17 DIAGNOSIS — Z79899 Other long term (current) drug therapy: Secondary | ICD-10-CM | POA: Insufficient documentation

## 2014-11-17 DIAGNOSIS — F1024 Alcohol dependence with alcohol-induced mood disorder: Secondary | ICD-10-CM

## 2014-11-17 DIAGNOSIS — F1124 Opioid dependence with opioid-induced mood disorder: Principal | ICD-10-CM

## 2014-11-17 DIAGNOSIS — J449 Chronic obstructive pulmonary disease, unspecified: Secondary | ICD-10-CM | POA: Insufficient documentation

## 2014-11-17 DIAGNOSIS — F4310 Post-traumatic stress disorder, unspecified: Secondary | ICD-10-CM | POA: Insufficient documentation

## 2014-11-17 MED ORDER — ALUM & MAG HYDROXIDE-SIMETH 200-200-20 MG/5ML PO SUSP
15.0000 mL | Freq: Four times a day (QID) | ORAL | Status: DC | PRN
  Administered 2014-11-17 – 2014-11-18 (×2): 15 mL via ORAL
  Filled 2014-11-17 (×2): qty 30

## 2014-11-17 MED ORDER — PANTOPRAZOLE SODIUM 40 MG PO TBEC
40.0000 mg | DELAYED_RELEASE_TABLET | Freq: Every day | ORAL | Status: DC
  Administered 2014-11-18: 40 mg via ORAL
  Filled 2014-11-17: qty 1

## 2014-11-17 MED ORDER — AMITRIPTYLINE HCL 10 MG PO TABS
10.0000 mg | ORAL_TABLET | Freq: Every day | ORAL | Status: DC
  Administered 2014-11-17: 10 mg via ORAL
  Filled 2014-11-17 (×4): qty 1

## 2014-11-17 MED ORDER — QUETIAPINE FUMARATE 50 MG PO TABS
150.0000 mg | ORAL_TABLET | Freq: Every day | ORAL | Status: DC
Start: 2014-11-17 — End: 2014-11-19
  Administered 2014-11-17: 150 mg via ORAL
  Filled 2014-11-17 (×2): qty 1

## 2014-11-17 MED ORDER — ONDANSETRON 4 MG PO TBDP
4.0000 mg | ORAL_TABLET | Freq: Three times a day (TID) | ORAL | Status: DC | PRN
  Administered 2014-11-18: 4 mg via ORAL
  Filled 2014-11-17: qty 1

## 2014-11-17 MED ORDER — ALBUTEROL SULFATE HFA 108 (90 BASE) MCG/ACT IN AERS
2.0000 | INHALATION_SPRAY | RESPIRATORY_TRACT | Status: DC | PRN
  Administered 2014-11-17 (×2): 2 via RESPIRATORY_TRACT
  Filled 2014-11-17 (×2): qty 6.7

## 2014-11-17 MED ORDER — METHOCARBAMOL 750 MG PO TABS
750.0000 mg | ORAL_TABLET | Freq: Three times a day (TID) | ORAL | Status: DC | PRN
  Administered 2014-11-17 – 2014-11-18 (×2): 750 mg via ORAL
  Filled 2014-11-17 (×3): qty 1

## 2014-11-17 MED ORDER — GABAPENTIN 400 MG PO CAPS
400.0000 mg | ORAL_CAPSULE | Freq: Three times a day (TID) | ORAL | Status: DC
  Administered 2014-11-17 – 2014-11-18 (×4): 400 mg via ORAL
  Filled 2014-11-17 (×4): qty 1

## 2014-11-17 MED ORDER — GABAPENTIN 400 MG PO CAPS: 400.0000 mg | ORAL_CAPSULE | Freq: Three times a day (TID) | ORAL | Status: DC

## 2014-11-17 MED ORDER — NICOTINE 21 MG/24HR TD PT24
21.0000 mg | MEDICATED_PATCH | Freq: Every day | TRANSDERMAL | Status: DC | PRN
  Administered 2014-11-17 – 2014-11-18 (×2): 21 mg via TRANSDERMAL
  Filled 2014-11-17 (×2): qty 1

## 2014-11-17 MED ORDER — HYDROXYZINE HCL 50 MG PO TABS
50.0000 mg | ORAL_TABLET | Freq: Three times a day (TID) | ORAL | Status: DC | PRN
  Administered 2014-11-17 – 2014-11-18 (×3): 50 mg via ORAL
  Filled 2014-11-17 (×3): qty 1

## 2014-11-17 MED ORDER — THIAMINE HCL 100 MG/ML IJ SOLN: 100.0000 mg | Freq: Every day | INTRAMUSCULAR | Status: DC

## 2014-11-17 MED ORDER — LORAZEPAM 1 MG PO TABS
0.0000 mg | ORAL_TABLET | Freq: Two times a day (BID) | ORAL | Status: DC
  Administered 2014-11-18: 1 mg via ORAL

## 2014-11-17 MED ORDER — ALBUTEROL SULFATE HFA 108 (90 BASE) MCG/ACT IN AERS
2.0000 | INHALATION_SPRAY | RESPIRATORY_TRACT | Status: DC | PRN
Start: 2014-11-17 — End: 2014-11-19
  Administered 2014-11-17 (×2): 2 via RESPIRATORY_TRACT
  Filled 2014-11-17 (×2): qty 6.7

## 2014-11-17 MED ORDER — IBUPROFEN 600 MG PO TABS
600.0000 mg | ORAL_TABLET | Freq: Three times a day (TID) | ORAL | Status: DC | PRN
  Administered 2014-11-18: 600 mg via ORAL
  Filled 2014-11-17 (×2): qty 1

## 2014-11-17 MED ORDER — VITAMIN B-1 100 MG PO TABS
100.0000 mg | ORAL_TABLET | Freq: Every day | ORAL | Status: DC
  Administered 2014-11-18: 100 mg via ORAL
  Filled 2014-11-17: qty 1

## 2014-11-17 MED ORDER — CITALOPRAM HYDROBROMIDE 20 MG PO TABS
20.0000 mg | ORAL_TABLET | Freq: Every day | ORAL | Status: DC
  Administered 2014-11-18: 20 mg via ORAL
  Filled 2014-11-17: qty 1

## 2014-11-17 MED ORDER — LORAZEPAM 1 MG PO TABS
0.0000 mg | ORAL_TABLET | Freq: Four times a day (QID) | ORAL | Status: DC
  Administered 2014-11-17 (×2): 1 mg via ORAL
  Filled 2014-11-17 (×3): qty 1
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
## 2014-11-17 NOTE — [facility] Counselor (Addendum)
Counselor contacted the following facilities for bed availability:  [facility]- No beds - 2 pending female beds (may have a bed if 2 pending does not show)  [facility]- no intakes available until Nov 28, 2014  [facility]- Current beds available  [facility] - Waiting list could be "from a few days to a few months"  [facility]- Requires to speak with patient directly  [facility] - No beds available until December 13, 2014  [facility]- No beds available until "maybe mid-June"  [facility]- Beds available    Counselor completed prescreen for [facility] and faxed the forms and lbs requested. Counsel[facility] and [facility] for [facility]. Counselor provided patient with telephone number and phone to contact [facility] for an interview. Patient states that she prefers not to go to [facility] due to the distance and lack of transportation. Counselor contacted [facility] for additional information on resources. [facility] stated that they have the same list of facilities that currently offered detox services and accept Medicaid.

## 2014-11-17 NOTE — [facility] Counselor (Signed)
LCSW contacted [facility] for the status of reported.  [facility] intake indicated that the referral was received, but they have not had time to review the referral for admission due to recent shift change, but would review it soon.
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
## 2014-11-17 NOTE — BHH Counselor (Signed)
LCSW contacted [facility] at [phone] to check the status of the referral. Intake indicated that there are not any beds available at this time.

## 2014-11-17 NOTE — Plan of Care (Signed)
[facility]  Reason for Crisis Plan:  Crisis Stabilization   Plan of Care:  Referral for Substance Abuse  Family Support:    None reported  Current Living Environment:  Living Arrangements: Alone  Insurance:   Hospital Account    Name Acct ID Class Status Primary Coverage   Charlotte Fisher, Charlotte Fisher [id number] BEHAVIORAL HEALTH OBSERVATION Open [place]        Guarantor Account (for Hospital Account [id number])    Name Relation to Pt Service Area Active? Acct Type   Charlotte Fisher, Charlotte Fisher Self [facility]   Address Phone       [address] [place], [place] [address] [phone])          Coverage Information (for Hospital Account [id number])    F/O Payor/Plan Precert #   [facility]    Subscriber Subscriber #   Charlotte Fisher, Charlotte Fisher [id number]   Address Phone   [address] [place], [place] [address] [phone]      Legal Guardian:    Patient denies  Primary Care Provider:  PROVIDER NOT IN SYSTEM  Current Outpatient Providers:  [facility]  Psychiatrist:   None reported  Counselor/Therapist:   None reported  Compliant with Medications:  No  Additional Information:   Charlotte Fisher, Charlotte Fisher 5/20/20163:04 PM

## 2014-11-17 NOTE — BHH Counselor (Signed)
LCSW spoke with the patient briefly and the patient requested that she speak with someone about being transferred to the adult unit. Patient states that she was under the impression that she would stay here for 23 hours and then be transferred to the unit for detox and to get her prescriptions for 120 days. Patient states that [facility] and was discharged due to not having enough medications. Patient states that she would like to go onto the unit to speak with the doctor that discharged her about her medication refill so that [facility]. Patient became irritated and asked to speak with someone who could get her admitted to the unit because someone at [facility] told her that she would be held here for 23 hours and then she would be transferred to the unit for detox.  This Clinical research associatewriter tried to explain that the unit no longer provides detoxification only and patient requested to speak with someone who "knows what they are talking about to answer my questions." Patient states that she would be open to going to [facility] and [facility]. This Clinical research associatewriter provided the contact information for [facility] and [facility].    LCSW contacted [facility] and [facility] for bed availability and left messages to return call due to no answer. LCSW will continue to seek placement at a detox facility.

## 2014-11-17 NOTE — ED Notes (Signed)
Pt presented with complaint of abdominal pain after drinking mouthwash.  Pt reports she drinks 15 Mickeys and 6 White liquors drinks per day.  Pt admits to Heroin abuse also.  Denies SI or HI, admits to auditory hallucinations, feeling hopeless.  Pt states she has been off meds for past 3 weeks, and attempted SI 4 years ago.  Pt  AAO x 3, cooperative, slighty anxious.  Monitoring for safety, Q 15 min checks in effect.

## 2014-11-17 NOTE — Progress Notes (Signed)
Tried to wake Pt up at this time for medication and assessment, pt still very much asleep. Pt can be heard snoring with regular breaths. Will try again at 2230.

## 2014-11-17 NOTE — Progress Notes (Signed)
Patient ID: [name], female   DOB: 07/18/1965, 49 y.o.   MRN: [id number] Calmer after the Ativan, but also stated to writer if I gave her Ativan she wouldn't ask for anything else. She is asking her peer patient to ask me for something to drink and not her.Is coloring pictures and eating a snack at this moment. Quiet.

## 2014-11-17 NOTE — Progress Notes (Signed)
Pt deeply asleep at shift change. Several breaths observed. No sign of discomfort at this time. Pt's environment is free potentially dangerous items. Will continually monitor pt for safety and any form of discomforts.

## 2014-11-17 NOTE — BHH Counselor (Signed)
Mindy at [facility] stated that based on the acuity of the patient that she cannot be accepted at this time.  Mindy stated that she feels that the patient needs a higher level of care at this time.

## 2014-11-17 NOTE — Progress Notes (Signed)
Patient ID: Charlotte DuttonMelanie Kalb, female   DOB: 03/28/1966, 49 y.o.   MRN: [id number] Sleeping, after getting a recent Robaxin and Neurontin. Is asking repeatedly when she can take an Ativan because she is shakey. Despite multiple explanations re meds and time, she continues to ask about it. Doesn't seem motivated to get sober.
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
## 2014-11-17 NOTE — Progress Notes (Signed)
This NP has reviewed the assessment from Nanine MeansJamison Lord, NP and I concur with her findings. This NP will complete H&P in AM as we cannot double-charge pt for consult in same day.  If pt is accepted to [facility] or any detox/rehab facility, please inform them that pt CAN GO first thing tomorrow morning, to allow for prescriptions to be written and signed.   Beau FannyWITHROW, Tida Saner C, FNP-BC   11/17/2014       04:36 PM
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
## 2014-11-17 NOTE — ED Notes (Signed)
Scant wheezing and congestion noted.  Dr Ranae PalmsYelverton notified.

## 2014-11-17 NOTE — BH Assessment (Signed)
BHH Assessment Progress Note  Per Charlotte MinsMojeed Akintayo, MD, this pt would benefit from [facility] at this [facility].  [facility] Heinrichina Tate, RN, [facility] pt to Obs 4.  Pt has signed Voluntary Admission and Consent for Treatment, as well as Consent to Release Information, and signed forms have [facility] to [facility].  Pt's nurse, [name], has been notified, and agrees to send original paperwork along with pt via [name], and to call report to [phone] or [phone].  [name], MA Triage Specialist [phone]

## 2014-11-17 NOTE — BHH Counselor (Signed)
LCSW faxed completed referral for [facility] for consideration for placement.

## 2014-11-17 NOTE — BHH Counselor (Signed)
Per Donell SievertSpencer Simon, PA, pt meets inpt criteria. [facility], pending bed availability this morning.

## 2014-11-18 DIAGNOSIS — F1124 Opioid dependence with opioid-induced mood disorder: Secondary | ICD-10-CM | POA: Diagnosis not present

## 2014-11-18 MED ORDER — TIOTROPIUM BROMIDE MONOHYDRATE 18 MCG IN CAPS: 18.0000 ug | ORAL_CAPSULE | Freq: Every day | RESPIRATORY_TRACT | Status: DC

## 2014-11-18 MED ORDER — HYDROXYZINE HCL 50 MG PO TABS: 50.0000 mg | ORAL_TABLET | Freq: Three times a day (TID) | ORAL | Status: DC | PRN

## 2014-11-18 MED ORDER — NICOTINE 21 MG/24HR TD PT24: 21.0000 mg | MEDICATED_PATCH | Freq: Every day | TRANSDERMAL | Status: DC

## 2014-11-18 MED ORDER — AMITRIPTYLINE HCL 10 MG PO TABS: 10.0000 mg | ORAL_TABLET | Freq: Every day | ORAL | Status: DC

## 2014-11-18 MED ORDER — GABAPENTIN 300 MG PO CAPS: 600.0000 mg | ORAL_CAPSULE | Freq: Three times a day (TID) | ORAL | Status: DC

## 2014-11-18 MED ORDER — PANTOPRAZOLE SODIUM 40 MG PO TBEC: 40.0000 mg | DELAYED_RELEASE_TABLET | Freq: Every day | ORAL | Status: DC

## 2014-11-18 MED ORDER — QUETIAPINE FUMARATE 50 MG PO TABS: 150.0000 mg | ORAL_TABLET | Freq: Every day | ORAL | Status: DC

## 2014-11-18 MED ORDER — ONDANSETRON 4 MG PO TBDP: 4.0000 mg | ORAL_TABLET | Freq: Three times a day (TID) | ORAL | Status: DC | PRN

## 2014-11-18 MED ORDER — METHOCARBAMOL 750 MG PO TABS: 750.0000 mg | ORAL_TABLET | Freq: Three times a day (TID) | ORAL | Status: DC | PRN

## 2014-11-18 MED ORDER — MELOXICAM 7.5 MG PO TABS: 7.5000 mg | ORAL_TABLET | Freq: Every day | ORAL | Status: DC

## 2014-11-18 MED ORDER — CITALOPRAM HYDROBROMIDE 20 MG PO TABS: 20.0000 mg | ORAL_TABLET | Freq: Every day | ORAL | Status: DC

## 2014-11-18 NOTE — BHH Counselor (Signed)
LCSW contacted [employer] at [phone] to request transportation for the patient.

## 2014-11-18 NOTE — Progress Notes (Signed)
D: Pt woke up at about 2145 complaining of of bloated abdomen and needing to use the bathroom. Pt also had moist skin from sweating and a little tremor was observed. Pt also C/O of pain score of 10 from her bloated abd, mild anxiety and was very worried about finding her placement for tomorrow. Pt had a CIWA score of 7.  A: Due and PRN medications given as ordered. Support and encouragement offered. Will continually be monitored for safety.  R: Pt tolerated medications appropriately, states she feels better after bathroom use. Pt is sleeping comfortably at this time.

## 2014-11-18 NOTE — BHH Counselor (Signed)
LCSW contacted [facility] and asked about the referral and spoke with Tyawn who stated that there are not any [facility] or [place] beds available at this time. LCSW will speak with the [facility] regarding transpiration to the shelter. The patient reports that the address to the shelter is:  [address] [place], [place] [id number]  [facility] Heinrichina Tate, RN, [facility] approved a transportation voucher for the patient to go to the shelter at the flat rate of $43.  Patient will be discharged with transportation to a shelter, substance abuse resources, and information to contact DSS for a list of Primary Care Physicians for her ongoing healthcare needs. Patient also has information for mobile crisis and [facility] if needed.

## 2014-11-18 NOTE — BHH Counselor (Signed)
Completed Taxi Voucher for an estimate of $43 for [employer] [id number] to transport patient. Provided voucher to driver and provided Berneice Heinrichina Tate, RN, [facility] with the yellow copy.
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
## 2014-11-18 NOTE — BHH Counselor (Signed)
Counselor contacted the following facilities to check for bed availability:  [facility] - Casimiro NeedleMichael -No female beds and no scheduled discharges  [facility]- Andrey CampanileSandy states that there are currently beds available  [facility] - Danny - states that they are currently not accepting referrals due to switching to [facility] this weekend  [facility] - Pattricia Bossnnie - currently one female bed available  [facility]- No beds available until Monday per Simonne ComeLeo.  Counselor faxed referrals to the following facilities reporting that they are currently accepting referrals:  [facility]- faxed successfully-Contact Pattricia Bossnnie [phone], Pattricia Bossnnie requested the fax be sent to [phone], sent at 9:59AM successful at 10:08AM.  [facility] - Faxed successfully - Patient was denied per Melody
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
## 2014-11-18 NOTE — Discharge Instructions (Addendum)
Patient will be discharged to a [facility]. Shelter can be reached at [phone]. Patient has been accepted with a bed and she has a transportation voucher via [employer] service. Patient received information for local Substance Abuse Treatment, Intensive Outpatient, Residential Programs, Outpatient Programs, and AA Meetings.  Patient also received information to contact DSS to request information for Primary Care Physicians who accept Medicaid and requesting that the Physician on her Medicaid card be updated. Patient received information to contact [facility] and Mobile Crisis for support if needed.  Patient can contact 211 for additional support. Patient has information for Medication Assistance programs with contact information to call for information. Patient also has housing resources [place] [facility] which include information to obtain subsidized housing, emergency housing, and information to contact the [employer] for additional assistance.
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
## 2014-11-18 NOTE — BHH Counselor (Signed)
LCSW met with patient at bedside to discuss discharge plans.  Patient states that she plans to go to the shelter and denies HI/SI and psychosis. Patient was provided information for housing and emergency housing assistance, prescription assistance programs, substance abuse programs, NA/AA meetings, and information for mobile crisis. Patient was also provided the information for 211 to contact in case she needed additional resources.  Patient stated that she does not currently have any additional questions and expressed gratitude for the assistance.
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
## 2014-12-04 ENCOUNTER — Encounter (HOSPITAL_COMMUNITY): Payer: Self-pay | Admitting: *Deleted

## 2014-12-04 ENCOUNTER — Inpatient Hospital Stay (HOSPITAL_COMMUNITY)
Admission: EM | Admit: 2014-12-04 | Discharge: 2014-12-07 | DRG: 918 | Disposition: A | Discharge status: Other Acute Inpatient Hospital | Payer: Medicaid Other | Attending: Internal Medicine | Admitting: Internal Medicine

## 2014-12-04 ENCOUNTER — Emergency Department (HOSPITAL_COMMUNITY): Payer: Medicaid Other

## 2014-12-04 DIAGNOSIS — F1920 Other psychoactive substance dependence, uncomplicated: Secondary | ICD-10-CM | POA: Diagnosis not present

## 2014-12-04 DIAGNOSIS — F4310 Post-traumatic stress disorder, unspecified: Secondary | ICD-10-CM | POA: Diagnosis present

## 2014-12-04 DIAGNOSIS — T405X2A Poisoning by cocaine, intentional self-harm, initial encounter: Secondary | ICD-10-CM | POA: Diagnosis present

## 2014-12-04 DIAGNOSIS — F10229 Alcohol dependence with intoxication, unspecified: Secondary | ICD-10-CM | POA: Diagnosis present

## 2014-12-04 DIAGNOSIS — B1920 Unspecified viral hepatitis C without hepatic coma: Secondary | ICD-10-CM | POA: Diagnosis present

## 2014-12-04 DIAGNOSIS — T43012A Poisoning by tricyclic antidepressants, intentional self-harm, initial encounter: Secondary | ICD-10-CM | POA: Diagnosis present

## 2014-12-04 DIAGNOSIS — T1491 Suicide attempt: Secondary | ICD-10-CM | POA: Diagnosis not present

## 2014-12-04 DIAGNOSIS — T50901A Poisoning by unspecified drugs, medicaments and biological substances, accidental (unintentional), initial encounter: Secondary | ICD-10-CM

## 2014-12-04 DIAGNOSIS — R531 Weakness: Secondary | ICD-10-CM

## 2014-12-04 DIAGNOSIS — F1410 Cocaine abuse, uncomplicated: Secondary | ICD-10-CM | POA: Diagnosis present

## 2014-12-04 DIAGNOSIS — T510X2A Toxic effect of ethanol, intentional self-harm, initial encounter: Secondary | ICD-10-CM | POA: Diagnosis present

## 2014-12-04 DIAGNOSIS — F1910 Other psychoactive substance abuse, uncomplicated: Secondary | ICD-10-CM

## 2014-12-04 DIAGNOSIS — M79602 Pain in left arm: Secondary | ICD-10-CM | POA: Diagnosis present

## 2014-12-04 DIAGNOSIS — Z590 Homelessness: Secondary | ICD-10-CM

## 2014-12-04 DIAGNOSIS — F419 Anxiety disorder, unspecified: Secondary | ICD-10-CM | POA: Diagnosis present

## 2014-12-04 DIAGNOSIS — F1994 Other psychoactive substance use, unspecified with psychoactive substance-induced mood disorder: Secondary | ICD-10-CM | POA: Diagnosis present

## 2014-12-04 DIAGNOSIS — Z823 Family history of stroke: Secondary | ICD-10-CM | POA: Diagnosis not present

## 2014-12-04 DIAGNOSIS — J449 Chronic obstructive pulmonary disease, unspecified: Secondary | ICD-10-CM | POA: Diagnosis present

## 2014-12-04 DIAGNOSIS — Z79899 Other long term (current) drug therapy: Secondary | ICD-10-CM

## 2014-12-04 DIAGNOSIS — N189 Chronic kidney disease, unspecified: Secondary | ICD-10-CM | POA: Diagnosis present

## 2014-12-04 DIAGNOSIS — E876 Hypokalemia: Secondary | ICD-10-CM | POA: Diagnosis present

## 2014-12-04 DIAGNOSIS — T50902A Poisoning by unspecified drugs, medicaments and biological substances, intentional self-harm, initial encounter: Secondary | ICD-10-CM

## 2014-12-04 DIAGNOSIS — T43592A Poisoning by other antipsychotics and neuroleptics, intentional self-harm, initial encounter: Secondary | ICD-10-CM | POA: Diagnosis not present

## 2014-12-04 DIAGNOSIS — G8929 Other chronic pain: Secondary | ICD-10-CM | POA: Diagnosis present

## 2014-12-04 DIAGNOSIS — J45909 Unspecified asthma, uncomplicated: Secondary | ICD-10-CM | POA: Diagnosis present

## 2014-12-04 DIAGNOSIS — T1491XA Suicide attempt, initial encounter: Secondary | ICD-10-CM

## 2014-12-04 DIAGNOSIS — F1024 Alcohol dependence with alcohol-induced mood disorder: Secondary | ICD-10-CM | POA: Diagnosis not present

## 2014-12-04 DIAGNOSIS — F17210 Nicotine dependence, cigarettes, uncomplicated: Secondary | ICD-10-CM | POA: Diagnosis present

## 2014-12-04 DIAGNOSIS — F3132 Bipolar disorder, current episode depressed, moderate: Secondary | ICD-10-CM | POA: Diagnosis not present

## 2014-12-04 DIAGNOSIS — F329 Major depressive disorder, single episode, unspecified: Secondary | ICD-10-CM | POA: Diagnosis present

## 2014-12-04 HISTORY — DX: Unspecified viral hepatitis C without hepatic coma: B19.20

## 2014-12-04 LAB — CBC WITH DIFFERENTIAL/PLATELET
BASOS ABS: 0.1 10*3/uL (ref 0.0–0.1)
Basophils Relative: 1 % (ref 0–1)
EOS PCT: 4 % (ref 0–5)
Eosinophils Absolute: 0.3 10*3/uL (ref 0.0–0.7)
HCT: 39.9 % (ref 36.0–46.0)
Hemoglobin: 13 g/dL (ref 12.0–15.0)
LYMPHS ABS: 3.4 10*3/uL (ref 0.7–4.0)
LYMPHS PCT: 42 % (ref 12–46)
MCH: 30.9 pg (ref 26.0–34.0)
MCHC: 32.6 g/dL (ref 30.0–36.0)
MCV: 94.8 fL (ref 78.0–100.0)
Monocytes Absolute: 0.6 10*3/uL (ref 0.1–1.0)
Monocytes Relative: 7 % (ref 3–12)
NEUTROS ABS: 3.7 10*3/uL (ref 1.7–7.7)
Neutrophils Relative %: 46 % (ref 43–77)
Platelets: 255 10*3/uL (ref 150–400)
RBC: 4.21 MIL/uL (ref 3.87–5.11)
RDW: 15 % (ref 11.5–15.5)
WBC: 8 10*3/uL (ref 4.0–10.5)

## 2014-12-04 LAB — COMPREHENSIVE METABOLIC PANEL
ALT: 46 U/L (ref 14–54)
AST: 57 U/L — ABNORMAL HIGH (ref 15–41)
Albumin: 3.5 g/dL (ref 3.5–5.0)
Alkaline Phosphatase: 83 U/L (ref 38–126)
Anion gap: 10 (ref 5–15)
BUN: 8 mg/dL (ref 6–20)
CO2: 27 mmol/L (ref 22–32)
Calcium: 8.6 mg/dL — ABNORMAL LOW (ref 8.9–10.3)
Chloride: 105 mmol/L (ref 101–111)
Creatinine, Ser: 0.98 mg/dL (ref 0.44–1.00)
GLUCOSE: 121 mg/dL — AB (ref 65–99)
POTASSIUM: 2.8 mmol/L — AB (ref 3.5–5.1)
SODIUM: 142 mmol/L (ref 135–145)
Total Bilirubin: 0.4 mg/dL (ref 0.3–1.2)
Total Protein: 7.4 g/dL (ref 6.5–8.1)

## 2014-12-04 LAB — MAGNESIUM: MAGNESIUM: 1.8 mg/dL (ref 1.7–2.4)

## 2014-12-04 LAB — ACETAMINOPHEN LEVEL

## 2014-12-04 LAB — URINALYSIS, ROUTINE W REFLEX MICROSCOPIC
Bilirubin Urine: NEGATIVE
GLUCOSE, UA: NEGATIVE mg/dL
Hgb urine dipstick: NEGATIVE
Ketones, ur: NEGATIVE mg/dL
LEUKOCYTES UA: NEGATIVE
NITRITE: NEGATIVE
PH: 6 (ref 5.0–8.0)
PROTEIN: NEGATIVE mg/dL
Specific Gravity, Urine: 1.006 (ref 1.005–1.030)
Urobilinogen, UA: 0.2 mg/dL (ref 0.0–1.0)

## 2014-12-04 LAB — PREGNANCY, URINE: PREG TEST UR: NEGATIVE

## 2014-12-04 LAB — RAPID URINE DRUG SCREEN, HOSP PERFORMED
AMPHETAMINES: NOT DETECTED
Barbiturates: NOT DETECTED
Benzodiazepines: POSITIVE — AB
Cocaine: POSITIVE — AB
Opiates: NOT DETECTED
Tetrahydrocannabinol: NOT DETECTED

## 2014-12-04 LAB — MRSA PCR SCREENING: MRSA by PCR: NEGATIVE

## 2014-12-04 LAB — ETHANOL: Alcohol, Ethyl (B): 109 mg/dL — ABNORMAL HIGH (ref <5)

## 2014-12-04 LAB — SALICYLATE LEVEL: Salicylate Lvl: <4 mg/dL (ref 2.8–30.0)

## 2014-12-04 MED ORDER — HYDROCODONE-ACETAMINOPHEN 5-325 MG PO TABS
1.0000 | ORAL_TABLET | Freq: Once | ORAL | Status: DC
  Filled 2014-12-04: qty 1

## 2014-12-04 MED ORDER — METHYLPREDNISOLONE SODIUM SUCC 125 MG IJ SOLR: 125.0000 mg | Freq: Once | INTRAMUSCULAR | Status: DC

## 2014-12-04 MED ORDER — LORAZEPAM 1 MG PO TABS
1.0000 mg | ORAL_TABLET | Freq: Four times a day (QID) | ORAL | Status: AC | PRN
  Administered 2014-12-04 – 2014-12-07 (×8): 1 mg via ORAL
  Filled 2014-12-04 (×8): qty 1

## 2014-12-04 MED ORDER — LORAZEPAM 2 MG/ML IJ SOLN
1.0000 mg | Freq: Four times a day (QID) | INTRAMUSCULAR | Status: AC | PRN
  Administered 2014-12-04 – 2014-12-05 (×2): 1 mg via INTRAVENOUS
  Filled 2014-12-04 (×2): qty 1

## 2014-12-04 MED ORDER — POTASSIUM CHLORIDE CRYS ER 20 MEQ PO TBCR
40.0000 meq | EXTENDED_RELEASE_TABLET | Freq: Once | ORAL | Status: AC
  Administered 2014-12-04: 40 meq via ORAL
  Filled 2014-12-04: qty 2

## 2014-12-04 MED ORDER — FOLIC ACID 1 MG PO TABS
1.0000 mg | ORAL_TABLET | Freq: Every day | ORAL | Status: DC
  Administered 2014-12-04 – 2014-12-07 (×4): 1 mg via ORAL
  Filled 2014-12-04 (×4): qty 1

## 2014-12-04 MED ORDER — IPRATROPIUM-ALBUTEROL 0.5-2.5 (3) MG/3ML IN SOLN: 3.0000 mL | Freq: Four times a day (QID) | RESPIRATORY_TRACT | Status: DC | PRN

## 2014-12-04 MED ORDER — HYDROMORPHONE HCL 1 MG/ML IJ SOLN: 1.0000 mg | Freq: Once | INTRAMUSCULAR | Status: DC

## 2014-12-04 MED ORDER — SODIUM CHLORIDE 0.9 % IJ SOLN
3.0000 mL | Freq: Two times a day (BID) | INTRAMUSCULAR | Status: DC
  Administered 2014-12-06: 3 mL via INTRAVENOUS

## 2014-12-04 MED ORDER — THIAMINE HCL 100 MG/ML IJ SOLN: 100.0000 mg | Freq: Every day | INTRAMUSCULAR | Status: DC

## 2014-12-04 MED ORDER — KETOROLAC TROMETHAMINE 30 MG/ML IJ SOLN: 30.0000 mg | Freq: Once | INTRAMUSCULAR | Status: DC

## 2014-12-04 MED ORDER — SODIUM CHLORIDE 0.9 % IV SOLN
INTRAVENOUS | Status: DC
  Administered 2014-12-04: 12:00:00 via INTRAVENOUS

## 2014-12-04 MED ORDER — TIOTROPIUM BROMIDE MONOHYDRATE 18 MCG IN CAPS
18.0000 ug | ORAL_CAPSULE | Freq: Every day | RESPIRATORY_TRACT | Status: DC
Start: 2014-12-04 — End: 2014-12-07
  Administered 2014-12-04 – 2014-12-07 (×4): 18 ug via RESPIRATORY_TRACT
  Filled 2014-12-04 (×2): qty 5

## 2014-12-04 MED ORDER — ADULT MULTIVITAMIN W/MINERALS CH
1.0000 | ORAL_TABLET | Freq: Every day | ORAL | Status: DC
  Administered 2014-12-04 – 2014-12-07 (×4): 1 via ORAL
  Filled 2014-12-04 (×4): qty 1

## 2014-12-04 MED ORDER — SODIUM CHLORIDE 0.9 % IV BOLUS (SEPSIS)
500.0000 mL | Freq: Once | INTRAVENOUS | Status: AC
  Administered 2014-12-04: 500 mL via INTRAVENOUS

## 2014-12-04 MED ORDER — VITAMIN B-1 100 MG PO TABS
100.0000 mg | ORAL_TABLET | Freq: Every day | ORAL | Status: DC
  Administered 2014-12-04 – 2014-12-07 (×4): 100 mg via ORAL
  Filled 2014-12-04 (×4): qty 1

## 2014-12-04 MED ORDER — SODIUM BICARBONATE 8.4 % IV SOLN
50.0000 meq | Freq: Once | INTRAVENOUS | Status: AC
  Administered 2014-12-04: 50 meq via INTRAVENOUS
  Filled 2014-12-04: qty 50

## 2014-12-04 MED ORDER — POTASSIUM CHLORIDE CRYS ER 20 MEQ PO TBCR
40.0000 meq | EXTENDED_RELEASE_TABLET | Freq: Two times a day (BID) | ORAL | Status: DC
  Administered 2014-12-04 (×2): 40 meq via ORAL
  Filled 2014-12-04 (×2): qty 2

## 2014-12-04 MED ORDER — MAGNESIUM SULFATE 2 GM/50ML IV SOLN
2.0000 g | Freq: Once | INTRAVENOUS | Status: AC
  Administered 2014-12-04: 2 g via INTRAVENOUS
  Filled 2014-12-04: qty 50

## 2014-12-04 MED ORDER — ENOXAPARIN SODIUM 40 MG/0.4ML ~~LOC~~ SOLN
40.0000 mg | SUBCUTANEOUS | Status: DC
  Administered 2014-12-04 – 2014-12-05 (×2): 40 mg via SUBCUTANEOUS
  Filled 2014-12-04 (×4): qty 0.4

## 2014-12-04 MED ORDER — ONDANSETRON HCL 4 MG/2ML IJ SOLN: 4.0000 mg | Freq: Once | INTRAMUSCULAR | Status: DC

## 2014-12-04 NOTE — Progress Notes (Signed)
Date:  December 04, 2014 U.R. performed for needs and level of care. Will continue to follow for Case Management needs.  Rhonda Davis, RN, BSN, CCM   [phone] 

## 2014-12-04 NOTE — Care Management Note (Signed)
Case Management Note  Patient Details  Name: Charlotte Fisher MRN: [id number] Date of Birth: 10/31/1965  Subjective/Objective:            overdose        Action/Plan:   Expected Discharge Date:   (unknown)               Expected Discharge Plan:  Home/Self Care  In-House Referral:  Clinical Social Work  Discharge planning Services  CM Consult  Post Acute Care Choice:  NA Choice offered to:  NA  DME Arranged:    DME Agency:     HH Arranged:    HH Agency:     Status of Service:  In process, will continue to follow  Medicare Important Message Given:    Date Medicare IM Given:    Medicare IM give by:    Date Additional Medicare IM Given:    Additional Medicare Important Message give by:     If discussed at Long Length of Stay Meetings, dates discussed:    Additional Comments:  Golda AcreDavis, Toleen Lachapelle Lynn, RN 12/04/2014, 1:08 PM

## 2014-12-04 NOTE — ED Notes (Signed)
Bed: RESA Expected date:  Expected time:  Means of arrival:  Comments: EMS 33F OD seroquel, ETOH, Suicidal
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
## 2014-12-04 NOTE — ED Notes (Signed)
Patient transported to CT 
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
## 2014-12-04 NOTE — ED Notes (Signed)
Attempted to call report to receiving nurse. Receiving nurse to call back to the ED.

## 2014-12-04 NOTE — ED Notes (Signed)
1 unsuccessful attempt at an in and out cath made.
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
## 2014-12-05 DIAGNOSIS — Z590 Homelessness: Secondary | ICD-10-CM

## 2014-12-05 DIAGNOSIS — T43012A Poisoning by tricyclic antidepressants, intentional self-harm, initial encounter: Secondary | ICD-10-CM

## 2014-12-05 DIAGNOSIS — F1920 Other psychoactive substance dependence, uncomplicated: Secondary | ICD-10-CM

## 2014-12-05 DIAGNOSIS — T426X2A Poisoning by other antiepileptic and sedative-hypnotic drugs, intentional self-harm, initial encounter: Secondary | ICD-10-CM

## 2014-12-05 DIAGNOSIS — F329 Major depressive disorder, single episode, unspecified: Secondary | ICD-10-CM

## 2014-12-05 DIAGNOSIS — T43592A Poisoning by other antipsychotics and neuroleptics, intentional self-harm, initial encounter: Principal | ICD-10-CM

## 2014-12-05 LAB — LACTIC ACID, PLASMA: LACTIC ACID, VENOUS: 1.2 mmol/L (ref 0.5–2.0)

## 2014-12-05 LAB — COMPREHENSIVE METABOLIC PANEL
ALBUMIN: 2.7 g/dL — AB (ref 3.5–5.0)
ALK PHOS: 79 U/L (ref 38–126)
ALT: 31 U/L (ref 14–54)
ANION GAP: 6 (ref 5–15)
AST: 35 U/L (ref 15–41)
BUN: 6 mg/dL (ref 6–20)
CALCIUM: 8.1 mg/dL — AB (ref 8.9–10.3)
CHLORIDE: 110 mmol/L (ref 101–111)
CO2: 25 mmol/L (ref 22–32)
Creatinine, Ser: 1.08 mg/dL — ABNORMAL HIGH (ref 0.44–1.00)
GFR calc non Af Amer: 59 mL/min — ABNORMAL LOW (ref >60)
GLUCOSE: 97 mg/dL (ref 65–99)
Potassium: 4.6 mmol/L (ref 3.5–5.1)
Sodium: 141 mmol/L (ref 135–145)
Total Bilirubin: 0.6 mg/dL (ref 0.3–1.2)
Total Protein: 5.8 g/dL — ABNORMAL LOW (ref 6.5–8.1)

## 2014-12-05 LAB — CBC
HEMATOCRIT: 37.5 % (ref 36.0–46.0)
Hemoglobin: 12 g/dL (ref 12.0–15.0)
MCH: 30.5 pg (ref 26.0–34.0)
MCHC: 32 g/dL (ref 30.0–36.0)
MCV: 95.2 fL (ref 78.0–100.0)
Platelets: 216 10*3/uL (ref 150–400)
RBC: 3.94 MIL/uL (ref 3.87–5.11)
RDW: 15.5 % (ref 11.5–15.5)
WBC: 7.6 10*3/uL (ref 4.0–10.5)

## 2014-12-05 MED ORDER — CALCIUM CARBONATE ANTACID 500 MG PO CHEW
1.0000 | CHEWABLE_TABLET | Freq: Four times a day (QID) | ORAL | Status: DC | PRN
  Administered 2014-12-05 – 2014-12-06 (×3): 200 mg via ORAL
  Filled 2014-12-05 (×3): qty 1

## 2014-12-05 MED ORDER — TRAZODONE HCL 50 MG PO TABS
50.0000 mg | ORAL_TABLET | Freq: Every evening | ORAL | Status: DC | PRN
  Administered 2014-12-05 – 2014-12-06 (×2): 50 mg via ORAL
  Filled 2014-12-05 (×2): qty 1

## 2014-12-05 MED ORDER — ALBUTEROL SULFATE (2.5 MG/3ML) 0.083% IN NEBU
2.5000 mg | INHALATION_SOLUTION | RESPIRATORY_TRACT | Status: DC | PRN
  Administered 2014-12-06: 2.5 mg via RESPIRATORY_TRACT
  Filled 2014-12-05: qty 3

## 2014-12-05 MED ORDER — ACETAMINOPHEN 325 MG PO TABS
650.0000 mg | ORAL_TABLET | Freq: Four times a day (QID) | ORAL | Status: DC | PRN
  Administered 2014-12-05 – 2014-12-06 (×3): 650 mg via ORAL
  Filled 2014-12-05 (×5): qty 2

## 2014-12-05 MED ORDER — NICOTINE 21 MG/24HR TD PT24
21.0000 mg | MEDICATED_PATCH | Freq: Every day | TRANSDERMAL | Status: DC
  Administered 2014-12-05 – 2014-12-07 (×3): 21 mg via TRANSDERMAL
  Filled 2014-12-05 (×3): qty 1
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
## 2014-12-05 NOTE — Progress Notes (Signed)
PROGRESS NOTE  Charlotte DuttonMelanie Fisher XBM:[id number] DOB: 06/19/1966 DOA: 12/04/2014 PCP: PROVIDER NOT IN SYSTEM  Summary: 49 year old woman history of polysubstance abuse, by report found sleeping today outside behind a building. Someone called EMS. Reports she took a handful of Seroquel, Elavil, Neurontin. Also has been drinking alcohol and used cocaine last evening. Patient verbalized to EMS that she was trying to harm herself. Initial evaluation revealed hypokalemia.  Assessment/Plan: 1. Polysubstance overdose with Seroquel, Elavil, Neurontin without apparent sequela. Discussed with poison control this AM, reviewed data--poison control recommends medical clearance and no further evaluation. 2. Suicide attempt, method as above. 3. Alcohol intoxication on admission with elevated AST, resolved. 4. Hypokalemia, resolved.  5. Polysubstance abuse, UDS positive for cocaine, benzodiazepine's. Last used cocaine within 24 hours prior to admission. 6. COPD, asthma. Stable. 7. Anxiety, depression, PTSD. 8. Prolonged QTC, seen 10/2014 as well. 9. Hepatitis C according to laboratory studies from 09/15/2014. Recommend outpatient follow-up although treatment options will be limited as long as substance abuse persists.   Doing well,  No apparent sequela. After discussion with poison control, she is medically clear at this point. No further evaluation planned medically.   Psychiatry consultation placed this morning. Anticipate she will need inpatient treatment.   May transfer to medical floor.  Code Status: full code DVT prophylaxis: Lovenox Family Communication: none Disposition Plan: pending psychiatric evaluation.  Brendia Sacksaniel Goodrich, MD  [facility]  Pager [phone] If 7PM-7AM, please contact night-coverage at [email or web address], password [email or web address] 12/05/2014, 7:37 AM  LOS: 1 day   Consultants:  Psychiatry pending medical stability  Procedures:    Antibiotics:    HPI/Subjective: No issues charted  overnight. Discussed with RN, no issues overnight, patient doing well.  Patient slept well, has chronic pain, otherwise doing well. Very interested in psychiatric assistance.  Objective: Filed Vitals:   12/05/14 0027 12/05/14 0155 12/05/14 0404 12/05/14 0410  BP:  126/74  128/60  Pulse:  88  81  Temp: 97.6 F (36.4 C)  98.3 F (36.8 C)   TempSrc: Oral  Oral   Resp:  17  18  Height:      Weight:   86.4 kg (190 lb 7.6 oz)   SpO2:  97%  100%    Intake/Output Summary (Last 24 hours) at 12/05/14 0737 Last data filed at 12/05/14 0400  Gross per 24 hour  Intake 3361.25 ml  Output   1250 ml  Net 2111.25 ml     Filed Weights   12/05/14 0404  Weight: 86.4 kg (190 lb 7.6 oz)    Exam:     Afebrile, vital signs stable General:  Appears calm and comfortable Cardiovascular: RRR, no m/r/g.  Telemetry: SR, no arrhythmias  Respiratory: CTA bilaterally, no w/r/r. Normal respiratory effort. Psychiatric: grossly normal mood and affect, speech fluent and appropriate Neurologic: grossly non-focal.  New data reviewed:  UOP 1250  K+ normal 4.6  BMP unremarkable  AST now normal, LFTs unremarkable  Lactic acid within normal limits  CBC unremarkable  EKG last evening 1802: Sinus rhythm, right bundle branch block. No significant change compared to previous studies with the exception that QTc has normalized  Scheduled Meds: . enoxaparin (LOVENOX) injection  40 mg Subcutaneous Q24H  . folic acid  1 mg Oral Daily  . HYDROcodone-acetaminophen  1 tablet Oral Once  . ketorolac  30 mg Intravenous Once  . multivitamin with minerals  1 tablet Oral Daily  . potassium chloride  40 mEq Oral BID  . sodium chloride  3 mL Intravenous  Q12H  . thiamine  100 mg Oral Daily   Or  . thiamine  100 mg Intravenous Daily  . tiotropium  18 mcg Inhalation Daily   Continuous Infusions: . sodium chloride 75 mL/hr at 12/05/14 0400    Principal Problem:   Suicide attempt Active Problems:   COPD  (chronic obstructive pulmonary disease)   Polysubstance abuse   Cocaine abuse   Substance induced mood disorder   Alcohol dependence with alcohol-induced mood disorder   Overdose   Time spent 25 minutes

## 2014-12-06 DIAGNOSIS — T1491 Suicide attempt: Secondary | ICD-10-CM

## 2014-12-06 MED ORDER — LORAZEPAM 2 MG/ML IJ SOLN
2.0000 mg | Freq: Once | INTRAMUSCULAR | Status: AC
  Administered 2014-12-06: 2 mg via INTRAVENOUS
  Filled 2014-12-06: qty 1

## 2014-12-06 MED ORDER — FAMOTIDINE 20 MG PO TABS
20.0000 mg | ORAL_TABLET | Freq: Every day | ORAL | Status: DC
Start: 2014-12-06 — End: 2014-12-07
  Administered 2014-12-06 – 2014-12-07 (×2): 20 mg via ORAL
  Filled 2014-12-06 (×2): qty 1

## 2014-12-06 MED ORDER — LORAZEPAM 1 MG PO TABS
1.0000 mg | ORAL_TABLET | Freq: Once | ORAL | Status: AC
  Administered 2014-12-06: 1 mg via ORAL
  Filled 2014-12-06: qty 1

## 2014-12-06 MED ORDER — SODIUM CHLORIDE 0.9 % IV SOLN: INTRAVENOUS | Status: DC

## 2014-12-06 MED ORDER — ALUM & MAG HYDROXIDE-SIMETH 200-200-20 MG/5ML PO SUSP
30.0000 mL | Freq: Four times a day (QID) | ORAL | Status: DC | PRN
  Administered 2014-12-06: 30 mL via ORAL
  Filled 2014-12-06: qty 30

## 2014-12-06 MED ORDER — HYDROCODONE-ACETAMINOPHEN 5-325 MG PO TABS
2.0000 | ORAL_TABLET | Freq: Once | ORAL | Status: AC
  Administered 2014-12-06: 2 via ORAL
  Filled 2014-12-06: qty 2

## 2014-12-06 MED ORDER — IBUPROFEN 200 MG PO TABS
200.0000 mg | ORAL_TABLET | Freq: Four times a day (QID) | ORAL | Status: DC | PRN
  Administered 2014-12-06 – 2014-12-07 (×3): 200 mg via ORAL
  Filled 2014-12-06 (×4): qty 1

## 2014-12-06 NOTE — Progress Notes (Signed)
  PROGRESS NOTE  Marcha DuttonMelanie Creppel URK:[id number] DOB: 11/05/1965 DOA: 12/04/2014 PCP: PROVIDER NOT IN SYSTEM  Summary: 49 year old woman history of polysubstance abuse, by report found sleeping today outside behind a building. Someone called EMS. Reports she took a handful of Seroquel, Elavil, Neurontin. Also has been drinking alcohol and used cocaine last evening. Patient verbalized to EMS that she was trying to harm herself. Initial evaluation revealed hypokalemia.  Assessment/Plan: 1. Polysubstance overdose with Seroquel, Elavil, Neurontin without apparent sequela.  Patient mentation back to baseline, medically cleared for discharge to inpatient psych. 2. Suicide attempt, psychiatry consult appreciated, was threatening to leave AMA today, so IVC paper signed. Will need inpatient psych admission. 3. Alcohol intoxication on admission with elevated AST, resolved. 4. Hypokalemia, resolved.  5. Polysubstance abuse, UDS positive for cocaine, benzodiazepine's. Last used cocaine within 24 hours prior to admission. 6. COPD, asthma. Stable. 7. Anxiety, depression, PTSD. 8. Prolonged QTC, seen 10/2014 as well. 9. Hepatitis C according to laboratory studies from 09/15/2014. Recommend outpatient follow-up although treatment options will be limited as long as substance abuse persists.   Code Status: full code DVT prophylaxis: Lovenox Family Communication: none Disposition Plan: pending inpatient psychiatric bed availability.  Huey Bienenstockawood Yoana Staib, MD  [facility]  Pager [phone] If 7PM-7AM, please contact night-coverage at [email or web address], password [email or web address] 12/06/2014, 12:45 PM  LOS: 2 days   Consultants:  Psychiatry pending medical stability  Procedures:    Antibiotics:    HPI/Subjective: No issues charted overnight. Discussed with RN, no issues overnight, patient doing well.  Patient slept well, has chronic pain, otherwise doing well. Very interested in psychiatric  assistance.  Objective: Filed Vitals:   12/05/14 1200 12/05/14 2030 12/06/14 0500 12/06/14 1021  BP: 157/106 124/72 156/108   Pulse: 76 81 75   Temp: 97.8 F (36.6 C) 98.3 F (36.8 C) 97.7 F (36.5 C)   TempSrc:  Oral Oral   Resp: 14 15 16    Height:      Weight:      SpO2: 98% 100% 99% 100%    Intake/Output Summary (Last 24 hours) at 12/06/14 1245 Last data filed at 12/06/14 0940  Gross per 24 hour  Intake    480 ml  Output      0 ml  Net    480 ml     Filed Weights   12/05/14 0404  Weight: 86.4 kg (190 lb 7.6 oz)    Exam:     Afebrile, vital signs stable General:  Appears calm and comfortable Cardiovascular: RRR, no m/r/g.  Telemetry: SR, no arrhythmias  Respiratory: CTA bilaterally, no w/r/r. Normal respiratory effort. Psychiatric: grossly normal mood and affect, speech fluent and appropriate Neurologic: grossly non-focal.   Scheduled Meds: . enoxaparin (LOVENOX) injection  40 mg Subcutaneous Q24H  . folic acid  1 mg Oral Daily  . HYDROcodone-acetaminophen  1 tablet Oral Once  . multivitamin with minerals  1 tablet Oral Daily  . nicotine  21 mg Transdermal Daily  . sodium chloride  3 mL Intravenous Q12H  . thiamine  100 mg Oral Daily  . tiotropium  18 mcg Inhalation Daily   Continuous Infusions: . sodium chloride      Principal Problem:   Suicide attempt Active Problems:   COPD (chronic obstructive pulmonary disease)   Polysubstance abuse   Cocaine abuse   Substance induced mood disorder   Alcohol dependence with alcohol-induced mood disorder   Overdose   Time spent 25 minutes

## 2014-12-06 NOTE — Progress Notes (Addendum)
CSW assisting psych sl csw.  [facility]  CSW continuing to search for availability at psychiatric inpatient hospitals. Pt now under IVC.   Olga CoasterKristen Janin Kozlowski, LCSW  Clinical Social Work  [facility] [phone]
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
## 2014-12-07 ENCOUNTER — Inpatient Hospital Stay (HOSPITAL_COMMUNITY)
Admission: AD | Admit: 2014-12-07 | Discharge: 2014-12-08 | DRG: 885 | Disposition: A | Discharge status: Home / Self Care | Payer: Medicaid Other | Source: Intra-hospital | Attending: Psychiatry | Admitting: Psychiatry

## 2014-12-07 ENCOUNTER — Encounter (HOSPITAL_COMMUNITY): Payer: Self-pay | Admitting: *Deleted

## 2014-12-07 DIAGNOSIS — R45851 Suicidal ideations: Secondary | ICD-10-CM | POA: Diagnosis present

## 2014-12-07 DIAGNOSIS — F319 Bipolar disorder, unspecified: Secondary | ICD-10-CM | POA: Diagnosis present

## 2014-12-07 DIAGNOSIS — F3132 Bipolar disorder, current episode depressed, moderate: Secondary | ICD-10-CM | POA: Insufficient documentation

## 2014-12-07 DIAGNOSIS — T1491 Suicide attempt: Secondary | ICD-10-CM | POA: Diagnosis not present

## 2014-12-07 DIAGNOSIS — J449 Chronic obstructive pulmonary disease, unspecified: Secondary | ICD-10-CM | POA: Diagnosis present

## 2014-12-07 DIAGNOSIS — K219 Gastro-esophageal reflux disease without esophagitis: Secondary | ICD-10-CM | POA: Diagnosis present

## 2014-12-07 DIAGNOSIS — F1920 Other psychoactive substance dependence, uncomplicated: Secondary | ICD-10-CM | POA: Diagnosis present

## 2014-12-07 MED ORDER — GABAPENTIN 400 MG PO CAPS
400.0000 mg | ORAL_CAPSULE | Freq: Once | ORAL | Status: AC
  Administered 2014-12-07: 400 mg via ORAL
  Filled 2014-12-07 (×2): qty 1

## 2014-12-07 MED ORDER — GABAPENTIN 400 MG PO CAPS
400.0000 mg | ORAL_CAPSULE | Freq: Three times a day (TID) | ORAL | Status: DC
  Administered 2014-12-07 – 2014-12-08 (×3): 400 mg via ORAL
  Filled 2014-12-07: qty 1
  Filled 2014-12-07 (×2): qty 12
  Filled 2014-12-07 (×2): qty 1
  Filled 2014-12-07: qty 12
  Filled 2014-12-07 (×2): qty 1

## 2014-12-07 MED ORDER — FOLIC ACID 1 MG PO TABS: 1.0000 mg | ORAL_TABLET | Freq: Every day | ORAL | Status: DC

## 2014-12-07 MED ORDER — CALCIUM CARBONATE ANTACID 500 MG PO CHEW: 1.0000 | CHEWABLE_TABLET | Freq: Four times a day (QID) | ORAL | Status: DC | PRN

## 2014-12-07 MED ORDER — MAGNESIUM HYDROXIDE 400 MG/5ML PO SUSP: 30.0000 mL | Freq: Every day | ORAL | Status: DC | PRN

## 2014-12-07 MED ORDER — PANTOPRAZOLE SODIUM 40 MG PO TBEC
40.0000 mg | DELAYED_RELEASE_TABLET | Freq: Every day | ORAL | Status: DC
  Administered 2014-12-07 – 2014-12-08 (×2): 40 mg via ORAL
  Filled 2014-12-07 (×5): qty 1

## 2014-12-07 MED ORDER — ALBUTEROL SULFATE HFA 108 (90 BASE) MCG/ACT IN AERS
2.0000 | INHALATION_SPRAY | Freq: Four times a day (QID) | RESPIRATORY_TRACT | Status: DC | PRN
  Administered 2014-12-08 (×2): 2 via RESPIRATORY_TRACT
  Filled 2014-12-07: qty 6.7

## 2014-12-07 MED ORDER — THIAMINE HCL 100 MG PO TABS: 100.0000 mg | ORAL_TABLET | Freq: Every day | ORAL | Status: DC

## 2014-12-07 MED ORDER — ACETAMINOPHEN 325 MG PO TABS
650.0000 mg | ORAL_TABLET | Freq: Four times a day (QID) | ORAL | Status: DC | PRN
  Administered 2014-12-07 – 2014-12-08 (×3): 650 mg via ORAL
  Filled 2014-12-07 (×3): qty 2

## 2014-12-07 MED ORDER — METHOCARBAMOL 750 MG PO TABS
750.0000 mg | ORAL_TABLET | Freq: Three times a day (TID) | ORAL | Status: DC
  Administered 2014-12-07 – 2014-12-08 (×3): 750 mg via ORAL
  Filled 2014-12-07 (×8): qty 1

## 2014-12-07 MED ORDER — ALUM & MAG HYDROXIDE-SIMETH 200-200-20 MG/5ML PO SUSP
30.0000 mL | ORAL | Status: DC | PRN
  Administered 2014-12-07 – 2014-12-08 (×2): 30 mL via ORAL
  Filled 2014-12-07 (×2): qty 30

## 2014-12-07 MED ORDER — TRAZODONE HCL 50 MG PO TABS: 50.0000 mg | ORAL_TABLET | Freq: Every evening | ORAL | Status: DC | PRN

## 2014-12-07 MED ORDER — TIOTROPIUM BROMIDE MONOHYDRATE 18 MCG IN CAPS
18.0000 ug | ORAL_CAPSULE | Freq: Every day | RESPIRATORY_TRACT | Status: DC
  Administered 2014-12-08: 18 ug via RESPIRATORY_TRACT
  Filled 2014-12-07: qty 5

## 2014-12-07 MED ORDER — QUETIAPINE FUMARATE 50 MG PO TABS
150.0000 mg | ORAL_TABLET | Freq: Every day | ORAL | Status: DC
  Administered 2014-12-07: 150 mg via ORAL
  Filled 2014-12-07: qty 3
  Filled 2014-12-07: qty 12
  Filled 2014-12-07: qty 3

## 2014-12-07 MED ORDER — HYDROXYZINE HCL 50 MG PO TABS
50.0000 mg | ORAL_TABLET | Freq: Three times a day (TID) | ORAL | Status: DC
  Administered 2014-12-07 – 2014-12-08 (×3): 50 mg via ORAL
  Filled 2014-12-07 (×8): qty 1

## 2014-12-07 MED ORDER — AMITRIPTYLINE HCL 10 MG PO TABS
10.0000 mg | ORAL_TABLET | Freq: Every day | ORAL | Status: DC
  Administered 2014-12-07: 10 mg via ORAL
  Filled 2014-12-07: qty 1
  Filled 2014-12-07: qty 3
  Filled 2014-12-07: qty 1

## 2014-12-07 MED ORDER — LORAZEPAM 1 MG PO TABS: 1.0000 mg | ORAL_TABLET | Freq: Four times a day (QID) | ORAL | Status: DC | PRN

## 2014-12-07 MED ORDER — QUETIAPINE FUMARATE 50 MG PO TABS
150.0000 mg | ORAL_TABLET | Freq: Once | ORAL | Status: AC
  Administered 2014-12-07: 150 mg via ORAL
  Filled 2014-12-07 (×3): qty 1

## 2014-12-07 MED ORDER — ADULT MULTIVITAMIN W/MINERALS CH: 1.0000 | ORAL_TABLET | Freq: Every day | ORAL | Status: DC

## 2014-12-07 MED ORDER — MELOXICAM 7.5 MG PO TABS
7.5000 mg | ORAL_TABLET | Freq: Every day | ORAL | Status: DC
  Administered 2014-12-07 – 2014-12-08 (×2): 7.5 mg via ORAL
  Filled 2014-12-07: qty 1
  Filled 2014-12-07: qty 3
  Filled 2014-12-07 (×3): qty 1
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
## 2014-12-07 NOTE — Discharge Instructions (Signed)
Follow with Primary MD in 7 days   Get CBC, CMP, 2 view Chest X ray checked  by Primary MD next visit.    Activity: As tolerated with Full fall precautions use walker/cane & assistance as needed   Disposition [facility]   Diet: Heart Healthy  , with feeding assistance and aspiration precautions.  For Heart failure patients - Check your Weight same time everyday, if you gain over 2 pounds, or you develop in leg swelling, experience more shortness of breath or chest pain, call your Primary MD immediately. Follow Cardiac Low Salt Diet and 1.5 lit/day fluid restriction.   On your next visit with your primary care physician please Get Medicines reviewed and adjusted.   Please request your Prim.MD to go over all Hospital Tests and Procedure/Radiological results at the follow up, please get all Hospital records sent to your Prim MD by signing hospital release before you go home.   If you experience worsening of your admission symptoms, develop shortness of breath, life threatening emergency, suicidal or homicidal thoughts you must seek medical attention immediately by calling 911 or calling your MD immediately  if symptoms less severe.  You Must read complete instructions/literature along with all the possible adverse reactions/side effects for all the Medicines you take and that have been prescribed to you. Take any new Medicines after you have completely understood and accpet all the possible adverse reactions/side effects.   Do not drive, operating heavy machinery, perform activities at heights, swimming or participation in water activities or provide baby sitting services if your were admitted for syncope or siezures until you have seen by Primary MD or a Neurologist and advised to do so again.  Do not drive when taking Pain medications.    Do not take more than prescribed Pain, Sleep and Anxiety Medications  Special Instructions: If you have smoked or chewed Tobacco  in the last 2 yrs please  stop smoking, stop any regular Alcohol  and or any Recreational drug use.  Wear Seat belts while driving.   Please note  You were cared for by a hospitalist during your hospital stay. If you have any questions about your discharge medications or the care you received while you were in the hospital after you are discharged, you can call the unit and asked to speak with the hospitalist on call if the hospitalist that took care of you is not available. Once you are discharged, your primary care physician will handle any further medical issues. Please note that NO REFILLS for any discharge medications will be authorized once you are discharged, as it is imperative that you return to your primary care physician (or establish a relationship with a primary care physician if you do not have one) for your aftercare needs so that they can reassess your need for medications and monitor your lab values.

## 2014-12-07 NOTE — Progress Notes (Signed)
Patient ID: Charlotte Fisher, female   DOB: [birth year], 49 y.o.   MRN: [id number]  Pt currently presents with a flat affect and anxious behavior. Pt c/o pain in her stomach from "gas" and of being "tired." Pt asks to sleep through dinner tonight due to these somatic issues. Pt provided with medications per providers orders. Pt supported emotionally and encouraged to express concerns and questions. Pt educated on medications. Pt's safety ensured with 15 minute and environmental checks. Pt currently denies SI/HI and A/V hallucinations. Pt verbally agrees to seek staff if SI/HI or A/VH occurs and to consult with staff before acting on these thoughts. Will continue POC.

## 2014-12-07 NOTE — Progress Notes (Signed)
Patient accepted to [facility] room 302/2.  Spoke with unit and patient will be transferred in the morning. Rosey Bath, RN

## 2014-12-07 NOTE — Progress Notes (Signed)
CSW advised [facility] has a bed for patient- she is agreeable and understands she will need to be transported via [place] as she is under IVC.  CSW will coordinate arrangements- MD advised. Reece Levy, MSW, [facility] [phone]

## 2014-12-07 NOTE — [facility] Group Notes (Signed)
[facility] LCSW Group Therapy 12/07/2014  1:15 pm   Type of Therapy: Group Therapy Participation Level: Minimal  Participation Quality: Attentive  Affect: Depressed and Flat  Cognitive: Alert and Oriented  Insight: Developing/Improving and Engaged  Engagement in Therapy: Developing/Improving and Engaged  Modes of Intervention: Clarification, Confrontation, Discussion, Education, Exploration, Limit-setting, Orientation, Problem-solving, Rapport Building, Dance movement psychotherapist, Socialization and Support  Summary of Progress/Problems: The topic for group was balance in life. Today's group focused on defining balance in one's own words, identifying things that can knock one off balance, and exploring healthy ways to maintain balance in life. Group members were asked to provide an example of a time when they felt off balance, describe how they handled that situation,and process healthier ways to regain balance in the future. Group members were asked to share the most important tool for maintaining balance that they learned while at [facility] and how they plan to apply this method after discharge. Patient came into group late and was observed actively listening to group discussion but did not participate despite CSW encouragement.  Samuella Bruin, MSW, [employer] Clinical Social Worker [facility] [phone]

## 2014-12-08 ENCOUNTER — Encounter (HOSPITAL_COMMUNITY): Payer: Self-pay | Admitting: Psychiatry

## 2014-12-08 DIAGNOSIS — F3132 Bipolar disorder, current episode depressed, moderate: Secondary | ICD-10-CM

## 2014-12-08 MED ORDER — AMITRIPTYLINE HCL 10 MG PO TABS: 10.0000 mg | ORAL_TABLET | Freq: Every day | ORAL | Status: DC

## 2014-12-08 MED ORDER — ACAMPROSATE CALCIUM 333 MG PO TBEC: 666.0000 mg | DELAYED_RELEASE_TABLET | Freq: Three times a day (TID) | ORAL | Status: DC

## 2014-12-08 MED ORDER — PANTOPRAZOLE SODIUM 40 MG PO TBEC: 40.0000 mg | DELAYED_RELEASE_TABLET | Freq: Every day | ORAL | Status: DC

## 2014-12-08 MED ORDER — GABAPENTIN 400 MG PO CAPS: 400.0000 mg | ORAL_CAPSULE | Freq: Three times a day (TID) | ORAL | Status: DC

## 2014-12-08 MED ORDER — NICOTINE POLACRILEX 2 MG MT GUM: 2.0000 mg | CHEWING_GUM | OROMUCOSAL | Status: DC | PRN

## 2014-12-08 MED ORDER — QUETIAPINE FUMARATE 50 MG PO TABS
150.0000 mg | ORAL_TABLET | Freq: Every day | ORAL | Status: DC
Start: 2014-12-08 — End: 2015-03-18

## 2014-12-08 MED ORDER — ACAMPROSATE CALCIUM 333 MG PO TBEC
666.0000 mg | DELAYED_RELEASE_TABLET | Freq: Three times a day (TID) | ORAL | Status: DC
  Filled 2014-12-08: qty 2
  Filled 2014-12-08: qty 18
  Filled 2014-12-08: qty 2
  Filled 2014-12-08 (×2): qty 18
  Filled 2014-12-08 (×2): qty 2

## 2014-12-08 NOTE — Tx Team (Signed)
Initial Interdisciplinary Treatment Plan   PATIENT STRESSORS: Financial difficulties Health problems Traumatic event   PATIENT STRENGTHS: Average or above average intelligence General fund of knowledge Supportive family/friends   PROBLEM LIST: Problem List/Patient Goals Date to be addressed Date deferred Reason deferred Estimated date of resolution  "I don't have a problem I am going home tomorrow" 12/08/2014     Suicidal attempt 12/08/2014     finacial problems 12/08/2014     Substance abuse 12/08/2014     depression 12/08/2014                              DISCHARGE CRITERIA:  Adequate post-discharge living arrangements Improved stabilization in mood, thinking, and/or behavior Verbal commitment to aftercare and medication compliance  PRELIMINARY DISCHARGE PLAN: Outpatient therapy Return to previous living arrangement  PATIENT/FAMIILY INVOLVEMENT: This treatment plan has been presented to and reviewed with the patient, Charlotte Fisher.  The patient and family have been given the opportunity to ask questions and make suggestions.  JEHU-APPIAH, Livia Tarr K 12/08/2014, 6:54 AM

## 2014-12-08 NOTE — [facility] Group Notes (Signed)
[facility] LCSW Aftercare Discharge Planning Group Note  12/08/2014 8:45 AM  Pt did not attend, sleeping in room.  Chad Cordial, LCSWA 12/08/2014 9:56 AM

## 2014-12-08 NOTE — Progress Notes (Signed)
Patient ID: Charlotte Fisher, female   DOB: January 13, 1966, 49 y.o.   MRN: [id number] D: Pt report she did not want to kill self but trying to sleep. Pt stated she will discharge tomorrow and plans to move in with daughter for 3 months, save money and move out on her own. Pt interacting well with peers.  Pt denies SI/HI/AVH. Cooperative with assessment. No acute distressed noted at this time.   A: Met with pt 1:1. Medications administered as prescribed. Writer encouraged pt to discuss feelings. Pt encouraged to come to staff with any question or concerns.   R: Patient remains safe. She is complaint with medications and denies any adverse reaction.

## 2014-12-08 NOTE — Progress Notes (Signed)
  [facility] Adult Case Management Discharge Plan :  Will you be returning to the same living situation after discharge:  No. Pt will be going to live with her daughter in [place], [place] At discharge, do you have transportation home?: Yes,  Medicaid transportation scheduled Do you have the ability to pay for your medications: Yes,  Pt provided with 30-day prescriptions  Release of information consent forms completed and in the chart;  Patient's signature needed at discharge.  Patient to Follow up at: Follow-up Information    Follow up with [facility].   Why:  You may walk-in at anytime between 8am-3pm Monday-Friday to be set up with a doctor and a therapist.   Contact information:   [address] [place], [place] [address] [phone]      Patient denies SI/HI: Yes,  Pt denies    Safety Planning and Suicide Prevention discussed: Yes,  with daughter. See SPE note for further details  Have you used any form of tobacco in the last 30 days? (Cigarettes, Smokeless Tobacco, Cigars, and/or Pipes): Yes  Has patient been referred to the Quitline?: Patient refused referral  Elaina Hoops 12/08/2014, 11:33 AM

## 2014-12-08 NOTE — Progress Notes (Signed)
Recreation Therapy Notes  Date: 06.10.16 Time: 9:30 am Location: [address] Group Room  Group Topic: Stress Management  Goal Area(s) Addresses:  Patient will verbalize importance of using healthy stress management.  Patient will identify positive emotions associated with healthy stress management.   Intervention: Stress Management  Activity :  Progressive Muscle Relaxation.  LRT introduced and educated patients on stress management technique of progressive muscle relaxation.  A script was used to deliver the technique to patients.  Patients were asked to follow script read a loud by LRT to engage in practicing the stress management technique.  Education:  Stress Management, Discharge Planning.   Clinical Observations/Feedback: Patient did not attend group.   Clovis Mankins, LRT/CTRS         Chantell Kunkler A 12/08/2014 3:27 PM 

## 2014-12-08 NOTE — Plan of Care (Signed)
Problem: Diagnosis: Increased Risk For Suicide Attempt Goal: STG-Patient Will Attend All Groups On The Unit Outcome: Not Progressing Pt did not attend evening karaoke group.
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
## 2014-12-08 NOTE — [facility] Suicide Risk Assessment (Signed)
[facility] INPATIENT:  Family/Significant Other Suicide Prevention Education  Suicide Prevention Education:  Education Completed; Hospital doctor, Pt's daughter, has been identified by the patient as the family member/significant other with whom the patient will be residing, and identified as the person(s) who will aid the patient in the event of a mental health crisis (suicidal ideations/suicide attempt).  With written consent from the patient, the family member/significant other has been provided the following suicide prevention education, prior to the and/or following the discharge of the patient.  The suicide prevention education provided includes the following:  Suicide risk factors  Suicide prevention and interventions  National Suicide Hotline telephone number  [facility] assessment telephone number  [facility] Emergency Assistance 911  [facility] and/or Residential Mobile Crisis Unit telephone number  Request made of family/significant other to:  Remove weapons (e.g., guns, rifles, knives), all items previously/currently identified as safety concern.    Remove drugs/medications (over-the-counter, prescriptions, illicit drugs), all items previously/currently identified as a safety concern.  The family member/significant other verbalizes understanding of the suicide prevention education information provided.  The family member/significant other agrees to remove the items of safety concern listed above.  Elaina Hoops 12/08/2014, 10:43 AM
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
## 2014-12-08 NOTE — Progress Notes (Signed)
Pt did not attend karaoke in cafeteria.

## 2014-12-08 NOTE — Progress Notes (Signed)
D/C instructions/meds/follow-up appointments reviewed, pt verbalized understanding, pt's belongings returned to pt, samples given. 
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
## 2015-03-17 ENCOUNTER — Encounter (HOSPITAL_COMMUNITY): Payer: Self-pay | Admitting: Emergency Medicine

## 2015-03-17 ENCOUNTER — Emergency Department (HOSPITAL_COMMUNITY): Payer: Medicaid Other

## 2015-03-17 ENCOUNTER — Emergency Department (HOSPITAL_COMMUNITY)
Admission: EM | Admit: 2015-03-17 | Discharge: 2015-03-18 | Disposition: A | Discharge status: Home / Self Care | Payer: Medicaid Other | Attending: Emergency Medicine | Admitting: Emergency Medicine

## 2015-03-17 DIAGNOSIS — Y9389 Activity, other specified: Secondary | ICD-10-CM | POA: Insufficient documentation

## 2015-03-17 DIAGNOSIS — Z79899 Other long term (current) drug therapy: Secondary | ICD-10-CM | POA: Insufficient documentation

## 2015-03-17 DIAGNOSIS — Z720 Tobacco use: Secondary | ICD-10-CM | POA: Insufficient documentation

## 2015-03-17 DIAGNOSIS — N189 Chronic kidney disease, unspecified: Secondary | ICD-10-CM | POA: Diagnosis not present

## 2015-03-17 DIAGNOSIS — Y9289 Other specified places as the place of occurrence of the external cause: Secondary | ICD-10-CM | POA: Insufficient documentation

## 2015-03-17 DIAGNOSIS — S99921A Unspecified injury of right foot, initial encounter: Secondary | ICD-10-CM | POA: Diagnosis present

## 2015-03-17 DIAGNOSIS — F10120 Alcohol abuse with intoxication, uncomplicated: Secondary | ICD-10-CM | POA: Insufficient documentation

## 2015-03-17 DIAGNOSIS — J449 Chronic obstructive pulmonary disease, unspecified: Secondary | ICD-10-CM | POA: Insufficient documentation

## 2015-03-17 DIAGNOSIS — F329 Major depressive disorder, single episode, unspecified: Secondary | ICD-10-CM | POA: Insufficient documentation

## 2015-03-17 DIAGNOSIS — S92911A Unspecified fracture of right toe(s), initial encounter for closed fracture: Secondary | ICD-10-CM | POA: Insufficient documentation

## 2015-03-17 DIAGNOSIS — J45909 Unspecified asthma, uncomplicated: Secondary | ICD-10-CM | POA: Insufficient documentation

## 2015-03-17 DIAGNOSIS — Z8619 Personal history of other infectious and parasitic diseases: Secondary | ICD-10-CM | POA: Diagnosis not present

## 2015-03-17 DIAGNOSIS — Y998 Other external cause status: Secondary | ICD-10-CM | POA: Diagnosis not present

## 2015-03-17 DIAGNOSIS — S92919A Unspecified fracture of unspecified toe(s), initial encounter for closed fracture: Secondary | ICD-10-CM

## 2015-03-17 DIAGNOSIS — F10920 Alcohol use, unspecified with intoxication, uncomplicated: Secondary | ICD-10-CM

## 2015-03-17 DIAGNOSIS — F419 Anxiety disorder, unspecified: Secondary | ICD-10-CM | POA: Insufficient documentation

## 2015-03-17 LAB — COMPREHENSIVE METABOLIC PANEL
ALT: 45 U/L (ref 14–54)
AST: 53 U/L — ABNORMAL HIGH (ref 15–41)
Albumin: 3.9 g/dL (ref 3.5–5.0)
Alkaline Phosphatase: 87 U/L (ref 38–126)
Anion gap: 8 (ref 5–15)
BUN: 9 mg/dL (ref 6–20)
CALCIUM: 9.2 mg/dL (ref 8.9–10.3)
CHLORIDE: 106 mmol/L (ref 101–111)
CO2: 27 mmol/L (ref 22–32)
CREATININE: 1.08 mg/dL — AB (ref 0.44–1.00)
GFR calc Af Amer: >60 mL/min (ref >60)
GFR, EST NON AFRICAN AMERICAN: 59 mL/min — AB (ref >60)
Glucose, Bld: 115 mg/dL — ABNORMAL HIGH (ref 65–99)
Potassium: 3.3 mmol/L — ABNORMAL LOW (ref 3.5–5.1)
Sodium: 141 mmol/L (ref 135–145)
Total Bilirubin: 0.5 mg/dL (ref 0.3–1.2)
Total Protein: 8 g/dL (ref 6.5–8.1)

## 2015-03-17 LAB — CBC WITH DIFFERENTIAL/PLATELET
Basophils Absolute: 0.1 10*3/uL (ref 0.0–0.1)
Basophils Relative: 1 %
Eosinophils Absolute: 0.5 10*3/uL (ref 0.0–0.7)
Eosinophils Relative: 4 %
HCT: 44.7 % (ref 36.0–46.0)
HEMOGLOBIN: 14.7 g/dL (ref 12.0–15.0)
LYMPHS ABS: 5 10*3/uL — AB (ref 0.7–4.0)
LYMPHS PCT: 42 %
MCH: 32.4 pg (ref 26.0–34.0)
MCHC: 32.9 g/dL (ref 30.0–36.0)
MCV: 98.5 fL (ref 78.0–100.0)
Monocytes Absolute: 1 10*3/uL (ref 0.1–1.0)
Monocytes Relative: 8 %
NEUTROS ABS: 5.3 10*3/uL (ref 1.7–7.7)
NEUTROS PCT: 45 %
PLATELETS: 239 10*3/uL (ref 150–400)
RBC: 4.54 MIL/uL (ref 3.87–5.11)
RDW: 14.7 % (ref 11.5–15.5)
WBC: 11.8 10*3/uL — AB (ref 4.0–10.5)

## 2015-03-17 LAB — URINALYSIS, ROUTINE W REFLEX MICROSCOPIC
Bilirubin Urine: NEGATIVE
Glucose, UA: NEGATIVE mg/dL
HGB URINE DIPSTICK: NEGATIVE
Ketones, ur: NEGATIVE mg/dL
Nitrite: NEGATIVE
Protein, ur: NEGATIVE mg/dL
Specific Gravity, Urine: 1.007 (ref 1.005–1.030)
Urobilinogen, UA: 0.2 mg/dL (ref 0.0–1.0)
pH: 5.5 (ref 5.0–8.0)

## 2015-03-17 LAB — URINE MICROSCOPIC-ADD ON

## 2015-03-17 LAB — RAPID URINE DRUG SCREEN, HOSP PERFORMED
AMPHETAMINES: NOT DETECTED
BENZODIAZEPINES: POSITIVE — AB
Barbiturates: NOT DETECTED
Cocaine: NOT DETECTED
Opiates: NOT DETECTED
Tetrahydrocannabinol: NOT DETECTED

## 2015-03-17 LAB — I-STAT BETA HCG BLOOD, ED (MC, WL, AP ONLY)

## 2015-03-17 LAB — CBG MONITORING, ED: GLUCOSE-CAPILLARY: 106 mg/dL — AB (ref 65–99)

## 2015-03-17 LAB — ETHANOL: Alcohol, Ethyl (B): 191 mg/dL — ABNORMAL HIGH (ref <5)

## 2015-03-17 MED ORDER — LORAZEPAM 2 MG/ML IJ SOLN
1.0000 mg | Freq: Once | INTRAMUSCULAR | Status: AC
  Administered 2015-03-17: 1 mg via INTRAVENOUS
  Filled 2015-03-17: qty 1

## 2015-03-17 MED ORDER — MORPHINE SULFATE (PF) 4 MG/ML IV SOLN
4.0000 mg | Freq: Once | INTRAVENOUS | Status: AC
  Administered 2015-03-17: 4 mg via INTRAVENOUS
  Filled 2015-03-17: qty 1
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
## 2015-03-17 NOTE — ED Notes (Signed)
Nurse drawing labs. 
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
## 2015-03-18 MED ORDER — TRAMADOL HCL 50 MG PO TABS: 50.0000 mg | ORAL_TABLET | Freq: Four times a day (QID) | ORAL | Status: DC | PRN

## 2015-03-18 MED ORDER — ALBUTEROL SULFATE (2.5 MG/3ML) 0.083% IN NEBU
5.0000 mg | INHALATION_SOLUTION | Freq: Once | RESPIRATORY_TRACT | Status: AC
  Administered 2015-03-18: 5 mg via RESPIRATORY_TRACT
  Filled 2015-03-18: qty 6

## 2015-03-18 MED ORDER — ALBUTEROL SULFATE HFA 108 (90 BASE) MCG/ACT IN AERS: 1.0000 | INHALATION_SPRAY | Freq: Four times a day (QID) | RESPIRATORY_TRACT | Status: DC | PRN

## 2015-03-18 MED ORDER — QUETIAPINE FUMARATE 50 MG PO TABS: 50.0000 mg | ORAL_TABLET | Freq: Every day | ORAL | Status: DC

## 2015-03-18 MED ORDER — QUETIAPINE FUMARATE 50 MG PO TABS: 150.0000 mg | ORAL_TABLET | Freq: Every day | ORAL | Status: DC

## 2015-03-18 MED ORDER — AMITRIPTYLINE HCL 10 MG PO TABS: 10.0000 mg | ORAL_TABLET | Freq: Every day | ORAL | Status: DC

## 2015-03-18 MED ORDER — HYDROXYZINE HCL 25 MG PO TABS: 50.0000 mg | ORAL_TABLET | Freq: Three times a day (TID) | ORAL | Status: DC | PRN

## 2015-03-18 MED ORDER — IBUPROFEN 800 MG PO TABS
800.0000 mg | ORAL_TABLET | Freq: Once | ORAL | Status: AC
  Administered 2015-03-18: 800 mg via ORAL
  Filled 2015-03-18: qty 1

## 2015-03-18 MED ORDER — IPRATROPIUM-ALBUTEROL 18-103 MCG/ACT IN AERO: 2.0000 | INHALATION_SPRAY | RESPIRATORY_TRACT | Status: DC | PRN

## 2015-03-18 MED ORDER — GABAPENTIN 400 MG PO CAPS: 400.0000 mg | ORAL_CAPSULE | Freq: Three times a day (TID) | ORAL | Status: DC

## 2015-03-18 NOTE — Discharge Instructions (Signed)
Toe Fracture Your caregiver has diagnosed you as having a fractured toe. A toe fracture is a break in the bone of a toe. "Buddy taping" is a way of splinting your broken toe, by taping the broken toe to the toe next to it. This "buddy taping" will keep the injured toe from moving beyond normal range of motion. Buddy taping also helps the toe heal in a more normal alignment. It may take 6 to 8 weeks for the toe injury to heal. HOME CARE INSTRUCTIONS   Leave your toes taped together for as long as directed by your caregiver or until you see a doctor for a follow-up examination. You can change the tape after bathing. Always use a small piece of gauze or cotton between the toes when taping them together. This will help the skin stay dry and prevent infection.  Apply ice to the injury for 15-20 minutes each hour while awake for the first 2 days. Put the ice in a plastic bag and place a towel between the bag of ice and your skin.  After the first 2 days, apply heat to the injured area. Use heat for the next 2 to 3 days. Place a heating pad on the foot or soak the foot in warm water as directed by your caregiver.  Keep your foot elevated as much as possible to lessen swelling.  Wear sturdy, supportive shoes. The shoes should not pinch the toes or fit tightly against the toes.  Your caregiver may prescribe a rigid shoe if your foot is very swollen.  Your may be given crutches if the pain is too great and it hurts too much to walk.  Only take over-the-counter or prescription medicines for pain, discomfort, or fever as directed by your caregiver.  If your caregiver has given you a follow-up appointment, it is very important to keep that appointment. Not keeping the appointment could result in a chronic or permanent injury, pain, and disability. If there is any problem keeping the appointment, you must call back to this facility for assistance. SEEK MEDICAL CARE IF:   You have increased pain or swelling,  not relieved with medications.  The pain does not get better after 1 week.  Your injured toe is cold when the others are warm. SEEK IMMEDIATE MEDICAL CARE IF:   The toe becomes cold, numb, or white.  The toe becomes hot (inflamed) and red. Document Released: 06/13/2000 Document Revised: 09/08/2011 Document Reviewed: 01/31/2008 [facility] Patient Information 2015 [place], [place]. This information is not intended to replace advice given to you by your health care provider. Make sure you discuss any questions you have with your health care provider.  Alcohol Intoxication Alcohol intoxication occurs when the amount of alcohol that a person has consumed impairs his or her ability to mentally and physically function. Alcohol directly impairs the normal chemical activity of the brain. Drinking large amounts of alcohol can lead to changes in mental function and behavior, and it can cause many physical effects that can be harmful.  Alcohol intoxication can range in severity from mild to very severe. Various factors can affect the level of intoxication that occurs, such as the person's age, gender, weight, frequency of alcohol consumption, and the presence of other medical conditions (such as diabetes, seizures, or heart conditions). Dangerous levels of alcohol intoxication may occur when people drink large amounts of alcohol in a short period (binge drinking). Alcohol can also be especially dangerous when combined with certain prescription medicines or "recreational" drugs.  SIGNS AND SYMPTOMS Some common signs and symptoms of mild alcohol intoxication include:  Loss of coordination.  Changes in mood and behavior.  Impaired judgment.  Slurred speech. As alcohol intoxication progresses to more severe levels, other signs and symptoms will appear. These may include:  Vomiting.  Confusion and impaired memory.  Slowed breathing.  Seizures.  Loss of consciousness. DIAGNOSIS  Your health care  provider will take a medical history and perform a physical exam. You will be asked about the amount and type of alcohol you have consumed. Blood tests will be done to measure the concentration of alcohol in your blood. In many places, your blood alcohol level must be lower than 80 mg/dL (9.56%) to legally drive. However, many dangerous effects of alcohol can occur at much lower levels.  TREATMENT  People with alcohol intoxication often do not require treatment. Most of the effects of alcohol intoxication are temporary, and they go away as the alcohol naturally leaves the body. Your health care provider will monitor your condition until you are stable enough to go home. Fluids are sometimes given through an IV access tube to help prevent dehydration.  HOME CARE INSTRUCTIONS  Do not drive after drinking alcohol.  Stay hydrated. Drink enough water and fluids to keep your urine clear or pale yellow. Avoid caffeine.   Only take over-the-counter or prescription medicines as directed by your health care provider.  SEEK MEDICAL CARE IF:   You have persistent vomiting.   You do not feel better after a few days.  You have frequent alcohol intoxication. Your health care provider can help determine if you should see a substance use treatment counselor. SEEK IMMEDIATE MEDICAL CARE IF:   You become shaky or tremble when you try to stop drinking.   You shake uncontrollably (seizure).   You throw up (vomit) blood. This may be bright red or may look like black coffee grounds.   You have blood in your stool. This may be bright red or may appear as a black, tarry, bad smelling stool.   You become lightheaded or faint.  MAKE SURE YOU:   Understand these instructions.  Will watch your condition.  Will get help right away if you are not doing well or get worse. Document Released: 03/26/2005 Document Revised: 02/16/2013 Document Reviewed: 11/19/2012 [facility] Patient Information 2015 [place],  [place]. This information is not intended to replace advice given to you by your health care provider. Make sure you discuss any questions you have with your health care provider.

## 2015-03-18 NOTE — ED Notes (Signed)
Pt able to ambulate independently to the bathroom---- gait and balance steady.
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
## 2015-12-13 ENCOUNTER — Encounter (HOSPITAL_COMMUNITY): Payer: Self-pay

## 2015-12-13 ENCOUNTER — Emergency Department (HOSPITAL_COMMUNITY): Payer: Medicaid Other

## 2015-12-13 ENCOUNTER — Emergency Department (HOSPITAL_COMMUNITY)
Admission: EM | Admit: 2015-12-13 | Discharge: 2015-12-14 | Disposition: A | Discharge status: Home / Self Care | Payer: Medicaid Other | Attending: Emergency Medicine | Admitting: Emergency Medicine

## 2015-12-13 DIAGNOSIS — F17200 Nicotine dependence, unspecified, uncomplicated: Secondary | ICD-10-CM | POA: Insufficient documentation

## 2015-12-13 DIAGNOSIS — R0602 Shortness of breath: Secondary | ICD-10-CM

## 2015-12-13 DIAGNOSIS — F1090 Alcohol use, unspecified, uncomplicated: Secondary | ICD-10-CM

## 2015-12-13 DIAGNOSIS — Y999 Unspecified external cause status: Secondary | ICD-10-CM | POA: Insufficient documentation

## 2015-12-13 DIAGNOSIS — Z7289 Other problems related to lifestyle: Secondary | ICD-10-CM

## 2015-12-13 DIAGNOSIS — J449 Chronic obstructive pulmonary disease, unspecified: Secondary | ICD-10-CM | POA: Insufficient documentation

## 2015-12-13 DIAGNOSIS — F410 Panic disorder [episodic paroxysmal anxiety] without agoraphobia: Secondary | ICD-10-CM

## 2015-12-13 DIAGNOSIS — S00511A Abrasion of lip, initial encounter: Secondary | ICD-10-CM | POA: Diagnosis not present

## 2015-12-13 DIAGNOSIS — F418 Other specified anxiety disorders: Secondary | ICD-10-CM | POA: Insufficient documentation

## 2015-12-13 DIAGNOSIS — Z760 Encounter for issue of repeat prescription: Secondary | ICD-10-CM

## 2015-12-13 DIAGNOSIS — F1099 Alcohol use, unspecified with unspecified alcohol-induced disorder: Secondary | ICD-10-CM | POA: Insufficient documentation

## 2015-12-13 DIAGNOSIS — I4510 Unspecified right bundle-branch block: Secondary | ICD-10-CM

## 2015-12-13 DIAGNOSIS — R0789 Other chest pain: Secondary | ICD-10-CM

## 2015-12-13 DIAGNOSIS — N189 Chronic kidney disease, unspecified: Secondary | ICD-10-CM | POA: Insufficient documentation

## 2015-12-13 DIAGNOSIS — Y939 Activity, unspecified: Secondary | ICD-10-CM | POA: Diagnosis not present

## 2015-12-13 DIAGNOSIS — Y929 Unspecified place or not applicable: Secondary | ICD-10-CM | POA: Insufficient documentation

## 2015-12-13 DIAGNOSIS — Z789 Other specified health status: Secondary | ICD-10-CM

## 2015-12-13 DIAGNOSIS — R9431 Abnormal electrocardiogram [ECG] [EKG]: Secondary | ICD-10-CM

## 2015-12-13 DIAGNOSIS — Z720 Tobacco use: Secondary | ICD-10-CM

## 2015-12-13 LAB — CBC WITH DIFFERENTIAL/PLATELET
BASOS PCT: 1 %
Basophils Absolute: 0.1 10*3/uL (ref 0.0–0.1)
EOS ABS: 0.4 10*3/uL (ref 0.0–0.7)
Eosinophils Relative: 4 %
HCT: 36.1 % (ref 36.0–46.0)
HEMOGLOBIN: 12.2 g/dL (ref 12.0–15.0)
LYMPHS ABS: 3.7 10*3/uL (ref 0.7–4.0)
Lymphocytes Relative: 37 %
MCH: 29.9 pg (ref 26.0–34.0)
MCHC: 33.8 g/dL (ref 30.0–36.0)
MCV: 88.5 fL (ref 78.0–100.0)
Monocytes Absolute: 0.6 10*3/uL (ref 0.1–1.0)
Monocytes Relative: 6 %
NEUTROS PCT: 52 %
Neutro Abs: 5.2 10*3/uL (ref 1.7–7.7)
Platelets: 229 10*3/uL (ref 150–400)
RBC: 4.08 MIL/uL (ref 3.87–5.11)
RDW: 14.2 % (ref 11.5–15.5)
WBC: 10 10*3/uL (ref 4.0–10.5)

## 2015-12-13 LAB — BASIC METABOLIC PANEL
Anion gap: 9 (ref 5–15)
BUN: 12 mg/dL (ref 6–20)
CHLORIDE: 108 mmol/L (ref 101–111)
CO2: 20 mmol/L — ABNORMAL LOW (ref 22–32)
CREATININE: 0.79 mg/dL (ref 0.44–1.00)
Calcium: 8.7 mg/dL — ABNORMAL LOW (ref 8.9–10.3)
GFR calc non Af Amer: >60 mL/min (ref >60)
Glucose, Bld: 98 mg/dL (ref 65–99)
POTASSIUM: 3.3 mmol/L — AB (ref 3.5–5.1)
SODIUM: 137 mmol/L (ref 135–145)

## 2015-12-13 LAB — I-STAT TROPONIN, ED: TROPONIN I, POC: 0 ng/mL (ref 0.00–0.08)

## 2015-12-13 LAB — ETHANOL: Alcohol, Ethyl (B): <5 mg/dL (ref <5)

## 2015-12-13 MED ORDER — HYDROXYZINE HCL 50 MG PO TABS: 50.0000 mg | ORAL_TABLET | Freq: Three times a day (TID) | ORAL | Status: DC | PRN

## 2015-12-13 MED ORDER — AMITRIPTYLINE HCL 10 MG PO TABS: 10.0000 mg | ORAL_TABLET | Freq: Every day | ORAL | Status: DC

## 2015-12-13 MED ORDER — GABAPENTIN 400 MG PO CAPS: 400.0000 mg | ORAL_CAPSULE | Freq: Three times a day (TID) | ORAL | Status: DC

## 2015-12-13 MED ORDER — QUETIAPINE FUMARATE 50 MG PO TABS: 150.0000 mg | ORAL_TABLET | Freq: Every day | ORAL | Status: DC

## 2015-12-13 MED ORDER — HYDROXYZINE HCL 25 MG PO TABS
50.0000 mg | ORAL_TABLET | Freq: Once | ORAL | Status: AC
  Administered 2015-12-13: 50 mg via ORAL
  Filled 2015-12-13: qty 2

## 2015-12-13 NOTE — ED Notes (Signed)
Gave updated EKG to Dr. Particia NearingHaviland.
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
## 2015-12-13 NOTE — ED Notes (Signed)
Bed: WA28 Expected date:  Expected time:  Means of arrival:  Comments: EMS-anxiety 
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
## 2015-12-14 MED ORDER — IBUPROFEN 200 MG PO TABS
600.0000 mg | ORAL_TABLET | Freq: Once | ORAL | Status: AC
  Administered 2015-12-14: 600 mg via ORAL
  Filled 2015-12-14: qty 3

## 2015-12-14 MED ORDER — ACETAMINOPHEN 325 MG PO TABS
650.0000 mg | ORAL_TABLET | Freq: Once | ORAL | Status: AC
  Administered 2015-12-14: 650 mg via ORAL
  Filled 2015-12-14: qty 2

## 2015-12-14 MED ORDER — HYDROXYZINE HCL 25 MG PO TABS
50.0000 mg | ORAL_TABLET | Freq: Once | ORAL | Status: AC
  Administered 2015-12-14: 50 mg via ORAL
  Filled 2015-12-14: qty 2

## 2015-12-14 MED ORDER — IBUPROFEN 200 MG PO TABS
400.0000 mg | ORAL_TABLET | Freq: Once | ORAL | Status: AC
  Administered 2015-12-14: 400 mg via ORAL
  Filled 2015-12-14: qty 2

## 2015-12-14 NOTE — ED Notes (Signed)
[facility]. Pt given bus pass to get to services.

## 2015-12-14 NOTE — ED Notes (Signed)
Social work at bedside.  

## 2015-12-14 NOTE — ED Notes (Signed)
SANE RN arrived.

## 2015-12-14 NOTE — ED Notes (Signed)
SANE RN called for resources for housing tonight. States that she will be here in 1 hour.

## 2015-12-14 NOTE — Discharge Instructions (Signed)
Your work up today is reassuring, your symptoms are likely from anxiety. Your psychiatric/anxiety medications have been refilled today as a courtesy, but your regular doctor should be the one to refill them regularly. Take your anxiety medications as directed. Take all home medications as prescribed. Use antibiotic ointment and a bandage on your abrasion, use ice to the area to help with pain and swelling, use tylenol and/or motrin as needed for pain. Use your home inhaler as directed as needed for shortness of breath/chest tightness. Use the list of resources below to find a shelter. STOP SMOKING AND DRINKING ALCOHOL! Follow up with your regular doctor listed on your medicaid card in 1 week for ongoing management of your anxiety and future medication refills. Return to the ER for changes or worsening symptoms.   Nonspecific Chest Pain  Chest pain can be caused by many different conditions. There is always a chance that your pain could be related to something serious, such as a heart attack or a blood clot in your lungs. Chest pain can also be caused by conditions that are not life-threatening. If you have chest pain, it is very important to follow up with your health care provider. CAUSES  Chest pain can be caused by:  Heartburn.  Pneumonia or bronchitis.  Anxiety or stress.  Inflammation around your heart (pericarditis) or lung (pleuritis or pleurisy).  A blood clot in your lung.  A collapsed lung (pneumothorax). It can develop suddenly on its own (spontaneous pneumothorax) or from trauma to the chest.  Shingles infection (varicella-zoster virus).  Heart attack.  Damage to the bones, muscles, and cartilage that make up your chest wall. This can include:  Bruised bones due to injury.  Strained muscles or cartilage due to frequent or repeated coughing or overwork.  Fracture to one or more ribs.  Sore cartilage due to inflammation (costochondritis). RISK FACTORS  Risk factors for  chest pain may include:  Activities that increase your risk for trauma or injury to your chest.  Respiratory infections or conditions that cause frequent coughing.  Medical conditions or overeating that can cause heartburn.  Heart disease or family history of heart disease.  Conditions or health behaviors that increase your risk of developing a blood clot.  Having had chicken pox (varicella zoster). SIGNS AND SYMPTOMS Chest pain can feel like:  Burning or tingling on the surface of your chest or deep in your chest.  Crushing, pressure, aching, or squeezing pain.  Dull or sharp pain that is worse when you move, cough, or take a deep breath.  Pain that is also felt in your back, neck, shoulder, or arm, or pain that spreads to any of these areas. Your chest pain may come and go, or it may stay constant. DIAGNOSIS Lab tests or other studies may be needed to find the cause of your pain. Your health care provider may have you take a test called an ambulatory ECG (electrocardiogram). An ECG records your heartbeat patterns at the time the test is performed. You may also have other tests, such as:  Transthoracic echocardiogram (TTE). During echocardiography, sound waves are used to create a picture of all of the heart structures and to look at how blood flows through your heart.  Transesophageal echocardiogram (TEE).This is a more advanced imaging test that obtains images from inside your body. It allows your health care provider to see your heart in finer detail.  Cardiac monitoring. This allows your health care provider to monitor your heart rate and rhythm  in real time.  Holter monitor. This is a portable device that records your heartbeat and can help to diagnose abnormal heartbeats. It allows your health care provider to track your heart activity for several days, if needed.  Stress tests. These can be done through exercise or by taking medicine that makes your heart beat more  quickly.  Blood tests.  Imaging tests. TREATMENT  Your treatment depends on what is causing your chest pain. Treatment may include:  Medicines. These may include:  Acid blockers for heartburn.  Anti-inflammatory medicine.  Pain medicine for inflammatory conditions.  Antibiotic medicine, if an infection is present.  Medicines to dissolve blood clots.  Medicines to treat coronary artery disease.  Supportive care for conditions that do not require medicines. This may include:  Resting.  Applying heat or cold packs to injured areas.  Limiting activities until pain decreases. HOME CARE INSTRUCTIONS  If you were prescribed an antibiotic medicine, finish it all even if you start to feel better.  Avoid any activities that bring on chest pain.  Do not use any tobacco products, including cigarettes, chewing tobacco, or electronic cigarettes. If you need help quitting, ask your health care provider.  Do not drink alcohol.  Take medicines only as directed by your health care provider.  Keep all follow-up visits as directed by your health care provider. This is important. This includes any further testing if your chest pain does not go away.  If heartburn is the cause for your chest pain, you may be told to keep your head raised (elevated) while sleeping. This reduces the chance that acid will go from your stomach into your esophagus.  Make lifestyle changes as directed by your health care provider. These may include:  Getting regular exercise. Ask your health care provider to suggest some activities that are safe for you.  Eating a heart-healthy diet. A registered dietitian can help you to learn healthy eating options.  Maintaining a healthy weight.  Managing diabetes, if necessary.  Reducing stress. SEEK MEDICAL CARE IF:  Your chest pain does not go away after treatment.  You have a rash with blisters on your chest.  You have a fever. SEEK IMMEDIATE MEDICAL CARE  IF:   Your chest pain is worse.  You have an increasing cough, or you cough up blood.  You have severe abdominal pain.  You have severe weakness.  You faint.  You have chills.  You have sudden, unexplained chest discomfort.  You have sudden, unexplained discomfort in your arms, back, neck, or jaw.  You have shortness of breath at any time.  You suddenly start to sweat, or your skin gets clammy.  You feel nauseous or you vomit.  You suddenly feel light-headed or dizzy.  Your heart begins to beat quickly, or it feels like it is skipping beats. These symptoms may represent a serious problem that is an emergency. Do not wait to see if the symptoms will go away. Get medical help right away. Call your local emergency services (911 in the [place] Do not drive yourself to the hospital.   This information is not intended to replace advice given to you by your health care provider. Make sure you discuss any questions you have with your health care provider.   Document Released: 03/26/2005 Document Revised: 07/07/2014 Document Reviewed: 01/20/2014 Elsevier Interactive Patient Education 2016 [employer].  Shortness of Breath Shortness of breath means you have trouble breathing. It could also mean that you have a medical problem. You  should get immediate medical care for shortness of breath. CAUSES   Not enough oxygen in the air such as with high altitudes or a smoke-filled room.  Certain lung diseases, infections, or problems.  Heart disease or conditions, such as angina or heart failure.  Low red blood cells (anemia).  Poor physical fitness, which can cause shortness of breath when you exercise.  Chest or back injuries or stiffness.  Being overweight.  Smoking.  Anxiety, which can make you feel like you are not getting enough air. DIAGNOSIS  Serious medical problems can often be found during your physical exam. Tests may also be done to determine why you are having  shortness of breath. Tests may include:  Chest X-rays.  Lung function tests.  Blood tests.  An electrocardiogram (ECG).  An ambulatory electrocardiogram. An ambulatory ECG records your heartbeat patterns over a 24-hour period.  Exercise testing.  A transthoracic echocardiogram (TTE). During echocardiography, sound waves are used to evaluate how blood flows through your heart.  A transesophageal echocardiogram (TEE).  Imaging scans. Your health care provider may not be able to find a cause for your shortness of breath after your exam. In this case, it is important to have a follow-up exam with your health care provider as directed.  TREATMENT  Treatment for shortness of breath depends on the cause of your symptoms and can vary greatly. HOME CARE INSTRUCTIONS   Do not smoke. Smoking is a common cause of shortness of breath. If you smoke, ask for help to quit.  Avoid being around chemicals or things that may bother your breathing, such as paint fumes and dust.  Rest as needed. Slowly resume your usual activities.  If medicines were prescribed, take them as directed for the full length of time directed. This includes oxygen and any inhaled medicines.  Keep all follow-up appointments as directed by your health care provider. SEEK MEDICAL CARE IF:   Your condition does not improve in the time expected.  You have a hard time doing your normal activities even with rest.  You have any new symptoms. SEEK IMMEDIATE MEDICAL CARE IF:   Your shortness of breath gets worse.  You feel light-headed, faint, or develop a cough not controlled with medicines.  You start coughing up blood.  You have pain with breathing.  You have chest pain or pain in your arms, shoulders, or abdomen.  You have a fever.  You are unable to walk up stairs or exercise the way you normally do. MAKE SURE YOU:  Understand these instructions.  Will watch your condition.  Will get help right away if  you are not doing well or get worse.   This information is not intended to replace advice given to you by your health care provider. Make sure you discuss any questions you have with your health care provider.   Document Released: 03/11/2001 Document Revised: 06/21/2013 Document Reviewed: 09/01/2011 Elsevier Interactive Patient Education 2016 Else[employer] Inc[employer]  Panic Attacks Panic attacks are sudden, short feelings of great fear or discomfort. You may have them for no reason when you are relaxed, when you are uneasy (anxious), or when you are sleeping.  HOME CARE  Take all your medicines as told.  Check with your doctor before starting new medicines.  Keep all doctor visits. GET HELP IF:  You are not able to take your medicines as told.  Your symptoms do not get better.  Your symptoms get worse. GET HELP RIGHT AWAY IF:  Your attacks seem  different than your normal attacks.  You have thoughts about hurting yourself or others.  You take panic attack medicine and you have a side effect. MAKE SURE YOU:  Understand these instructions.  Will watch your condition.  Will get help right away if you are not doing well or get worse.   This information is not intended to replace advice given to you by your health care provider. Make sure you discuss any questions you have with your health care provider.   Document Released: 07/19/2010 Document Revised: 04/06/2013 Document Reviewed: 01/28/2013 Elsevier Interactive Patient Education 2016 Elsevier Inc.   Abrasion An abrasion is a cut or scrape on the surface of your skin. An abrasion does not go through all of the layers of your skin. It is important to take good care of your abrasion to prevent infection. HOME CARE Medicines  Take or apply medicines only as told by your doctor.  If you were prescribed an antibiotic ointment, finish all of it even if you start to feel better. Wound Care  Clean the wound with mild soap and water  2-3 times per day or as told by your doctor. Pat your wound dry with a clean towel. Do not rub it.  There are many ways to close and cover a wound. Follow instructions from your doctor about:  How to take care of your wound.  When and how you should change your bandage (dressing).  When and how you should take off your dressing.  Check your wound every day for signs of infection. Watch for:  Redness, swelling, or pain.  Fluid, blood, or pus. General Instructions  Keep the dressing dry as told by your doctor. Do not take baths, swim, use a hot tub, or do anything that would put your wound underwater until your doctor says it is okay.  If there is swelling, raise (elevate) the injured area above the level of your heart while you are sitting or lying down.  Keep all follow-up visits as told by your doctor. This is important. GET HELP IF:  You were given a tetanus shot and you have any of these where the needle went in:  Swelling.  Very bad pain.  Redness.  Bleeding.  Medicine does not help your pain.  You have any of these at the site of the wound:  More redness.  More swelling.  More pain. GET HELP RIGHT AWAY IF:  You have a red streak going away from your wound.  You have a fever.  You have fluid, blood, or pus coming from your wound.  There is a bad smell coming from your wound.   This information is not intended to replace advice given to you by your health care provider. Make sure you discuss any questions you have with your health care provider.   Document Released: 12/03/2007 Document Revised: 10/31/2014 Document Reviewed: 06/14/2014 Elsevier Interactive Patient Education 2016 Elsevier Inc. Wound Care Taking care of your wound properly can help to prevent pain and infection. It can also help your wound to heal more quickly.  HOW TO CARE FOR YOUR WOUND  Take or apply over-the-counter and prescription medicines only as told by your health care  provider.  If you were prescribed antibiotic medicine, take or apply it as told by your health care provider. Do not stop using the antibiotic even if your condition improves.  Clean the wound each day or as told by your health care provider.  Wash the wound with mild soap  and water.  Rinse the wound with water to remove all soap.  Pat the wound dry with a clean towel. Do not rub it.  There are many different ways to close and cover a wound. For example, a wound can be covered with stitches (sutures), skin glue, or adhesive strips. Follow instructions from your health care provider about:  How to take care of your wound.  When and how you should change your bandage (dressing).  When you should remove your dressing.  Removing whatever was used to close your wound.  Check your wound every day for signs of infection. Watch for:  Redness, swelling, or pain.  Fluid, blood, or pus.  Keep the dressing dry until your health care provider says it can be removed. Do not take baths, swim, use a hot tub, or do anything that would put your wound underwater until your health care provider approves.  Raise (elevate) the injured area above the level of your heart while you are sitting or lying down.  Do not scratch or pick at the wound.  Keep all follow-up visits as told by your health care provider. This is important. SEEK MEDICAL CARE IF:  You received a tetanus shot and you have swelling, severe pain, redness, or bleeding at the injection site.  You have a fever.  Your pain is not controlled with medicine.  You have increased redness, swelling, or pain at the site of your wound.  You have fluid, blood, or pus coming from your wound.  You notice a bad smell coming from your wound or your dressing. SEEK IMMEDIATE MEDICAL CARE IF:  You have a red streak going away from your wound.   This information is not intended to replace advice given to you by your health care provider. Make  sure you discuss any questions you have with your health care provider.   Document Released: 03/25/2008 Document Revised: 10/31/2014 Document Reviewed: 06/12/2014 Elsevier Interactive Patient Education [employer]  Medicine Refill at the Emergency Department We have refilled your medicine today, but it is best for you to get refills through your primary health care provider's office. In the future, please plan ahead so you do not need to get refills from the emergency department. If the medicine we refilled was a maintenance medicine, you may have received only enough to get you by until you are able to see your regular health care provider.   This information is not intended to replace advice given to you by your health care provider. Make sure you discuss any questions you have with your health care provider.   Document Released: 10/03/2003 Document Revised: 07/07/2014 Document Reviewed: 09/23/2013 Elsevier Interactive Patient Education 2016 [employer].   Smoking Cessation, Tips for Success If you are ready to quit smoking, congratulations! You have chosen to help yourself be healthier. Cigarettes bring nicotine, tar, carbon monoxide, and other irritants into your body. Your lungs, heart, and blood vessels will be able to work better without these poisons. There are many different ways to quit smoking. Nicotine gum, nicotine patches, a nicotine inhaler, or nicotine nasal spray can help with physical craving. Hypnosis, support groups, and medicines help break the habit of smoking. WHAT THINGS CAN I DO TO MAKE QUITTING EASIER?  Here are some tips to help you quit for good:  Pick a date when you will quit smoking completely. Tell all of your friends and family about your plan to quit on that date.  Do not try to slowly  cut down on the number of cigarettes you are smoking. Pick a quit date and quit smoking completely starting on that day.  Throw away all cigarettes.   Clean and  remove all ashtrays from your home, work, and car.  On a card, write down your reasons for quitting. Carry the card with you and read it when you get the urge to smoke.  Cleanse your body of nicotine. Drink enough water and fluids to keep your urine clear or pale yellow. Do this after quitting to flush the nicotine from your body.  Learn to predict your moods. Do not let a bad situation be your excuse to have a cigarette. Some situations in your life might tempt you into wanting a cigarette.  Never have "just one" cigarette. It leads to wanting another and another. Remind yourself of your decision to quit.  Change habits associated with smoking. If you smoked while driving or when feeling stressed, try other activities to replace smoking. Stand up when drinking your coffee. Brush your teeth after eating. Sit in a different chair when you read the paper. Avoid alcohol while trying to quit, and try to drink fewer caffeinated beverages. Alcohol and caffeine may urge you to smoke.  Avoid foods and drinks that can trigger a desire to smoke, such as sugary or spicy foods and alcohol.  Ask people who smoke not to smoke around you.  Have something planned to do right after eating or having a cup of coffee. For example, plan to take a walk or exercise.  Try a relaxation exercise to calm you down and decrease your stress. Remember, you may be tense and nervous for the first 2 weeks after you quit, but this will pass.  Find new activities to keep your hands busy. Play with a pen, coin, or rubber band. Doodle or draw things on paper.  Brush your teeth right after eating. This will help cut down on the craving for the taste of tobacco after meals. You can also try mouthwash.   Use oral substitutes in place of cigarettes. Try using lemon drops, carrots, cinnamon sticks, or chewing gum. Keep them handy so they are available when you have the urge to smoke.  When you have the urge to smoke, try deep  breathing.  Designate your home as a nonsmoking area.  If you are a heavy smoker, ask your health care provider about a prescription for nicotine chewing gum. It can ease your withdrawal from nicotine.  Reward yourself. Set aside the cigarette money you save and buy yourself something nice.  Look for support from others. Join a support group or smoking cessation program. Ask someone at home or at work to help you with your plan to quit smoking.  Always ask yourself, "Do I need this cigarette or is this just a reflex?" Tell yourself, "Today, I choose not to smoke," or "I do not want to smoke." You are reminding yourself of your decision to quit.  Do not replace cigarette smoking with electronic cigarettes (commonly called e-cigarettes). The safety of e-cigarettes is unknown, and some may contain harmful chemicals.  If you relapse, do not give up! Plan ahead and think about what you will do the next time you get the urge to smoke. HOW WILL I FEEL WHEN I QUIT SMOKING? You may have symptoms of withdrawal because your body is used to nicotine (the addictive substance in cigarettes). You may crave cigarettes, be irritable, feel very hungry, cough often, get headaches, or have  difficulty concentrating. The withdrawal symptoms are only temporary. They are strongest when you first quit but will go away within 10-14 days. When withdrawal symptoms occur, stay in control. Think about your reasons for quitting. Remind yourself that these are signs that your body is healing and getting used to being without cigarettes. Remember that withdrawal symptoms are easier to treat than the major diseases that smoking can cause.  Even after the withdrawal is over, expect periodic urges to smoke. However, these cravings are generally short lived and will go away whether you smoke or not. Do not smoke! WHAT RESOURCES ARE AVAILABLE TO HELP ME QUIT SMOKING? Your health care provider can direct you to community resources or  hospitals for support, which may include:  Group support.  Education.  Hypnosis.  Therapy.   This information is not intended to replace advice given to you by your health care provider. Make sure you discuss any questions you have with your health care provider.   Document Released: 03/14/2004 Document Revised: 07/07/2014 Document Reviewed: 12/02/2012 Elsevier Interactive Patient Education 2016 [employer].  Emergency Department Resource Guide 1) Find a Doctor and Pay Out of Pocket Although you won't have to find out who is covered by your insurance plan, it is a good idea to ask around and get recommendations. You will then need to call the office and see if the doctor you have chosen will accept you as a new patient and what types of options they offer for patients who are self-pay. Some doctors offer discounts or will set up payment plans for their patients who do not have insurance, but you will need to ask so you aren't surprised when you get to your appointment.  2) Contact Your Local Health Department Not all health departments have doctors that can see patients for sick visits, but many do, so it is worth a call to see if yours does. If you don't know where your local health department is, you can check in your phone book. The CDC also has a tool to help you locate your state's health department, and many state websites also have listings of all of their local health departments.  3) Find a Walk-in Clinic If your illness is not likely to be very severe or complicated, you may want to try a walk in clinic. These are popping up all over the country in pharmacies, drugstores, and shopping centers. They're usually staffed by nurse practitioners or physician assistants that have been trained to treat common illnesses and complaints. They're usually fairly quick and inexpensive. However, if you have serious medical issues or chronic medical problems, these are probably not your best  option.  No Primary Care Doctor: - Call [facility] at  [phone] - they can help you locate a primary care doctor that  accepts your insurance, provides certain services, etc. - Physician Referral Service- [phone]  Chronic Pain Problems: Organization         Address  Phone   Notes  [facility]  [phone] Patients need to be referred by their primary care doctor.   Medication Assistance: Organization         Address  Phone   Notes  [facility] Med[facility] [address] [place] [place], [place] [address] [phone] --Must be a resident of [facility] -- Must have NO insurance coverage whatsoever (no Medicaid/ Medicare, etc.) -- The pt. MUST have a primary care doctor that directs their care regularly and  follows them in the community   [facility]  [phone]   [employer]  [phone]    Behavioral Health Resources in the Community: Intensive Outpatient Programs Organization         Address  Phone  Notes  [facility] [address], [place], [place] [phone]   [facility] [address], [place], [place] [phone]   ADS: [facility] Drug S[facility] [address], [place], [place]  [phone]   [facility] [address],  [place], [place] [phone] or [phone]   Substance Abuse Resources Organization         Address  Phone  Notes  Al[facility] Drug Services  [phone]   [facility]  [phone]   [facility]  [phone]   [facility]  [phone]   Residential & Outpatient Substance Abuse Program  [phone]   Psychological Services Organization         Address  Phone  Notes  [facility]  [phone]- [phone]   [facility]  [phone]- [phone]   [facility] [address], [place] [phone] or [phone]    Mobile Crisis Teams Organization          Address  Phone  Notes  [facility], Mobile Crisis Care Unit  [phone]   [facility]  [address] [place], [place] [phone]   Doristine LocksSharon DeEsch [address] [place] [phone]    Self-Help/Support Groups Organization         Address  Phone             Notes  [facility] [place] - variety of support groups  [phone]- [phone] Call for more information  Narcotics Anonymous (NA), [facility] [address], [place]  2 meetings at this location   Statisticianesidential Treatment Programs Organization         Address  Phone  Notes  [facility] [address],    [place]  [phone]   [facility]  [address], [place] [phone], [place], [place] [phone]   [facility] [address], [place] [phone] Admissions: 8am-3pm M-F  [facility] [address]    [place], [place] [phone]   [facility] [address], [place], [place] [phone]   [facility] [address]  [place], [place] [phone]   [facility] - Intensive Outpatient [address], [place], [place] [phone]   [facility] ([facility] [address]  [place], [place] [phone] or [phone]   [facility] (RTS) [address] [place], [place] [phone] Accepts Medicaid  [facility] [address]  [place] [phone] Substance Abuse/Addiction Treatment   [facility] Resources Organization         Address  Phone  Notes  [facility]  [phone]   Angie FavaJulie Brannon, PhD [address], [address] [place], [place]   [phone] or [phone]   [facility]   [address] [place], [place] [phone]   [facility] [address], [place], [place] [phone] Insurance/Medicaid/sponsorship  through [facility]  [facility] [address]  [place], [place] [phone] Therapy/tele-psych/case  [facility] [address]   [place], [place] [phone]    Dr. Lolly Mustache  [phone]   [facility]  [facility] Dept. 1) [address], [place] 2) [address] [place] 3)  [address], [place] [phone]  [phone]   [facility] Child Abuse Hotline [phone] or 3084872683 (After Hours)      Behavioral Health Resources in the [facility]  Intensive Outpatient Programs: [facility]      [address] [place], [place] [phone] Both a day and evening program       [facility] Out[facility]     [address]        [place], [place] [address] [phone]         ADS: Alcohol & Drug Svcs [address] [place] [phone]  [facility] Mental Health ACCESS LINE: [phone] or [phone] [address] [place], [place] [address] [email or web address]  Mobile Crisis Teams:                                        [facility]         Mobile Crisis Care Unit [phone]             [facility] [address] Ginette Otto [phone]                                         Interventionist [address] DeEsch [address], [address] [place] [phone]  Self-Help/Support Groups: Mental Health Assoc. of [employer] of support groups [phone] (call for more info)  Narcotics Anonymous (NA) [facility] [address] [place] - 2 meetings at this location  Residential Treatment Programs:  [facility]      [address]        [place]       [phone]         [facility] [address] [place] [address] [place], [place]   [address] [phone]  [facility]  [address] [place] [address]vetta Vohs [place], [place] [address] [phone] Admissions: 8am-3pm M-F  [facility]     [address]        [place], [place] [address]       [phone]         [facility] [address] [place], [place] [phone]  [facility] [address] [place], [place] [phone]  [facility] - Intensive Outpatient      [address]     [place], [place]       [phone]         [facility] (Addiction Recovery Care [facility]oc.)     [address] [place], [place] [phone] or [phone]  [facility]idential Treatment Services (RTS)  [address] [place], [place] [phone]  [facility] Margo Aye                                               [phone]  Otho Perl [place] [phone]  [facility] Resources: [facility] [place]- [phone]               General Therapy                                                Angie Fava, PhD        [address] [place], [place] [address]         [phone]   Insurance  [facility]   [address] [place], [place] [address] [phone]  [facility] [address] [place], [place] [address] [phone] Insurance/Medicaid/sponsorship through [facility] and Families                                              [address]                                        [place], [place] [address]    Therapy/tele-psych/case         [phone]          [facility] [address], [place]  [address]  Adolescent/group home/case management [phone]                                           Creola Corn PhD       General therapy       Insurance   [phone]         Dr. Lolly Mustache Insurance [phone] M-F  [place] [facility]ox/Residential Medicaid, sponsorship [phone]

## 2015-12-14 NOTE — ED Notes (Signed)
Spoke with social work. Social work to call shelters for openings.
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
## 2015-12-14 NOTE — ED Notes (Signed)
Pt resting on stretcher, NAD

## 2015-12-14 NOTE — ED Notes (Signed)
Pt resting on stretcher with eyes closed, RR even and unlabored, NAD 
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
## 2015-12-14 NOTE — ED Notes (Signed)
PA at bedside.
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
## 2015-12-14 NOTE — Progress Notes (Signed)
CM offered pt another [place] list to assist her with choice of medicaid provider for f/u care  Pt encouraged to call her choice and to see if the dr was accepting new medicaid pts, call DSS to add accepting medicaid MD name prior to making an appt with medicaid accepting MD Pt confirms she does not have a medicaid doctor at this time
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
## 2015-12-14 NOTE — ED Notes (Signed)
PT DISCHARGED. INSTRUCTIONS AND PRESCRIPTIONS GIVEN. AAOX4. PT IN NO APPARENT DISTRESS. THE OPPORTUNITY TO ASK QUESTIONS WAS PROVIDED. 

## 2016-01-04 ENCOUNTER — Emergency Department (HOSPITAL_COMMUNITY)
Admission: EM | Admit: 2016-01-04 | Discharge: 2016-01-04 | Disposition: A | Discharge status: Home / Self Care | Payer: Medicaid Other | Source: Home / Self Care

## 2016-01-04 ENCOUNTER — Encounter (HOSPITAL_COMMUNITY): Payer: Self-pay | Admitting: Emergency Medicine

## 2016-01-04 ENCOUNTER — Emergency Department (HOSPITAL_COMMUNITY)
Admission: EM | Admit: 2016-01-04 | Discharge: 2016-01-05 | Disposition: A | Discharge status: Home / Self Care | Payer: Medicaid Other | Attending: Emergency Medicine | Admitting: Emergency Medicine

## 2016-01-04 DIAGNOSIS — R062 Wheezing: Secondary | ICD-10-CM

## 2016-01-04 DIAGNOSIS — R0602 Shortness of breath: Secondary | ICD-10-CM

## 2016-01-04 DIAGNOSIS — J449 Chronic obstructive pulmonary disease, unspecified: Secondary | ICD-10-CM | POA: Insufficient documentation

## 2016-01-04 DIAGNOSIS — F17200 Nicotine dependence, unspecified, uncomplicated: Secondary | ICD-10-CM

## 2016-01-04 DIAGNOSIS — F1414 Cocaine abuse with cocaine-induced mood disorder: Secondary | ICD-10-CM

## 2016-01-04 DIAGNOSIS — Z79899 Other long term (current) drug therapy: Secondary | ICD-10-CM | POA: Diagnosis not present

## 2016-01-04 DIAGNOSIS — Z5321 Procedure and treatment not carried out due to patient leaving prior to being seen by health care provider: Secondary | ICD-10-CM | POA: Insufficient documentation

## 2016-01-04 DIAGNOSIS — F329 Major depressive disorder, single episode, unspecified: Secondary | ICD-10-CM | POA: Insufficient documentation

## 2016-01-04 DIAGNOSIS — F10230 Alcohol dependence with withdrawal, uncomplicated: Secondary | ICD-10-CM

## 2016-01-04 DIAGNOSIS — N189 Chronic kidney disease, unspecified: Secondary | ICD-10-CM | POA: Insufficient documentation

## 2016-01-04 DIAGNOSIS — R45851 Suicidal ideations: Secondary | ICD-10-CM | POA: Diagnosis present

## 2016-01-04 DIAGNOSIS — J45909 Unspecified asthma, uncomplicated: Secondary | ICD-10-CM | POA: Insufficient documentation

## 2016-01-04 LAB — COMPREHENSIVE METABOLIC PANEL
ALBUMIN: 3.6 g/dL (ref 3.5–5.0)
ALT: 29 U/L (ref 14–54)
AST: 37 U/L (ref 15–41)
Alkaline Phosphatase: 88 U/L (ref 38–126)
Anion gap: 9 (ref 5–15)
BUN: 7 mg/dL (ref 6–20)
CHLORIDE: 109 mmol/L (ref 101–111)
CO2: 22 mmol/L (ref 22–32)
Calcium: 8.6 mg/dL — ABNORMAL LOW (ref 8.9–10.3)
Creatinine, Ser: 0.92 mg/dL (ref 0.44–1.00)
GFR calc Af Amer: >60 mL/min (ref >60)
GFR calc non Af Amer: >60 mL/min (ref >60)
GLUCOSE: 99 mg/dL (ref 65–99)
POTASSIUM: 3.2 mmol/L — AB (ref 3.5–5.1)
SODIUM: 140 mmol/L (ref 135–145)
Total Bilirubin: 0.4 mg/dL (ref 0.3–1.2)
Total Protein: 6.9 g/dL (ref 6.5–8.1)

## 2016-01-04 LAB — CBC
HEMATOCRIT: 34.7 % — AB (ref 36.0–46.0)
HEMOGLOBIN: 11.8 g/dL — AB (ref 12.0–15.0)
MCH: 30.5 pg (ref 26.0–34.0)
MCHC: 34 g/dL (ref 30.0–36.0)
MCV: 89.7 fL (ref 78.0–100.0)
Platelets: 302 10*3/uL (ref 150–400)
RBC: 3.87 MIL/uL (ref 3.87–5.11)
RDW: 16.3 % — AB (ref 11.5–15.5)
WBC: 10.4 10*3/uL (ref 4.0–10.5)

## 2016-01-04 LAB — SALICYLATE LEVEL: Salicylate Lvl: <4 mg/dL (ref 2.8–30.0)

## 2016-01-04 LAB — MAGNESIUM: Magnesium: 1.8 mg/dL (ref 1.7–2.4)

## 2016-01-04 LAB — ACETAMINOPHEN LEVEL
ACETAMINOPHEN (TYLENOL), SERUM: 52 ug/mL — AB (ref 10–30)
ACETAMINOPHEN (TYLENOL), SERUM: 29 ug/mL (ref 10–30)

## 2016-01-04 LAB — ETHANOL: Alcohol, Ethyl (B): 145 mg/dL — ABNORMAL HIGH (ref <5)

## 2016-01-04 MED ORDER — NICOTINE 21 MG/24HR TD PT24
21.0000 mg | MEDICATED_PATCH | Freq: Every day | TRANSDERMAL | Status: DC
  Filled 2016-01-04: qty 1

## 2016-01-04 MED ORDER — SODIUM CHLORIDE 0.9 % IV BOLUS (SEPSIS)
1000.0000 mL | Freq: Once | INTRAVENOUS | Status: AC
  Administered 2016-01-04: 1000 mL via INTRAVENOUS

## 2016-01-04 MED ORDER — LORAZEPAM 1 MG PO TABS
0.0000 mg | ORAL_TABLET | Freq: Four times a day (QID) | ORAL | Status: DC
  Administered 2016-01-05: 1 mg via ORAL
  Filled 2016-01-04: qty 2

## 2016-01-04 MED ORDER — LORAZEPAM 1 MG PO TABS
0.0000 mg | ORAL_TABLET | Freq: Two times a day (BID) | ORAL | Status: DC
Start: 2016-01-07 — End: 2016-01-05

## 2016-01-04 MED ORDER — ONDANSETRON HCL 4 MG PO TABS: 4.0000 mg | ORAL_TABLET | Freq: Three times a day (TID) | ORAL | Status: DC | PRN

## 2016-01-04 MED ORDER — POTASSIUM CHLORIDE CRYS ER 20 MEQ PO TBCR
40.0000 meq | EXTENDED_RELEASE_TABLET | Freq: Once | ORAL | Status: AC
  Administered 2016-01-04: 40 meq via ORAL
  Filled 2016-01-04: qty 2

## 2016-01-04 NOTE — ED Notes (Signed)
This nurse attempted to apply EKG leads to pt; pt pulled leads off and refused to cooperate with treatment

## 2016-01-04 NOTE — ED Notes (Addendum)
Pt came in by ems and was upset about being put in the waiting room. PT then got up out of the wheelchair and walked out.

## 2016-01-04 NOTE — ED Notes (Signed)
PT RECEIVED VIA EMS C/O SOB AND WHEEZING. PER EMS, PT RAN OUT OF HER INHALERS. PER EMS LUNGS CLEAR THROUGH OUT.
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
## 2016-01-04 NOTE — ED Notes (Signed)
Patient belongings (pocket book with alcohol and phone in it) placed at nursing station on the resuscitation side. Jeanice LimHolly, RN made aware.

## 2016-01-04 NOTE — ED Notes (Signed)
MD Freida BusmanAllen notified of poison control's recommendations; no orders at this time

## 2016-01-04 NOTE — ED Notes (Signed)
Attempt to call pt multiple times from lobby to triage without response.

## 2016-01-04 NOTE — ED Notes (Signed)
Patient name called for a second time in lobby with no response.

## 2016-01-04 NOTE — ED Notes (Signed)
Poison control called and notified that pt's EKG is unchanged since June 15th; poison control states that with this information, the sodium bicarb is unnecessary

## 2016-01-04 NOTE — ED Notes (Addendum)
Poison Control recommends  If QRS is greater than 140 milliseconds, give 1-2 milliequivalents/kg (82 or more) of sodium bicarb IV Push and repeat EK

## 2016-01-04 NOTE — ED Notes (Signed)
Per Freida BusmanAllen transfer pt to room for monitoring; IVC in process.

## 2016-01-04 NOTE — ED Notes (Signed)
Pt swearing at staff and being verbally aggressive

## 2016-01-04 NOTE — ED Notes (Signed)
Pt returned to the waiting room and knocked on the nurse first desk and left. Pt has been called numerous times.

## 2016-01-04 NOTE — ED Notes (Signed)
Pt called from lobby without response.  

## 2016-01-04 NOTE — ED Notes (Signed)
Poison control recommends magnesium supplementation

## 2016-01-04 NOTE — ED Notes (Signed)
Per Lawson FiscalLori poison control, the amount of tylenol that pt took IS NOT toxic but benadryl may cause anticholinergic effects. Order for: IV fluids, seizure precautions, tylenol level at 2200, aspirin level now, Mg level now, CMP now, and EKG.

## 2016-01-04 NOTE — ED Notes (Signed)
Patient y

## 2016-01-04 NOTE — ED Notes (Addendum)
Pt LWBS before triage earlier today. Pt check in again for medication refill; out of inhaler for asthma; lungs sounds clear. With triage pt reports SI and states drank a lot of ETOH and 12 acetaminophen PM (500 mg acetaminophen and 25 mg benedryl in each tablet) 30 minutes ago.

## 2016-01-04 NOTE — ED Notes (Addendum)
Attempt IV twice unsuccesful; Gaynelle AduNeilson charge RN aware.

## 2016-01-04 NOTE — ED Notes (Signed)
Patient name called 1x in lobby with no response.

## 2016-01-05 DIAGNOSIS — F1414 Cocaine abuse with cocaine-induced mood disorder: Secondary | ICD-10-CM | POA: Diagnosis not present

## 2016-01-05 LAB — RAPID URINE DRUG SCREEN, HOSP PERFORMED
AMPHETAMINES: NOT DETECTED
Barbiturates: NOT DETECTED
Benzodiazepines: POSITIVE — AB
Cocaine: POSITIVE — AB
OPIATES: NOT DETECTED
TETRAHYDROCANNABINOL: NOT DETECTED

## 2016-01-05 MED ORDER — HYDROXYZINE HCL 25 MG PO TABS: 25.0000 mg | ORAL_TABLET | Freq: Four times a day (QID) | ORAL | Status: DC | PRN

## 2016-01-05 MED ORDER — HYDROXYZINE HCL 50 MG PO TABS: 50.0000 mg | ORAL_TABLET | Freq: Three times a day (TID) | ORAL | Status: DC | PRN

## 2016-01-05 MED ORDER — GABAPENTIN 400 MG PO CAPS: 400.0000 mg | ORAL_CAPSULE | Freq: Three times a day (TID) | ORAL | Status: DC

## 2016-01-05 MED ORDER — GABAPENTIN 300 MG PO CAPS
300.0000 mg | ORAL_CAPSULE | Freq: Three times a day (TID) | ORAL | Status: DC
  Administered 2016-01-05: 300 mg via ORAL
  Filled 2016-01-05: qty 1

## 2016-01-05 MED ORDER — ALBUTEROL SULFATE HFA 108 (90 BASE) MCG/ACT IN AERS
2.0000 | INHALATION_SPRAY | RESPIRATORY_TRACT | Status: DC | PRN
  Administered 2016-01-05: 2 via RESPIRATORY_TRACT
  Filled 2016-01-05: qty 6.7

## 2016-01-05 MED ORDER — LORAZEPAM 1 MG PO TABS
1.0000 mg | ORAL_TABLET | Freq: Once | ORAL | Status: AC
  Administered 2016-01-05: 1 mg via ORAL
  Filled 2016-01-05: qty 1

## 2016-01-05 NOTE — ED Notes (Signed)
EKG given to Dr. Lockwood. 

## 2016-01-05 NOTE — ED Notes (Signed)
Poison control recommends repeat EKG in the morning
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
## 2016-01-05 NOTE — ED Notes (Addendum)
Poison Control called and wanted to know the QTC measurement.  Waiting for DR. Jeraldine LootsLockwood to supply information.
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
## 2016-01-05 NOTE — ED Notes (Signed)
[facility] [place] [facility] to [facility] QTC measurement.  Per [place], case is closed.

## 2016-01-05 NOTE — ED Notes (Signed)
Pt ambulated to restroom. 

## 2016-01-05 NOTE — ED Notes (Signed)
Social Worker at bedside.

## 2016-01-05 NOTE — Discharge Instructions (Signed)
Chemical Dependency °Chemical dependency is an addiction to drugs or alcohol. It is characterized by the repeated behavior of seeking out and using drugs and alcohol despite harmful consequences to the health and safety of ones self and others.  °RISK FACTORS °There are certain situations or behaviors that increase a person's risk for chemical dependency. These include: °· A family history of chemical dependency. °· A history of mental health issues, including depression and anxiety. °· A home environment where drugs and alcohol are easily available to you. °· Drug or alcohol use at a young age. °SYMPTOMS  °The following symptoms can indicate chemical dependency: °· Inability to limit the use of drugs or alcohol. °· Nausea, sweating, shakiness, and anxiety that occurs when alcohol or drugs are not being used. °· An increase in amount of drugs or alcohol that is necessary to get drunk or high. °People who experience these symptoms can assess their use of drugs and alcohol by asking themselves the following questions: °· Have you been told by friends or family that they are worried about your use of alcohol or drugs? °· Do friends and family ever tell you about things you did while drinking alcohol or using drugs that you do not remember? °· Do you lie about using alcohol or drugs or about the amounts you use? °· Do you have difficulty completing daily tasks unless you use alcohol or drugs? °· Is the level of your work or school performance lower because of your drug or alcohol use? °· Do you get sick from using drugs or alcohol but keep using anyway? °· Do you feel uncomfortable in social situations unless you use alcohol or drugs? °· Do you use drugs or alcohol to help forget problems?  °An answer of yes to any of these questions may indicate chemical dependency. Professional evaluation is suggested. °  °This information is not intended to replace advice given to you by your health care provider. Make sure you  discuss any questions you have with your health care provider. °  °Document Released: 06/10/2001 Document Revised: 09/08/2011 Document Reviewed: 08/22/2010 °Elsevier Interactive Patient Education ©2016 [employer] ° °

## 2016-01-13 ENCOUNTER — Emergency Department (HOSPITAL_COMMUNITY)
Admission: EM | Admit: 2016-01-13 | Discharge: 2016-01-13 | Disposition: A | Discharge status: Home / Self Care | Payer: Medicaid Other | Attending: Emergency Medicine | Admitting: Emergency Medicine

## 2016-01-13 ENCOUNTER — Encounter (HOSPITAL_COMMUNITY): Payer: Self-pay | Admitting: *Deleted

## 2016-01-13 DIAGNOSIS — F1290 Cannabis use, unspecified, uncomplicated: Secondary | ICD-10-CM | POA: Diagnosis not present

## 2016-01-13 DIAGNOSIS — Z79899 Other long term (current) drug therapy: Secondary | ICD-10-CM | POA: Diagnosis not present

## 2016-01-13 DIAGNOSIS — F17200 Nicotine dependence, unspecified, uncomplicated: Secondary | ICD-10-CM | POA: Diagnosis not present

## 2016-01-13 DIAGNOSIS — F329 Major depressive disorder, single episode, unspecified: Secondary | ICD-10-CM | POA: Diagnosis not present

## 2016-01-13 DIAGNOSIS — F1490 Cocaine use, unspecified, uncomplicated: Secondary | ICD-10-CM | POA: Diagnosis not present

## 2016-01-13 DIAGNOSIS — J9801 Acute bronchospasm: Secondary | ICD-10-CM

## 2016-01-13 DIAGNOSIS — N189 Chronic kidney disease, unspecified: Secondary | ICD-10-CM | POA: Diagnosis not present

## 2016-01-13 DIAGNOSIS — R0602 Shortness of breath: Secondary | ICD-10-CM | POA: Diagnosis present

## 2016-01-13 DIAGNOSIS — J449 Chronic obstructive pulmonary disease, unspecified: Secondary | ICD-10-CM | POA: Diagnosis not present

## 2016-01-13 MED ORDER — ALBUTEROL SULFATE HFA 108 (90 BASE) MCG/ACT IN AERS
2.0000 | INHALATION_SPRAY | RESPIRATORY_TRACT | Status: DC
  Administered 2016-01-13: 2 via RESPIRATORY_TRACT
  Filled 2016-01-13: qty 6.7

## 2016-01-13 MED ORDER — CHLORDIAZEPOXIDE HCL 25 MG PO CAPS: ORAL_CAPSULE | ORAL | Status: DC

## 2016-01-13 MED ORDER — PREDNISONE 20 MG PO TABS
60.0000 mg | ORAL_TABLET | Freq: Once | ORAL | Status: AC
  Administered 2016-01-13: 60 mg via ORAL
  Filled 2016-01-13: qty 3

## 2016-01-13 MED ORDER — CHLORDIAZEPOXIDE HCL 25 MG PO CAPS
25.0000 mg | ORAL_CAPSULE | Freq: Once | ORAL | Status: AC
  Administered 2016-01-13: 25 mg via ORAL

## 2016-01-13 MED ORDER — ALBUTEROL (5 MG/ML) CONTINUOUS INHALATION SOLN
10.0000 mg/h | INHALATION_SOLUTION | Freq: Once | RESPIRATORY_TRACT | Status: AC
  Administered 2016-01-13: 10 mg/h via RESPIRATORY_TRACT
  Filled 2016-01-13: qty 20

## 2016-01-13 MED ORDER — PREDNISONE 50 MG PO TABS: ORAL_TABLET | ORAL | Status: DC

## 2016-01-13 NOTE — ED Notes (Addendum)
Per EMS - patient is homeless and was picked up on street with c/o anxiety and shortness of breath related to that.  [place] [facility].  She is out of inhalers and states she has been out of those and her psych meds for "months."  Patient states she is an alcoholic and will go through withdrawal soon.  Patient in no respiratory distress, speaking in complete sentences.  5 mg of Albuterol and Duoneb nebulizers given to patient en route.  Mild expiratory wheezing heard throughout.  Patient denying pain.  Vitals 130/90, HR 80-90, RR 20, 100% after neb tx.

## 2016-01-13 NOTE — Discharge Instructions (Signed)
Bronchospasm, Adult  A bronchospasm is a spasm or tightening of the airways going into the lungs. During a bronchospasm breathing becomes more difficult because the airways get smaller. When this happens there can be coughing, a whistling sound when breathing (wheezing), and difficulty breathing. Bronchospasm is often associated with asthma, but not all patients who experience a bronchospasm have asthma.  CAUSES   A bronchospasm is caused by inflammation or irritation of the airways. The inflammation or irritation may be triggered by:   · Allergies (such as to animals, pollen, food, or mold). Allergens that cause bronchospasm may cause wheezing immediately after exposure or many hours later.    · Infection. Viral infections are believed to be the most common cause of bronchospasm.    · Exercise.    · Irritants (such as pollution, cigarette smoke, strong odors, aerosol sprays, and paint fumes).    · Weather changes. Winds increase molds and pollens in the air. Rain refreshes the air by washing irritants out. Cold air may cause inflammation.    · Stress and emotional upset.    SIGNS AND SYMPTOMS   · Wheezing.    · Excessive nighttime coughing.    · Frequent or severe coughing with a simple cold.    · Chest tightness.    · Shortness of breath.    DIAGNOSIS   Bronchospasm is usually diagnosed through a history and physical exam. Tests, such as chest X-rays, are sometimes done to look for other conditions.  TREATMENT   · Inhaled medicines can be given to open up your airways and help you breathe. The medicines can be given using either an inhaler or a nebulizer machine.  · Corticosteroid medicines may be given for severe bronchospasm, usually when it is associated with asthma.  HOME CARE INSTRUCTIONS   · Always have a plan prepared for seeking medical care. Know when to call your health care provider and local emergency services (911 in the [place] Know where you can access local emergency care.  · Only take medicines as  directed by your health care provider.  · If you were prescribed an inhaler or nebulizer machine, ask your health care provider to explain how to use it correctly. Always use a spacer with your inhaler if you were given one.  · It is necessary to remain calm during an attack. Try to relax and breathe more slowly.   · Control your home environment in the following ways:      Change your heating and air conditioning filter at least once a month.      Limit your use of fireplaces and wood stoves.    Do not smoke and do not allow smoking in your home.      Avoid exposure to perfumes and fragrances.      Get rid of pests (such as roaches and mice) and their droppings.      Throw away plants if you see mold on them.      Keep your house clean and dust free.      Replace carpet with wood, tile, or vinyl flooring. Carpet can trap dander and dust.      Use allergy-proof pillows, mattress covers, and box spring covers.      Wash bed sheets and blankets every week in hot water and dry them in a dryer.      Use blankets that are made of polyester or cotton.      Wash hands frequently.  SEEK MEDICAL CARE IF:   · You have muscle aches.    · You have chest pain.    · The sputum changes from clear or   white to yellow, green, gray, or bloody.    · The sputum you cough up gets thicker.    · There are problems that may be related to the medicine you are given, such as a rash, itching, swelling, or trouble breathing.    SEEK IMMEDIATE MEDICAL CARE IF:   · You have worsening wheezing and coughing even after taking your prescribed medicines.    · You have increased difficulty breathing.    · You develop severe chest pain.  MAKE SURE YOU:   · Understand these instructions.  · Will watch your condition.  · Will get help right away if you are not doing well or get worse.     This information is not intended to replace advice given to you by your health care provider. Make sure you discuss any questions you have with your health care  provider.     Document Released: 06/19/2003 Document Revised: 07/07/2014 Document Reviewed: 12/06/2012  Elsevier Interactive Patient Education ©2016 [employer]

## 2016-01-13 NOTE — ED Notes (Signed)
Patient removed neb treatment, stating she is too anxious to tolerate it.  Patient asking for assistance in obtaining prescription medications (Zyprexa, etc).

## 2016-01-13 NOTE — ED Notes (Signed)
Patient's breath sounds clear with exception of end expiratory wheezing LLL.  Patient has rattling productive cough in keeping with COPD.  Patient alert & oriented and [facility] [place] [facility] parole on Monday.  Denies N/V/D, fever and pain.  Last EtoH intake was this morning @3 -4 am.
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
## 2016-01-13 NOTE — Care Management (Signed)
CM spoke with patient at the bedside. Provided a coupon for [facility] 50 mg 4 tablets[facility] and a coupon for Librium 25 mg tablets, [facility].00 at [facility] Patient states she has Medicaid which has not "clicked in" because she was released from prison on June 8th. Rubie Maidrystal Tony Granquist RN CCM

## 2016-03-18 ENCOUNTER — Emergency Department (HOSPITAL_COMMUNITY)
Admission: EM | Admit: 2016-03-18 | Discharge: 2016-03-18 | Disposition: A | Discharge status: Home / Self Care | Payer: Medicaid Other | Attending: Emergency Medicine | Admitting: Emergency Medicine

## 2016-03-18 ENCOUNTER — Encounter (HOSPITAL_COMMUNITY): Payer: Self-pay | Admitting: Emergency Medicine

## 2016-03-18 DIAGNOSIS — N189 Chronic kidney disease, unspecified: Secondary | ICD-10-CM | POA: Diagnosis not present

## 2016-03-18 DIAGNOSIS — Z7952 Long term (current) use of systemic steroids: Secondary | ICD-10-CM | POA: Diagnosis not present

## 2016-03-18 DIAGNOSIS — F1290 Cannabis use, unspecified, uncomplicated: Secondary | ICD-10-CM | POA: Diagnosis not present

## 2016-03-18 DIAGNOSIS — F1010 Alcohol abuse, uncomplicated: Secondary | ICD-10-CM | POA: Diagnosis not present

## 2016-03-18 DIAGNOSIS — F17200 Nicotine dependence, unspecified, uncomplicated: Secondary | ICD-10-CM | POA: Diagnosis not present

## 2016-03-18 DIAGNOSIS — F1490 Cocaine use, unspecified, uncomplicated: Secondary | ICD-10-CM | POA: Diagnosis not present

## 2016-03-18 DIAGNOSIS — Z79899 Other long term (current) drug therapy: Secondary | ICD-10-CM | POA: Diagnosis not present

## 2016-03-18 DIAGNOSIS — J45909 Unspecified asthma, uncomplicated: Secondary | ICD-10-CM | POA: Insufficient documentation

## 2016-03-18 DIAGNOSIS — Z720 Tobacco use: Secondary | ICD-10-CM

## 2016-03-18 DIAGNOSIS — J441 Chronic obstructive pulmonary disease with (acute) exacerbation: Secondary | ICD-10-CM

## 2016-03-18 DIAGNOSIS — R0602 Shortness of breath: Secondary | ICD-10-CM | POA: Diagnosis present

## 2016-03-18 DIAGNOSIS — E860 Dehydration: Secondary | ICD-10-CM | POA: Insufficient documentation

## 2016-03-18 DIAGNOSIS — F1190 Opioid use, unspecified, uncomplicated: Secondary | ICD-10-CM | POA: Diagnosis not present

## 2016-03-18 DIAGNOSIS — R112 Nausea with vomiting, unspecified: Secondary | ICD-10-CM

## 2016-03-18 DIAGNOSIS — R197 Diarrhea, unspecified: Secondary | ICD-10-CM

## 2016-03-18 LAB — COMPREHENSIVE METABOLIC PANEL
ALT: 48 U/L (ref 14–54)
AST: 58 U/L — ABNORMAL HIGH (ref 15–41)
Albumin: 3.9 g/dL (ref 3.5–5.0)
Alkaline Phosphatase: 96 U/L (ref 38–126)
Anion gap: 11 (ref 5–15)
BILIRUBIN TOTAL: 1.4 mg/dL — AB (ref 0.3–1.2)
BUN: 6 mg/dL (ref 6–20)
CHLORIDE: 105 mmol/L (ref 101–111)
CO2: 21 mmol/L — ABNORMAL LOW (ref 22–32)
Calcium: 8.5 mg/dL — ABNORMAL LOW (ref 8.9–10.3)
Creatinine, Ser: 0.81 mg/dL (ref 0.44–1.00)
Glucose, Bld: 112 mg/dL — ABNORMAL HIGH (ref 65–99)
POTASSIUM: 3.4 mmol/L — AB (ref 3.5–5.1)
Sodium: 137 mmol/L (ref 135–145)
TOTAL PROTEIN: 7.9 g/dL (ref 6.5–8.1)

## 2016-03-18 LAB — URINALYSIS, ROUTINE W REFLEX MICROSCOPIC
BILIRUBIN URINE: NEGATIVE
Glucose, UA: NEGATIVE mg/dL
Hgb urine dipstick: NEGATIVE
KETONES UR: NEGATIVE mg/dL
LEUKOCYTES UA: NEGATIVE
NITRITE: NEGATIVE
PH: 6.5 (ref 5.0–8.0)
Protein, ur: NEGATIVE mg/dL
Specific Gravity, Urine: 1.015 (ref 1.005–1.030)

## 2016-03-18 LAB — CBC WITH DIFFERENTIAL/PLATELET
BASOS ABS: 0.1 10*3/uL (ref 0.0–0.1)
BASOS PCT: 1 %
EOS ABS: 0.4 10*3/uL (ref 0.0–0.7)
Eosinophils Relative: 5 %
HEMATOCRIT: 43.7 % (ref 36.0–46.0)
HEMOGLOBIN: 14.8 g/dL (ref 12.0–15.0)
Lymphocytes Relative: 28 %
Lymphs Abs: 2.6 10*3/uL (ref 0.7–4.0)
MCH: 31.3 pg (ref 26.0–34.0)
MCHC: 33.9 g/dL (ref 30.0–36.0)
MCV: 92.4 fL (ref 78.0–100.0)
MONO ABS: 0.8 10*3/uL (ref 0.1–1.0)
Monocytes Relative: 8 %
NEUTROS ABS: 5.6 10*3/uL (ref 1.7–7.7)
NEUTROS PCT: 58 %
Platelets: 208 10*3/uL (ref 150–400)
RBC: 4.73 MIL/uL (ref 3.87–5.11)
RDW: 13.9 % (ref 11.5–15.5)
WBC: 9.6 10*3/uL (ref 4.0–10.5)

## 2016-03-18 LAB — LIPASE, BLOOD: LIPASE: 28 U/L (ref 11–51)

## 2016-03-18 LAB — TROPONIN I

## 2016-03-18 LAB — ETHANOL

## 2016-03-18 LAB — MAGNESIUM: MAGNESIUM: 1.7 mg/dL (ref 1.7–2.4)

## 2016-03-18 MED ORDER — ALBUTEROL SULFATE HFA 108 (90 BASE) MCG/ACT IN AERS
2.0000 | INHALATION_SPRAY | RESPIRATORY_TRACT | Status: DC | PRN
  Administered 2016-03-18: 2 via RESPIRATORY_TRACT
  Filled 2016-03-18: qty 6.7

## 2016-03-18 MED ORDER — AEROCHAMBER PLUS FLO-VU MEDIUM MISC
1.0000 | Freq: Once | Status: AC
  Administered 2016-03-18: 1
  Filled 2016-03-18: qty 1

## 2016-03-18 MED ORDER — HYDROXYZINE HCL 50 MG PO TABS: 50.0000 mg | ORAL_TABLET | Freq: Three times a day (TID) | ORAL | 0 refills | Status: DC | PRN

## 2016-03-18 MED ORDER — IPRATROPIUM-ALBUTEROL 18-103 MCG/ACT IN AERO: 2.0000 | INHALATION_SPRAY | RESPIRATORY_TRACT | 0 refills | Status: DC | PRN

## 2016-03-18 MED ORDER — CITALOPRAM HYDROBROMIDE 20 MG PO TABS: 20.0000 mg | ORAL_TABLET | Freq: Every day | ORAL | 0 refills | Status: DC

## 2016-03-18 MED ORDER — AMITRIPTYLINE HCL 10 MG PO TABS: 10.0000 mg | ORAL_TABLET | Freq: Every day | ORAL | 0 refills | Status: DC

## 2016-03-18 MED ORDER — GABAPENTIN 400 MG PO CAPS: 400.0000 mg | ORAL_CAPSULE | Freq: Three times a day (TID) | ORAL | 0 refills | Status: DC

## 2016-03-18 MED ORDER — SODIUM CHLORIDE 0.9 % IV BOLUS (SEPSIS)
1000.0000 mL | Freq: Once | INTRAVENOUS | Status: AC
  Administered 2016-03-18: 1000 mL via INTRAVENOUS

## 2016-03-18 MED ORDER — ALBUTEROL SULFATE (2.5 MG/3ML) 0.083% IN NEBU
5.0000 mg | INHALATION_SOLUTION | Freq: Once | RESPIRATORY_TRACT | Status: AC
  Administered 2016-03-18: 5 mg via RESPIRATORY_TRACT
  Filled 2016-03-18: qty 6

## 2016-03-18 MED ORDER — PROMETHAZINE HCL 25 MG PO TABS: 25.0000 mg | ORAL_TABLET | Freq: Four times a day (QID) | ORAL | 0 refills | Status: DC | PRN

## 2016-03-18 MED ORDER — ONDANSETRON HCL 4 MG/2ML IJ SOLN
4.0000 mg | Freq: Once | INTRAMUSCULAR | Status: AC
  Administered 2016-03-18: 4 mg via INTRAVENOUS
  Filled 2016-03-18: qty 2

## 2016-03-18 MED ORDER — QUETIAPINE FUMARATE 50 MG PO TABS: 150.0000 mg | ORAL_TABLET | Freq: Every day | ORAL | 0 refills | Status: DC

## 2016-03-18 NOTE — Progress Notes (Signed)
Buss pass was given to Nurse.  Elenore PaddyLaVonia Emiel Kielty, LCSWA [phone] ED CSW 03/18/2016 11:29 AM

## 2016-03-18 NOTE — ED Triage Notes (Addendum)
Pt c/o of n/v/d since yesterday. Pt states she "ran out" of inhaler x4 days. SOB, expiratory wheezes noted. Reports urinary urgency and states she is concerned for "alcohol withdrawal."

## 2016-03-18 NOTE — ED Triage Notes (Signed)
Called for triage x 2, not in lobby.
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
## 2016-05-05 ENCOUNTER — Telehealth: Payer: Self-pay | Admitting: *Deleted

## 2016-05-05 NOTE — Telephone Encounter (Signed)
Pt called stating her Medicaid has been reinstated and she would like her Rx from June.  [facility] informed [facility] that she would have to be seen again because pharmacies do not fill Rx 5 months old.  Pt states her pharmacy  [employer] [facility], [facility]) states they would.  EDCM asked pt to have [facility] call me.  [facility] states pt needs prior approval for Rx written.  EDCM advised that PA from ED is rarely done as we do not follow up with pt.  Pharmacy states they will send PA to ED.

## 2016-05-16 ENCOUNTER — Encounter (HOSPITAL_COMMUNITY): Payer: Self-pay | Admitting: Emergency Medicine

## 2016-05-16 ENCOUNTER — Emergency Department (HOSPITAL_COMMUNITY): Payer: Medicaid Other

## 2016-05-16 ENCOUNTER — Emergency Department (HOSPITAL_COMMUNITY)
Admission: EM | Admit: 2016-05-16 | Discharge: 2016-05-16 | Disposition: A | Discharge status: Home / Self Care | Payer: Medicaid Other | Attending: Physician Assistant | Admitting: Physician Assistant

## 2016-05-16 DIAGNOSIS — F419 Anxiety disorder, unspecified: Secondary | ICD-10-CM | POA: Diagnosis present

## 2016-05-16 DIAGNOSIS — F17200 Nicotine dependence, unspecified, uncomplicated: Secondary | ICD-10-CM | POA: Diagnosis not present

## 2016-05-16 DIAGNOSIS — J449 Chronic obstructive pulmonary disease, unspecified: Secondary | ICD-10-CM | POA: Diagnosis not present

## 2016-05-16 DIAGNOSIS — N189 Chronic kidney disease, unspecified: Secondary | ICD-10-CM | POA: Diagnosis not present

## 2016-05-16 DIAGNOSIS — Z79899 Other long term (current) drug therapy: Secondary | ICD-10-CM | POA: Insufficient documentation

## 2016-05-16 DIAGNOSIS — F410 Panic disorder [episodic paroxysmal anxiety] without agoraphobia: Secondary | ICD-10-CM

## 2016-05-16 MED ORDER — IPRATROPIUM-ALBUTEROL 18-103 MCG/ACT IN AERO: 1.0000 | INHALATION_SPRAY | RESPIRATORY_TRACT | 0 refills | Status: DC | PRN

## 2016-05-16 MED ORDER — HYDROXYZINE HCL 25 MG PO TABS: 100.0000 mg | ORAL_TABLET | Freq: Once | ORAL | Status: DC

## 2016-05-16 MED ORDER — FLUTICASONE PROPIONATE HFA 220 MCG/ACT IN AERO: 2.0000 | INHALATION_SPRAY | Freq: Two times a day (BID) | RESPIRATORY_TRACT | 12 refills | Status: DC

## 2016-05-16 MED ORDER — HYDROXYZINE HCL 50 MG PO TABS: 50.0000 mg | ORAL_TABLET | Freq: Four times a day (QID) | ORAL | 0 refills | Status: DC

## 2016-05-16 MED ORDER — ALBUTEROL SULFATE (2.5 MG/3ML) 0.083% IN NEBU
5.0000 mg | INHALATION_SOLUTION | Freq: Once | RESPIRATORY_TRACT | Status: AC
  Administered 2016-05-16: 5 mg via RESPIRATORY_TRACT
  Filled 2016-05-16: qty 6

## 2016-05-16 MED ORDER — HYDROXYZINE HCL 25 MG PO TABS
50.0000 mg | ORAL_TABLET | Freq: Once | ORAL | Status: AC
  Administered 2016-05-16: 50 mg via ORAL
  Filled 2016-05-16: qty 2

## 2016-05-16 MED ORDER — ALBUTEROL SULFATE HFA 108 (90 BASE) MCG/ACT IN AERS
1.0000 | INHALATION_SPRAY | Freq: Four times a day (QID) | RESPIRATORY_TRACT | 0 refills | Status: DC | PRN
Start: 2016-05-16 — End: 2016-12-10
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
## 2016-05-16 NOTE — Discharge Instructions (Signed)
Please follow with your primary care doctor in the next 2 days for a check-up. They must obtain records for further management.  ° °Do not hesitate to return to the Emergency Department for any new, worsening or concerning symptoms.  ° °
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
## 2016-05-16 NOTE — ED Notes (Signed)
Pt attempting to split staff. She continues to ask for food when she has been repeatedly told "no" by the provider and nursing staff. Pt given water.

## 2016-12-10 ENCOUNTER — Encounter (HOSPITAL_COMMUNITY): Payer: Self-pay | Admitting: *Deleted

## 2016-12-10 ENCOUNTER — Emergency Department (HOSPITAL_COMMUNITY)
Admission: EM | Admit: 2016-12-10 | Discharge: 2016-12-10 | Disposition: A | Discharge status: Home / Self Care | Payer: Medicaid Other | Attending: Emergency Medicine | Admitting: Emergency Medicine

## 2016-12-10 ENCOUNTER — Emergency Department (HOSPITAL_COMMUNITY): Payer: Medicaid Other

## 2016-12-10 DIAGNOSIS — Z79899 Other long term (current) drug therapy: Secondary | ICD-10-CM | POA: Insufficient documentation

## 2016-12-10 DIAGNOSIS — J449 Chronic obstructive pulmonary disease, unspecified: Secondary | ICD-10-CM | POA: Insufficient documentation

## 2016-12-10 DIAGNOSIS — R05 Cough: Secondary | ICD-10-CM | POA: Diagnosis not present

## 2016-12-10 DIAGNOSIS — N189 Chronic kidney disease, unspecified: Secondary | ICD-10-CM | POA: Insufficient documentation

## 2016-12-10 DIAGNOSIS — J45909 Unspecified asthma, uncomplicated: Secondary | ICD-10-CM | POA: Diagnosis not present

## 2016-12-10 DIAGNOSIS — R059 Cough, unspecified: Secondary | ICD-10-CM

## 2016-12-10 DIAGNOSIS — R0789 Other chest pain: Secondary | ICD-10-CM | POA: Diagnosis not present

## 2016-12-10 DIAGNOSIS — F17200 Nicotine dependence, unspecified, uncomplicated: Secondary | ICD-10-CM | POA: Diagnosis not present

## 2016-12-10 DIAGNOSIS — R0602 Shortness of breath: Secondary | ICD-10-CM | POA: Diagnosis present

## 2016-12-10 LAB — I-STAT TROPONIN, ED: Troponin i, poc: 0 ng/mL (ref 0.00–0.08)

## 2016-12-10 LAB — CBC
HEMATOCRIT: 37.6 % (ref 36.0–46.0)
HEMOGLOBIN: 12.1 g/dL (ref 12.0–15.0)
MCH: 30 pg (ref 26.0–34.0)
MCHC: 32.2 g/dL (ref 30.0–36.0)
MCV: 93.1 fL (ref 78.0–100.0)
Platelets: 241 10*3/uL (ref 150–400)
RBC: 4.04 MIL/uL (ref 3.87–5.11)
RDW: 14.6 % (ref 11.5–15.5)
WBC: 8.6 10*3/uL (ref 4.0–10.5)

## 2016-12-10 LAB — BASIC METABOLIC PANEL
Anion gap: 8 (ref 5–15)
BUN: 5 mg/dL — AB (ref 6–20)
CHLORIDE: 110 mmol/L (ref 101–111)
CO2: 21 mmol/L — AB (ref 22–32)
Calcium: 8.4 mg/dL — ABNORMAL LOW (ref 8.9–10.3)
Creatinine, Ser: 0.9 mg/dL (ref 0.44–1.00)
GFR calc Af Amer: >60 mL/min (ref >60)
Glucose, Bld: 71 mg/dL (ref 65–99)
POTASSIUM: 3.9 mmol/L (ref 3.5–5.1)
Sodium: 139 mmol/L (ref 135–145)

## 2016-12-10 MED ORDER — PREDNISONE 20 MG PO TABS
60.0000 mg | ORAL_TABLET | Freq: Once | ORAL | Status: AC
  Administered 2016-12-10: 60 mg via ORAL
  Filled 2016-12-10: qty 3

## 2016-12-10 MED ORDER — IBUPROFEN 800 MG PO TABS
800.0000 mg | ORAL_TABLET | Freq: Once | ORAL | Status: AC
  Administered 2016-12-10: 800 mg via ORAL
  Filled 2016-12-10: qty 1

## 2016-12-10 MED ORDER — HYDROXYZINE HCL 50 MG PO TABS: 50.0000 mg | ORAL_TABLET | Freq: Three times a day (TID) | ORAL | 0 refills | Status: DC

## 2016-12-10 MED ORDER — ALBUTEROL SULFATE (2.5 MG/3ML) 0.083% IN NEBU
5.0000 mg | INHALATION_SOLUTION | Freq: Once | RESPIRATORY_TRACT | Status: AC
  Administered 2016-12-10: 5 mg via RESPIRATORY_TRACT
  Filled 2016-12-10: qty 6

## 2016-12-10 MED ORDER — ALBUTEROL SULFATE HFA 108 (90 BASE) MCG/ACT IN AERS
2.0000 | INHALATION_SPRAY | Freq: Once | RESPIRATORY_TRACT | Status: AC
  Administered 2016-12-10: 2 via RESPIRATORY_TRACT
  Filled 2016-12-10: qty 6.7

## 2016-12-10 MED ORDER — IPRATROPIUM BROMIDE 0.02 % IN SOLN
0.5000 mg | Freq: Once | RESPIRATORY_TRACT | Status: AC
  Administered 2016-12-10: 0.5 mg via RESPIRATORY_TRACT
  Filled 2016-12-10: qty 2.5

## 2016-12-10 MED ORDER — AZITHROMYCIN 250 MG PO TABS: 250.0000 mg | ORAL_TABLET | Freq: Every day | ORAL | 0 refills | Status: DC

## 2016-12-10 MED ORDER — ALBUTEROL SULFATE HFA 108 (90 BASE) MCG/ACT IN AERS: 1.0000 | INHALATION_SPRAY | Freq: Four times a day (QID) | RESPIRATORY_TRACT | 0 refills | Status: DC | PRN

## 2016-12-10 MED ORDER — PREDNISONE 20 MG PO TABS: ORAL_TABLET | ORAL | 0 refills | Status: DC

## 2016-12-10 MED ORDER — GABAPENTIN 800 MG PO TABS: 800.0000 mg | ORAL_TABLET | Freq: Three times a day (TID) | ORAL | 0 refills | Status: DC
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
## 2016-12-10 NOTE — ED Notes (Signed)
Patient ambulated without difficulty to restroom to urinate prior to nebulizer treatment.

## 2016-12-10 NOTE — ED Triage Notes (Signed)
Pt states sob, throat pain and chest tightness x 2 days.  States has not had any inhalers for her asthma since she was released from prison on 6/8.  VS stable.

## 2016-12-10 NOTE — ED Notes (Signed)
Pt denied dressing into gown, pt stated "doesn't like getting undressed and having to get dressed back in her normal clothes" notified Bonnie(RN)

## 2016-12-10 NOTE — ED Notes (Signed)
Patient refused discharge vital signs. 

## 2016-12-10 NOTE — Discharge Instructions (Signed)
Take the prescribed medication as directed.  Use the inhaler when needed for wheezing. Follow-up with the wellness clinic-- can call for an appt. Return to the ED for new or worsening symptoms.

## 2017-02-02 ENCOUNTER — Emergency Department (HOSPITAL_COMMUNITY)
Admission: EM | Admit: 2017-02-02 | Discharge: 2017-02-02 | Discharge status: Against Medical Advice | Payer: Medicaid Other | Attending: Emergency Medicine | Admitting: Emergency Medicine

## 2017-02-02 ENCOUNTER — Encounter (HOSPITAL_COMMUNITY): Payer: Self-pay | Admitting: Emergency Medicine

## 2017-02-02 DIAGNOSIS — Z5321 Procedure and treatment not carried out due to patient leaving prior to being seen by health care provider: Secondary | ICD-10-CM

## 2017-02-02 DIAGNOSIS — R0602 Shortness of breath: Secondary | ICD-10-CM | POA: Diagnosis present

## 2017-02-02 MED ORDER — ALBUTEROL SULFATE (2.5 MG/3ML) 0.083% IN NEBU
5.0000 mg | INHALATION_SOLUTION | Freq: Once | RESPIRATORY_TRACT | Status: AC
  Administered 2017-02-02: 5 mg via RESPIRATORY_TRACT
  Filled 2017-02-02: qty 6

## 2017-02-02 NOTE — ED Triage Notes (Signed)
Pt here for SOB, hx of asthma and COPD, takes albuterol, Pro-Air, and Combivent normally but is out of all inhalers. Expiratory wheezing throughout.

## 2017-02-02 NOTE — ED Notes (Signed)
No answer when pt was called back to room

## 2017-02-03 NOTE — ED Provider Notes (Signed)
Patient bedded in EPIC. I assigned myself to the patient and attempted to enter room but pt reportedly left waiting room before being brought back. I was unable to evaluate this patient.   Shaune PollackIsaacs, Pratham Cassatt, MD 02/03/17 1122

## 2017-02-15 DIAGNOSIS — F1420 Cocaine dependence, uncomplicated: Secondary | ICD-10-CM | POA: Insufficient documentation

## 2017-03-09 ENCOUNTER — Encounter (HOSPITAL_COMMUNITY): Payer: Self-pay | Admitting: Emergency Medicine

## 2017-03-09 ENCOUNTER — Emergency Department (HOSPITAL_COMMUNITY)
Admission: EM | Admit: 2017-03-09 | Discharge: 2017-03-09 | Disposition: A | Discharge status: Home / Self Care | Payer: Medicaid Other | Attending: Emergency Medicine | Admitting: Emergency Medicine

## 2017-03-09 ENCOUNTER — Emergency Department (HOSPITAL_COMMUNITY): Payer: Medicaid Other

## 2017-03-09 DIAGNOSIS — F419 Anxiety disorder, unspecified: Secondary | ICD-10-CM | POA: Diagnosis not present

## 2017-03-09 DIAGNOSIS — E876 Hypokalemia: Secondary | ICD-10-CM | POA: Diagnosis not present

## 2017-03-09 DIAGNOSIS — Z79899 Other long term (current) drug therapy: Secondary | ICD-10-CM | POA: Diagnosis not present

## 2017-03-09 DIAGNOSIS — M79604 Pain in right leg: Secondary | ICD-10-CM | POA: Diagnosis not present

## 2017-03-09 DIAGNOSIS — F1010 Alcohol abuse, uncomplicated: Secondary | ICD-10-CM | POA: Insufficient documentation

## 2017-03-09 DIAGNOSIS — M79602 Pain in left arm: Secondary | ICD-10-CM | POA: Diagnosis not present

## 2017-03-09 DIAGNOSIS — F1410 Cocaine abuse, uncomplicated: Secondary | ICD-10-CM | POA: Insufficient documentation

## 2017-03-09 DIAGNOSIS — J449 Chronic obstructive pulmonary disease, unspecified: Secondary | ICD-10-CM | POA: Diagnosis not present

## 2017-03-09 DIAGNOSIS — J45909 Unspecified asthma, uncomplicated: Secondary | ICD-10-CM | POA: Insufficient documentation

## 2017-03-09 DIAGNOSIS — Z0471 Encounter for examination and observation following alleged adult physical abuse: Secondary | ICD-10-CM | POA: Insufficient documentation

## 2017-03-09 DIAGNOSIS — F17210 Nicotine dependence, cigarettes, uncomplicated: Secondary | ICD-10-CM | POA: Insufficient documentation

## 2017-03-09 DIAGNOSIS — R0602 Shortness of breath: Secondary | ICD-10-CM | POA: Diagnosis present

## 2017-03-09 LAB — I-STAT CHEM 8, ED
BUN: 9 mg/dL (ref 6–20)
CHLORIDE: 105 mmol/L (ref 101–111)
Calcium, Ion: 1.04 mmol/L — ABNORMAL LOW (ref 1.15–1.40)
Creatinine, Ser: 0.7 mg/dL (ref 0.44–1.00)
Glucose, Bld: 120 mg/dL — ABNORMAL HIGH (ref 65–99)
HEMATOCRIT: 48 % — AB (ref 36.0–46.0)
Hemoglobin: 16.3 g/dL — ABNORMAL HIGH (ref 12.0–15.0)
POTASSIUM: 3.1 mmol/L — AB (ref 3.5–5.1)
SODIUM: 141 mmol/L (ref 135–145)
TCO2: 21 mmol/L — ABNORMAL LOW (ref 22–32)

## 2017-03-09 LAB — RAPID URINE DRUG SCREEN, HOSP PERFORMED
Amphetamines: NOT DETECTED
BENZODIAZEPINES: POSITIVE — AB
Barbiturates: NOT DETECTED
COCAINE: POSITIVE — AB
OPIATES: NOT DETECTED
Tetrahydrocannabinol: NOT DETECTED

## 2017-03-09 MED ORDER — POTASSIUM CHLORIDE ER 10 MEQ PO TBCR
10.0000 meq | EXTENDED_RELEASE_TABLET | Freq: Every day | ORAL | 0 refills | Status: DC
Start: 2017-03-09 — End: 2017-03-09

## 2017-03-09 MED ORDER — HYDROXYZINE HCL 25 MG PO TABS: 25.0000 mg | ORAL_TABLET | Freq: Four times a day (QID) | ORAL | 0 refills | Status: DC | PRN

## 2017-03-09 MED ORDER — POTASSIUM CHLORIDE ER 10 MEQ PO TBCR: 10.0000 meq | EXTENDED_RELEASE_TABLET | Freq: Every day | ORAL | 0 refills | Status: DC

## 2017-03-09 MED ORDER — IPRATROPIUM-ALBUTEROL 0.5-2.5 (3) MG/3ML IN SOLN
3.0000 mL | Freq: Once | RESPIRATORY_TRACT | Status: AC
  Administered 2017-03-09: 3 mL via RESPIRATORY_TRACT
  Filled 2017-03-09: qty 3

## 2017-03-09 MED ORDER — HYDROXYZINE HCL 10 MG PO TABS
10.0000 mg | ORAL_TABLET | Freq: Once | ORAL | Status: AC
  Administered 2017-03-09: 10 mg via ORAL
  Filled 2017-03-09: qty 1

## 2017-03-09 MED ORDER — IBUPROFEN 200 MG PO TABS
400.0000 mg | ORAL_TABLET | Freq: Once | ORAL | Status: AC
  Administered 2017-03-09: 400 mg via ORAL
  Filled 2017-03-09: qty 2

## 2017-03-09 MED ORDER — CHLORDIAZEPOXIDE HCL 25 MG PO CAPS: ORAL_CAPSULE | ORAL | 0 refills | Status: DC

## 2017-03-09 MED ORDER — LOPERAMIDE HCL 2 MG PO CAPS
2.0000 mg | ORAL_CAPSULE | Freq: Once | ORAL | Status: AC
  Administered 2017-03-09: 2 mg via ORAL
  Filled 2017-03-09: qty 1

## 2017-03-09 MED ORDER — ALBUTEROL SULFATE HFA 108 (90 BASE) MCG/ACT IN AERS
1.0000 | INHALATION_SPRAY | Freq: Once | RESPIRATORY_TRACT | Status: AC
Start: 2017-03-09 — End: 2017-03-09
  Administered 2017-03-09: 1 via RESPIRATORY_TRACT
  Filled 2017-03-09: qty 6.7

## 2017-03-09 NOTE — ED Notes (Signed)
Pt states she drinks every night all night long and has not been able to drink so she is having withdrawal symptoms of diarrhea.  Pt needed us to stop her breathing treatment in order to go to bathroom.  In addition, lab called and stated there was only enough urine for UDS.  Will collect more for UA when possible.

## 2017-03-09 NOTE — ED Notes (Signed)
Pt ambulatory to restroom

## 2017-03-09 NOTE — Discharge Instructions (Signed)
Stay well hydrated with water.  Stop drinking alcohol and stop using cocaine.  You may use vistaril as needed for itching and anxiety.  Take potassium supplement every day for the next 10 days.  Use your inhaler as needed for shortness of breath and chest tightness.  It is very important that you follow up with [facility] for evaluation of your asthma/COPD and for refill of your medications.  Return to the ER if you develop persistent chest pain or any new or worsening symptoms.

## 2017-03-09 NOTE — ED Notes (Signed)
Bed: WA25 Expected date:  Expected time:  Means of arrival:  Comments: 

## 2017-03-09 NOTE — ED Notes (Signed)
Patient given sandwich and coke. Social work at bedside
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
## 2017-03-09 NOTE — ED Notes (Signed)
X ray and respiratory at bedside.

## 2017-03-09 NOTE — ED Notes (Signed)
Called respiratory for breathing treatment

## 2017-03-09 NOTE — ED Notes (Signed)
Called social work for consult

## 2017-03-09 NOTE — ED Triage Notes (Signed)
Per EMS-states she has been having asthma symptoms for over 3 weeks-states [place] and became more SOB-states has been out of her inhaler for over a month
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
## 2017-03-09 NOTE — ED Notes (Signed)
Pt ambulatory and independent at discharge.  Verbalized understanding of discharge instructions.  Pt given bus pass from social work.
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
## 2017-03-17 ENCOUNTER — Emergency Department (HOSPITAL_COMMUNITY)
Admission: EM | Admit: 2017-03-17 | Discharge: 2017-03-17 | Discharge status: Against Medical Advice | Payer: Medicaid Other | Attending: Emergency Medicine | Admitting: Emergency Medicine

## 2017-03-17 ENCOUNTER — Encounter (HOSPITAL_COMMUNITY): Payer: Self-pay | Admitting: Emergency Medicine

## 2017-03-17 DIAGNOSIS — Z5321 Procedure and treatment not carried out due to patient leaving prior to being seen by health care provider: Secondary | ICD-10-CM | POA: Insufficient documentation

## 2017-03-17 DIAGNOSIS — J45909 Unspecified asthma, uncomplicated: Secondary | ICD-10-CM | POA: Diagnosis present

## 2017-03-17 NOTE — ED Notes (Signed)
Bed: [facility] Expected date: 03/17/17 Expected time:  Means of arrival:  Comments:
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
## 2017-05-13 ENCOUNTER — Emergency Department (HOSPITAL_COMMUNITY)
Admission: EM | Admit: 2017-05-13 | Discharge: 2017-05-13 | Discharge status: Against Medical Advice | Payer: Medicaid Other

## 2017-05-13 ENCOUNTER — Encounter (HOSPITAL_COMMUNITY): Payer: Self-pay | Admitting: *Deleted

## 2017-05-13 ENCOUNTER — Emergency Department (HOSPITAL_COMMUNITY)
Admission: EM | Admit: 2017-05-13 | Discharge: 2017-05-13 | Disposition: A | Discharge status: Home / Self Care | Payer: Medicaid Other | Attending: Emergency Medicine | Admitting: Emergency Medicine

## 2017-05-13 DIAGNOSIS — Z79899 Other long term (current) drug therapy: Secondary | ICD-10-CM | POA: Diagnosis not present

## 2017-05-13 DIAGNOSIS — N189 Chronic kidney disease, unspecified: Secondary | ICD-10-CM | POA: Diagnosis not present

## 2017-05-13 DIAGNOSIS — Z0289 Encounter for other administrative examinations: Secondary | ICD-10-CM | POA: Diagnosis not present

## 2017-05-13 DIAGNOSIS — F17200 Nicotine dependence, unspecified, uncomplicated: Secondary | ICD-10-CM | POA: Insufficient documentation

## 2017-05-13 DIAGNOSIS — J449 Chronic obstructive pulmonary disease, unspecified: Secondary | ICD-10-CM | POA: Insufficient documentation

## 2017-05-13 DIAGNOSIS — F10220 Alcohol dependence with intoxication, uncomplicated: Secondary | ICD-10-CM | POA: Diagnosis present

## 2017-05-13 DIAGNOSIS — F10920 Alcohol use, unspecified with intoxication, uncomplicated: Secondary | ICD-10-CM | POA: Diagnosis not present

## 2017-05-13 DIAGNOSIS — R0602 Shortness of breath: Secondary | ICD-10-CM | POA: Insufficient documentation

## 2017-05-13 DIAGNOSIS — J45909 Unspecified asthma, uncomplicated: Secondary | ICD-10-CM | POA: Diagnosis not present

## 2017-05-13 LAB — COMPREHENSIVE METABOLIC PANEL
ALK PHOS: 105 U/L (ref 38–126)
ALT: 61 U/L — AB (ref 14–54)
AST: 62 U/L — AB (ref 15–41)
Albumin: 3.6 g/dL (ref 3.5–5.0)
Anion gap: 12 (ref 5–15)
BILIRUBIN TOTAL: 0.6 mg/dL (ref 0.3–1.2)
BUN: 6 mg/dL (ref 6–20)
CALCIUM: 8.7 mg/dL — AB (ref 8.9–10.3)
CO2: 24 mmol/L (ref 22–32)
CREATININE: 0.84 mg/dL (ref 0.44–1.00)
Chloride: 97 mmol/L — ABNORMAL LOW (ref 101–111)
Glucose, Bld: 104 mg/dL — ABNORMAL HIGH (ref 65–99)
Potassium: 3.3 mmol/L — ABNORMAL LOW (ref 3.5–5.1)
Sodium: 133 mmol/L — ABNORMAL LOW (ref 135–145)
TOTAL PROTEIN: 7.5 g/dL (ref 6.5–8.1)

## 2017-05-13 LAB — CBC WITH DIFFERENTIAL/PLATELET
Basophils Absolute: 0 10*3/uL (ref 0.0–0.1)
Basophils Relative: 0 %
EOS PCT: 2 %
Eosinophils Absolute: 0.3 10*3/uL (ref 0.0–0.7)
HEMATOCRIT: 41.7 % (ref 36.0–46.0)
HEMOGLOBIN: 14.3 g/dL (ref 12.0–15.0)
LYMPHS ABS: 4.7 10*3/uL — AB (ref 0.7–4.0)
LYMPHS PCT: 41 %
MCH: 32.6 pg (ref 26.0–34.0)
MCHC: 34.3 g/dL (ref 30.0–36.0)
MCV: 95 fL (ref 78.0–100.0)
Monocytes Absolute: 0.7 10*3/uL (ref 0.1–1.0)
Monocytes Relative: 6 %
NEUTROS ABS: 5.6 10*3/uL (ref 1.7–7.7)
Neutrophils Relative %: 51 %
Platelets: 186 10*3/uL (ref 150–400)
RBC: 4.39 MIL/uL (ref 3.87–5.11)
RDW: 13.9 % (ref 11.5–15.5)
WBC: 11.2 10*3/uL — AB (ref 4.0–10.5)

## 2017-05-13 LAB — URINALYSIS, ROUTINE W REFLEX MICROSCOPIC
Bilirubin Urine: NEGATIVE
GLUCOSE, UA: NEGATIVE mg/dL
KETONES UR: NEGATIVE mg/dL
Nitrite: NEGATIVE
PH: 6 (ref 5.0–8.0)
Protein, ur: NEGATIVE mg/dL
SPECIFIC GRAVITY, URINE: 1.004 — AB (ref 1.005–1.030)

## 2017-05-13 MED ORDER — CHLORDIAZEPOXIDE HCL 25 MG PO CAPS
50.0000 mg | ORAL_CAPSULE | Freq: Once | ORAL | Status: AC
  Administered 2017-05-13: 50 mg via ORAL
  Filled 2017-05-13: qty 2

## 2017-05-13 MED ORDER — LORAZEPAM 1 MG PO TABS: 0.0000 mg | ORAL_TABLET | Freq: Two times a day (BID) | ORAL | Status: DC

## 2017-05-13 MED ORDER — LORAZEPAM 2 MG/ML IJ SOLN: 0.0000 mg | Freq: Two times a day (BID) | INTRAMUSCULAR | Status: DC

## 2017-05-13 MED ORDER — HYDROXYZINE HCL 25 MG PO TABS: 25.0000 mg | ORAL_TABLET | Freq: Four times a day (QID) | ORAL | 0 refills | Status: DC | PRN

## 2017-05-13 MED ORDER — ALBUTEROL SULFATE HFA 108 (90 BASE) MCG/ACT IN AERS
2.0000 | INHALATION_SPRAY | Freq: Once | RESPIRATORY_TRACT | Status: AC
  Administered 2017-05-13: 2 via RESPIRATORY_TRACT
  Filled 2017-05-13: qty 6.7

## 2017-05-13 MED ORDER — LORAZEPAM 2 MG/ML IJ SOLN: 0.0000 mg | Freq: Four times a day (QID) | INTRAMUSCULAR | Status: DC

## 2017-05-13 MED ORDER — CHLORDIAZEPOXIDE HCL 25 MG PO CAPS: ORAL_CAPSULE | ORAL | 0 refills | Status: DC

## 2017-05-13 MED ORDER — THIAMINE HCL 100 MG/ML IJ SOLN: 100.0000 mg | Freq: Every day | INTRAMUSCULAR | Status: DC

## 2017-05-13 MED ORDER — LORAZEPAM 1 MG PO TABS: 0.0000 mg | ORAL_TABLET | Freq: Four times a day (QID) | ORAL | Status: DC

## 2017-05-13 MED ORDER — ALBUTEROL SULFATE HFA 108 (90 BASE) MCG/ACT IN AERS: 1.0000 | INHALATION_SPRAY | Freq: Four times a day (QID) | RESPIRATORY_TRACT | 0 refills | Status: DC | PRN

## 2017-05-13 MED ORDER — VITAMIN B-1 100 MG PO TABS
100.0000 mg | ORAL_TABLET | Freq: Every day | ORAL | Status: DC
  Administered 2017-05-13: 100 mg via ORAL
  Filled 2017-05-13: qty 1

## 2017-05-13 MED ORDER — IPRATROPIUM-ALBUTEROL 0.5-2.5 (3) MG/3ML IN SOLN
3.0000 mL | Freq: Once | RESPIRATORY_TRACT | Status: AC
Start: 2017-05-13 — End: 2017-05-13
  Administered 2017-05-13: 3 mL via RESPIRATORY_TRACT
  Filled 2017-05-13: qty 3

## 2017-05-13 NOTE — ED Notes (Signed)
Called for triage no response 

## 2017-05-13 NOTE — ED Notes (Signed)
Pt is eating for the 3rd time.

## 2017-05-13 NOTE — Discharge Instructions (Signed)
You are provided resources for substance abuse rehab and detox.  Your Librium is to help you detox from alcohol.  Please return without fail for worsening symptoms, including seizures, intractable vomiting, confusion, or any other symptoms concerning to you.

## 2017-05-13 NOTE — ED Triage Notes (Signed)
Pt states she would like "alcohol pills" and an inhaler. Pt states she is currently drunk. Pt would also like something for pain in her left arm. Pt states she is trying to get into a rehab program and needs alcohol pills to get in.

## 2017-05-13 NOTE — ED Notes (Signed)
Pt called,no answer.

## 2017-05-13 NOTE — ED Notes (Signed)
Bed: [facility] Expected date:  Expected time:  Means of arrival:  Comments: 
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
## 2017-05-25 ENCOUNTER — Emergency Department (HOSPITAL_COMMUNITY)
Admission: EM | Admit: 2017-05-25 | Discharge: 2017-05-25 | Disposition: A | Discharge status: Home / Self Care | Payer: Medicaid Other | Attending: Emergency Medicine | Admitting: Emergency Medicine

## 2017-05-25 ENCOUNTER — Encounter (HOSPITAL_COMMUNITY): Payer: Self-pay | Admitting: Emergency Medicine

## 2017-05-25 DIAGNOSIS — R109 Unspecified abdominal pain: Secondary | ICD-10-CM | POA: Diagnosis present

## 2017-05-25 DIAGNOSIS — Z5321 Procedure and treatment not carried out due to patient leaving prior to being seen by health care provider: Secondary | ICD-10-CM | POA: Insufficient documentation

## 2017-05-25 NOTE — ED Notes (Signed)
Attempted to reconnect patient to cardiac monitor. Patient refused stating "Give me a phone, I am calling someone to get me. I am in pain."

## 2017-05-25 NOTE — ED Triage Notes (Signed)
Pt then tried to take 6 500mg  tylenol. Explained is going to cause liver damage. Pt being argumentative and threw tylenol on the floor. Pt then took out the bottle again and took 3 tablets despite RN and manufacturers device. Security and Consulting civil engineercharge RN called.  Pt to go to lobby to wait for room per charge.

## 2017-05-25 NOTE — ED Triage Notes (Addendum)
Per EMS. Pt complains of upper abd pain for the past 10 days accompanied by n/v/d. Pt told EMS it reminded her of the pain she gets from when she drinks etoh, but now it hurts all the time. Pt asking EMS for demerol. Pt ambulatory to the restroom in triage. Pt threatening to call EMS because she cannot get into treatment room right away. Explained reason for delay and that protocol orders are being put in. Offered ibuprofen for pain, but pt refused. Pt took home pain medication during triage.

## 2017-05-25 NOTE — ED Notes (Signed)
PT is refusing EKG Writer stated that her EKG was abnormal and the doctor would like a repeat. Pt began cussing amd stating that we wasn't doing anything for her and then proceeded to call a ride to come and pick her up from the hospital.

## 2017-05-29 ENCOUNTER — Other Ambulatory Visit: Payer: Self-pay

## 2017-05-29 ENCOUNTER — Encounter (HOSPITAL_COMMUNITY): Payer: Self-pay | Admitting: Emergency Medicine

## 2017-05-29 ENCOUNTER — Emergency Department (HOSPITAL_COMMUNITY)
Admission: EM | Admit: 2017-05-29 | Discharge: 2017-05-29 | Disposition: A | Discharge status: Home / Self Care | Payer: Medicaid Other | Attending: Emergency Medicine | Admitting: Emergency Medicine

## 2017-05-29 ENCOUNTER — Emergency Department (HOSPITAL_COMMUNITY): Payer: Medicaid Other

## 2017-05-29 DIAGNOSIS — N189 Chronic kidney disease, unspecified: Secondary | ICD-10-CM | POA: Insufficient documentation

## 2017-05-29 DIAGNOSIS — J45909 Unspecified asthma, uncomplicated: Secondary | ICD-10-CM | POA: Diagnosis not present

## 2017-05-29 DIAGNOSIS — F17200 Nicotine dependence, unspecified, uncomplicated: Secondary | ICD-10-CM | POA: Diagnosis not present

## 2017-05-29 DIAGNOSIS — K2990 Gastroduodenitis, unspecified, without bleeding: Secondary | ICD-10-CM | POA: Diagnosis not present

## 2017-05-29 DIAGNOSIS — R112 Nausea with vomiting, unspecified: Secondary | ICD-10-CM | POA: Diagnosis present

## 2017-05-29 DIAGNOSIS — Z79899 Other long term (current) drug therapy: Secondary | ICD-10-CM | POA: Insufficient documentation

## 2017-05-29 DIAGNOSIS — J449 Chronic obstructive pulmonary disease, unspecified: Secondary | ICD-10-CM | POA: Insufficient documentation

## 2017-05-29 LAB — COMPREHENSIVE METABOLIC PANEL
ALK PHOS: 109 U/L (ref 38–126)
ALT: 27 U/L (ref 14–54)
AST: 33 U/L (ref 15–41)
Albumin: 3.5 g/dL (ref 3.5–5.0)
Anion gap: 9 (ref 5–15)
BILIRUBIN TOTAL: 0.7 mg/dL (ref 0.3–1.2)
BUN: 7 mg/dL (ref 6–20)
CALCIUM: 9.1 mg/dL (ref 8.9–10.3)
CO2: 27 mmol/L (ref 22–32)
Chloride: 104 mmol/L (ref 101–111)
Creatinine, Ser: 0.95 mg/dL (ref 0.44–1.00)
GFR calc Af Amer: >60 mL/min (ref >60)
GFR calc non Af Amer: >60 mL/min (ref >60)
GLUCOSE: 101 mg/dL — AB (ref 65–99)
Potassium: 3.3 mmol/L — ABNORMAL LOW (ref 3.5–5.1)
SODIUM: 140 mmol/L (ref 135–145)
TOTAL PROTEIN: 7.5 g/dL (ref 6.5–8.1)

## 2017-05-29 LAB — LIPASE, BLOOD: Lipase: 29 U/L (ref 11–51)

## 2017-05-29 LAB — URINALYSIS, ROUTINE W REFLEX MICROSCOPIC
BILIRUBIN URINE: NEGATIVE
Glucose, UA: NEGATIVE mg/dL
HGB URINE DIPSTICK: NEGATIVE
KETONES UR: 5 mg/dL — AB
Leukocytes, UA: NEGATIVE
Nitrite: NEGATIVE
PH: 6 (ref 5.0–8.0)
Protein, ur: NEGATIVE mg/dL
SPECIFIC GRAVITY, URINE: 1.034 — AB (ref 1.005–1.030)

## 2017-05-29 LAB — CBC
HEMATOCRIT: 44 % (ref 36.0–46.0)
Hemoglobin: 15.1 g/dL — ABNORMAL HIGH (ref 12.0–15.0)
MCH: 33.8 pg (ref 26.0–34.0)
MCHC: 34.3 g/dL (ref 30.0–36.0)
MCV: 98.4 fL (ref 78.0–100.0)
Platelets: 365 10*3/uL (ref 150–400)
RBC: 4.47 MIL/uL (ref 3.87–5.11)
RDW: 15.1 % (ref 11.5–15.5)
WBC: 12.3 10*3/uL — ABNORMAL HIGH (ref 4.0–10.5)

## 2017-05-29 LAB — I-STAT TROPONIN, ED: Troponin i, poc: 0 ng/mL (ref 0.00–0.08)

## 2017-05-29 LAB — I-STAT BETA HCG BLOOD, ED (MC, WL, AP ONLY): I-stat hCG, quantitative: <5 m[IU]/mL (ref <5)

## 2017-05-29 MED ORDER — HYDROXYZINE HCL 25 MG PO TABS: 25.0000 mg | ORAL_TABLET | Freq: Four times a day (QID) | ORAL | 0 refills | Status: DC | PRN

## 2017-05-29 MED ORDER — ALBUTEROL SULFATE HFA 108 (90 BASE) MCG/ACT IN AERS
2.0000 | INHALATION_SPRAY | Freq: Once | RESPIRATORY_TRACT | Status: AC
  Administered 2017-05-29: 2 via RESPIRATORY_TRACT
  Filled 2017-05-29: qty 6.7

## 2017-05-29 MED ORDER — IOPAMIDOL (ISOVUE-300) INJECTION 61%
100.0000 mL | Freq: Once | INTRAVENOUS | Status: AC | PRN
  Administered 2017-05-29: 100 mL via INTRAVENOUS

## 2017-05-29 MED ORDER — PANTOPRAZOLE SODIUM 20 MG PO TBEC: 20.0000 mg | DELAYED_RELEASE_TABLET | Freq: Every day | ORAL | 0 refills | Status: DC

## 2017-05-29 MED ORDER — MORPHINE SULFATE (PF) 4 MG/ML IV SOLN
4.0000 mg | Freq: Once | INTRAVENOUS | Status: AC
  Administered 2017-05-29: 4 mg via INTRAVENOUS
  Filled 2017-05-29: qty 1

## 2017-05-29 MED ORDER — LORAZEPAM 2 MG/ML IJ SOLN
0.5000 mg | Freq: Once | INTRAMUSCULAR | Status: AC
  Administered 2017-05-29: 0.5 mg via INTRAVENOUS
  Filled 2017-05-29: qty 1

## 2017-05-29 MED ORDER — IOPAMIDOL (ISOVUE-300) INJECTION 61%
INTRAVENOUS | Status: AC
  Administered 2017-05-29: 100 mL via INTRAVENOUS
  Filled 2017-05-29: qty 100

## 2017-05-29 MED ORDER — SODIUM CHLORIDE 0.9 % IV BOLUS (SEPSIS)
1000.0000 mL | Freq: Once | INTRAVENOUS | Status: AC
  Administered 2017-05-29: 1000 mL via INTRAVENOUS

## 2017-05-29 MED ORDER — ONDANSETRON 4 MG PO TBDP: 4.0000 mg | ORAL_TABLET | Freq: Three times a day (TID) | ORAL | 0 refills | Status: DC | PRN

## 2017-05-29 MED ORDER — ONDANSETRON HCL 4 MG/2ML IJ SOLN
4.0000 mg | Freq: Once | INTRAMUSCULAR | Status: AC
  Administered 2017-05-29: 4 mg via INTRAVENOUS
  Filled 2017-05-29: qty 2

## 2017-05-29 MED ORDER — SUCRALFATE 1 G PO TABS: 1.0000 g | ORAL_TABLET | Freq: Three times a day (TID) | ORAL | 0 refills | Status: DC

## 2017-05-29 MED ORDER — PANTOPRAZOLE SODIUM 40 MG IV SOLR
40.0000 mg | Freq: Once | INTRAVENOUS | Status: AC
  Administered 2017-05-29: 40 mg via INTRAVENOUS
  Filled 2017-05-29: qty 40

## 2017-05-29 MED ORDER — CHLORDIAZEPOXIDE HCL 25 MG PO CAPS: ORAL_CAPSULE | ORAL | 0 refills | Status: DC

## 2017-05-29 MED ORDER — SUCRALFATE 1 G PO TABS
1.0000 g | ORAL_TABLET | Freq: Once | ORAL | Status: AC
  Administered 2017-05-29: 1 g via ORAL
  Filled 2017-05-29: qty 1

## 2017-05-29 NOTE — ED Notes (Signed)
EDPA Provider at bedside. 
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
## 2017-05-29 NOTE — ED Notes (Signed)
Bed: WTR6 Expected date:  Expected time:  Means of arrival:  Comments: 

## 2017-05-29 NOTE — Discharge Instructions (Signed)
It was my pleasure taking care of you today!   Take Protonix daily. Carafate as directed for abdominal pain. Zofran as needed for nausea.  Librium is the medication to help you detox off of alcohol.   Stay hydrated.   Return to ER for new or worsening symptoms, any additional concerns.   It is very important that you follow up with a primary care provider! See the information below:  To find a primary care or specialty doctor please call [phone] or [phone] to access "[place] Find a Doctor Service."  You may also go on the [facility] website at [email or web address]  There are also multiple [facility], [facility] and [facility] practices throughout the [place] that are frequently accepting new patients. You may find a clinic that is close to your home and contact them.  [facility] - [address] [id number] [phone]  [facility] in [place] (also locations in [place] and [place]) - [address] [place] [place] [phone]  [facility] - [address] [place] [phone]

## 2017-05-29 NOTE — ED Triage Notes (Signed)
Per EMS [facility] and had employee call EMS for complaint of abdominal pain with associated n/v for 2 weeks. BM yesterday.

## 2018-01-14 ENCOUNTER — Inpatient Hospital Stay (HOSPITAL_COMMUNITY)
Admission: EM | Admit: 2018-01-14 | Discharge: 2018-01-21 | DRG: 897 | Disposition: A | Discharge status: Other Acute Inpatient Hospital | Payer: Medicaid Other | Attending: Internal Medicine | Admitting: Internal Medicine

## 2018-01-14 ENCOUNTER — Emergency Department (HOSPITAL_COMMUNITY): Payer: Medicaid Other

## 2018-01-14 ENCOUNTER — Other Ambulatory Visit: Payer: Self-pay

## 2018-01-14 ENCOUNTER — Encounter (HOSPITAL_COMMUNITY): Payer: Self-pay | Admitting: Emergency Medicine

## 2018-01-14 DIAGNOSIS — G629 Polyneuropathy, unspecified: Secondary | ICD-10-CM | POA: Diagnosis present

## 2018-01-14 DIAGNOSIS — F1910 Other psychoactive substance abuse, uncomplicated: Secondary | ICD-10-CM | POA: Diagnosis present

## 2018-01-14 DIAGNOSIS — R45851 Suicidal ideations: Secondary | ICD-10-CM | POA: Diagnosis present

## 2018-01-14 DIAGNOSIS — Z79899 Other long term (current) drug therapy: Secondary | ICD-10-CM

## 2018-01-14 DIAGNOSIS — F4310 Post-traumatic stress disorder, unspecified: Secondary | ICD-10-CM | POA: Diagnosis present

## 2018-01-14 DIAGNOSIS — F10930 Alcohol use, unspecified with withdrawal, uncomplicated: Secondary | ICD-10-CM

## 2018-01-14 DIAGNOSIS — F1410 Cocaine abuse, uncomplicated: Secondary | ICD-10-CM | POA: Diagnosis present

## 2018-01-14 DIAGNOSIS — Z915 Personal history of self-harm: Secondary | ICD-10-CM

## 2018-01-14 DIAGNOSIS — F1994 Other psychoactive substance use, unspecified with psychoactive substance-induced mood disorder: Secondary | ICD-10-CM

## 2018-01-14 DIAGNOSIS — F10230 Alcohol dependence with withdrawal, uncomplicated: Principal | ICD-10-CM | POA: Diagnosis present

## 2018-01-14 DIAGNOSIS — G8929 Other chronic pain: Secondary | ICD-10-CM | POA: Diagnosis present

## 2018-01-14 DIAGNOSIS — F410 Panic disorder [episodic paroxysmal anxiety] without agoraphobia: Secondary | ICD-10-CM | POA: Diagnosis present

## 2018-01-14 DIAGNOSIS — F17210 Nicotine dependence, cigarettes, uncomplicated: Secondary | ICD-10-CM | POA: Diagnosis present

## 2018-01-14 DIAGNOSIS — Z590 Homelessness: Secondary | ICD-10-CM

## 2018-01-14 DIAGNOSIS — J449 Chronic obstructive pulmonary disease, unspecified: Secondary | ICD-10-CM | POA: Diagnosis not present

## 2018-01-14 DIAGNOSIS — G4700 Insomnia, unspecified: Secondary | ICD-10-CM | POA: Diagnosis present

## 2018-01-14 DIAGNOSIS — E876 Hypokalemia: Secondary | ICD-10-CM | POA: Diagnosis present

## 2018-01-14 DIAGNOSIS — F064 Anxiety disorder due to known physiological condition: Secondary | ICD-10-CM | POA: Diagnosis present

## 2018-01-14 DIAGNOSIS — Z91410 Personal history of adult physical and sexual abuse: Secondary | ICD-10-CM

## 2018-01-14 DIAGNOSIS — F319 Bipolar disorder, unspecified: Secondary | ICD-10-CM | POA: Diagnosis present

## 2018-01-14 DIAGNOSIS — Z91411 Personal history of adult psychological abuse: Secondary | ICD-10-CM

## 2018-01-14 DIAGNOSIS — F10939 Alcohol use, unspecified with withdrawal, unspecified: Secondary | ICD-10-CM | POA: Diagnosis present

## 2018-01-14 DIAGNOSIS — F10239 Alcohol dependence with withdrawal, unspecified: Secondary | ICD-10-CM | POA: Diagnosis present

## 2018-01-14 DIAGNOSIS — F10229 Alcohol dependence with intoxication, unspecified: Secondary | ICD-10-CM | POA: Diagnosis present

## 2018-01-14 DIAGNOSIS — F1123 Opioid dependence with withdrawal: Secondary | ICD-10-CM | POA: Diagnosis present

## 2018-01-14 LAB — COMPREHENSIVE METABOLIC PANEL
ALBUMIN: 3.4 g/dL — AB (ref 3.5–5.0)
ALT: 50 U/L — ABNORMAL HIGH (ref 0–44)
ANION GAP: 10 (ref 5–15)
AST: 51 U/L — ABNORMAL HIGH (ref 15–41)
Alkaline Phosphatase: 95 U/L (ref 38–126)
BUN: 6 mg/dL (ref 6–20)
CHLORIDE: 113 mmol/L — AB (ref 98–111)
CO2: 23 mmol/L (ref 22–32)
Calcium: 8.7 mg/dL — ABNORMAL LOW (ref 8.9–10.3)
Creatinine, Ser: 0.87 mg/dL (ref 0.44–1.00)
GFR calc Af Amer: >60 mL/min (ref >60)
GFR calc non Af Amer: >60 mL/min (ref >60)
GLUCOSE: 117 mg/dL — AB (ref 70–99)
POTASSIUM: 3.5 mmol/L (ref 3.5–5.1)
Sodium: 146 mmol/L — ABNORMAL HIGH (ref 135–145)
Total Bilirubin: 0.9 mg/dL (ref 0.3–1.2)
Total Protein: 7.5 g/dL (ref 6.5–8.1)

## 2018-01-14 LAB — ETHANOL

## 2018-01-14 LAB — CBC
HEMATOCRIT: 41.8 % (ref 36.0–46.0)
Hemoglobin: 14 g/dL (ref 12.0–15.0)
MCH: 31.1 pg (ref 26.0–34.0)
MCHC: 33.5 g/dL (ref 30.0–36.0)
MCV: 92.9 fL (ref 78.0–100.0)
Platelets: 246 10*3/uL (ref 150–400)
RBC: 4.5 MIL/uL (ref 3.87–5.11)
RDW: 14.7 % (ref 11.5–15.5)
WBC: 7.3 10*3/uL (ref 4.0–10.5)

## 2018-01-14 LAB — I-STAT BETA HCG BLOOD, ED (MC, WL, AP ONLY)

## 2018-01-14 LAB — SALICYLATE LEVEL: Salicylate Lvl: <7 mg/dL (ref 2.8–30.0)

## 2018-01-14 LAB — ACETAMINOPHEN LEVEL

## 2018-01-14 MED ORDER — LORAZEPAM 2 MG/ML IJ SOLN
0.0000 mg | Freq: Four times a day (QID) | INTRAMUSCULAR | Status: DC
  Administered 2018-01-14: 2 mg via INTRAVENOUS
  Filled 2018-01-14: qty 1

## 2018-01-14 MED ORDER — TRAZODONE HCL 100 MG PO TABS
100.0000 mg | ORAL_TABLET | Freq: Every day | ORAL | Status: DC
  Administered 2018-01-15 – 2018-01-20 (×7): 100 mg via ORAL
  Filled 2018-01-14 (×7): qty 1

## 2018-01-14 MED ORDER — VITAMIN B-1 100 MG PO TABS
100.0000 mg | ORAL_TABLET | Freq: Every day | ORAL | Status: DC
  Administered 2018-01-15 – 2018-01-21 (×8): 100 mg via ORAL
  Filled 2018-01-14 (×8): qty 1

## 2018-01-14 MED ORDER — ACETAMINOPHEN 325 MG PO TABS
650.0000 mg | ORAL_TABLET | Freq: Four times a day (QID) | ORAL | Status: DC | PRN
  Administered 2018-01-15 – 2018-01-20 (×4): 650 mg via ORAL
  Filled 2018-01-14 (×4): qty 2

## 2018-01-14 MED ORDER — GABAPENTIN 400 MG PO CAPS
800.0000 mg | ORAL_CAPSULE | Freq: Three times a day (TID) | ORAL | Status: DC
  Administered 2018-01-15 – 2018-01-17 (×8): 800 mg via ORAL
  Filled 2018-01-14 (×8): qty 2

## 2018-01-14 MED ORDER — LORAZEPAM 2 MG/ML IJ SOLN: 0.0000 mg | Freq: Two times a day (BID) | INTRAMUSCULAR | Status: DC

## 2018-01-14 MED ORDER — ONDANSETRON HCL 4 MG/2ML IJ SOLN
4.0000 mg | Freq: Four times a day (QID) | INTRAMUSCULAR | Status: DC | PRN
  Administered 2018-01-15 – 2018-01-20 (×5): 4 mg via INTRAVENOUS
  Filled 2018-01-14 (×5): qty 2

## 2018-01-14 MED ORDER — LORAZEPAM 1 MG PO TABS: 0.0000 mg | ORAL_TABLET | Freq: Two times a day (BID) | ORAL | Status: DC

## 2018-01-14 MED ORDER — PROMETHAZINE HCL 25 MG/ML IJ SOLN
12.5000 mg | Freq: Once | INTRAMUSCULAR | Status: AC
  Administered 2018-01-14: 12.5 mg via INTRAVENOUS
  Filled 2018-01-14: qty 1

## 2018-01-14 MED ORDER — LORAZEPAM 1 MG PO TABS
0.0000 mg | ORAL_TABLET | Freq: Four times a day (QID) | ORAL | Status: AC
  Administered 2018-01-14 – 2018-01-15 (×3): 2 mg via ORAL
  Administered 2018-01-15: 1 mg via ORAL
  Administered 2018-01-15: 2 mg via ORAL
  Administered 2018-01-16 (×2): 1 mg via ORAL
  Filled 2018-01-14 (×2): qty 1
  Filled 2018-01-14 (×2): qty 2
  Filled 2018-01-14: qty 1
  Filled 2018-01-14: qty 2
  Filled 2018-01-14: qty 4

## 2018-01-14 MED ORDER — FOLIC ACID 1 MG PO TABS
1.0000 mg | ORAL_TABLET | Freq: Every day | ORAL | Status: DC
  Administered 2018-01-14 – 2018-01-21 (×8): 1 mg via ORAL
  Filled 2018-01-14 (×8): qty 1

## 2018-01-14 MED ORDER — IPRATROPIUM-ALBUTEROL 0.5-2.5 (3) MG/3ML IN SOLN
3.0000 mL | Freq: Once | RESPIRATORY_TRACT | Status: DC
  Filled 2018-01-14: qty 3

## 2018-01-14 MED ORDER — HYDROXYZINE HCL 25 MG PO TABS
100.0000 mg | ORAL_TABLET | Freq: Three times a day (TID) | ORAL | Status: DC | PRN
Start: 2018-01-14 — End: 2018-01-21
  Administered 2018-01-15 – 2018-01-20 (×12): 100 mg via ORAL
  Filled 2018-01-14 (×12): qty 4

## 2018-01-14 MED ORDER — VITAMIN B-1 100 MG PO TABS: 100.0000 mg | ORAL_TABLET | Freq: Every day | ORAL | Status: DC

## 2018-01-14 MED ORDER — ONDANSETRON HCL 4 MG/2ML IJ SOLN
4.0000 mg | Freq: Once | INTRAMUSCULAR | Status: AC
  Administered 2018-01-14: 4 mg via INTRAVENOUS
  Filled 2018-01-14: qty 2

## 2018-01-14 MED ORDER — ALBUTEROL SULFATE (2.5 MG/3ML) 0.083% IN NEBU: 2.5000 mg | INHALATION_SOLUTION | RESPIRATORY_TRACT | Status: DC

## 2018-01-14 MED ORDER — SODIUM CHLORIDE 0.9 % IV BOLUS
1000.0000 mL | Freq: Once | INTRAVENOUS | Status: AC
  Administered 2018-01-14: 1000 mL via INTRAVENOUS

## 2018-01-14 MED ORDER — LORAZEPAM 2 MG/ML IJ SOLN: 1.0000 mg | Freq: Four times a day (QID) | INTRAMUSCULAR | Status: AC | PRN

## 2018-01-14 MED ORDER — LORAZEPAM 2 MG/ML IJ SOLN
2.0000 mg | Freq: Once | INTRAMUSCULAR | Status: AC
  Administered 2018-01-14: 2 mg via INTRAVENOUS
  Filled 2018-01-14: qty 1

## 2018-01-14 MED ORDER — ALBUTEROL SULFATE HFA 108 (90 BASE) MCG/ACT IN AERS
2.0000 | INHALATION_SPRAY | RESPIRATORY_TRACT | Status: DC | PRN
  Administered 2018-01-15 (×2): 2 via RESPIRATORY_TRACT

## 2018-01-14 MED ORDER — SODIUM CHLORIDE 0.9 % IV SOLN
INTRAVENOUS | Status: AC
  Administered 2018-01-14 – 2018-01-15 (×3): via INTRAVENOUS

## 2018-01-14 MED ORDER — ALBUTEROL SULFATE (2.5 MG/3ML) 0.083% IN NEBU: 2.5000 mg | INHALATION_SOLUTION | RESPIRATORY_TRACT | Status: DC | PRN

## 2018-01-14 MED ORDER — THIAMINE HCL 100 MG/ML IJ SOLN
100.0000 mg | Freq: Every day | INTRAMUSCULAR | Status: DC
  Administered 2018-01-14: 100 mg via INTRAVENOUS
  Filled 2018-01-14: qty 2

## 2018-01-14 MED ORDER — THIAMINE HCL 100 MG/ML IJ SOLN
100.0000 mg | Freq: Every day | INTRAMUSCULAR | Status: DC
  Filled 2018-01-14 (×2): qty 2

## 2018-01-14 MED ORDER — ACETAMINOPHEN 650 MG RE SUPP: 650.0000 mg | Freq: Four times a day (QID) | RECTAL | Status: DC | PRN

## 2018-01-14 MED ORDER — CITALOPRAM HYDROBROMIDE 20 MG PO TABS
20.0000 mg | ORAL_TABLET | Freq: Every day | ORAL | Status: DC
  Administered 2018-01-15 – 2018-01-20 (×6): 20 mg via ORAL
  Filled 2018-01-14 (×6): qty 1

## 2018-01-14 MED ORDER — LORAZEPAM 1 MG PO TABS: 0.0000 mg | ORAL_TABLET | Freq: Four times a day (QID) | ORAL | Status: DC

## 2018-01-14 MED ORDER — PREDNISONE 20 MG PO TABS
60.0000 mg | ORAL_TABLET | Freq: Once | ORAL | Status: DC
  Administered 2018-01-15: 60 mg via ORAL

## 2018-01-14 MED ORDER — ADULT MULTIVITAMIN W/MINERALS CH
1.0000 | ORAL_TABLET | Freq: Every day | ORAL | Status: DC
  Administered 2018-01-14 – 2018-01-21 (×8): 1 via ORAL
  Filled 2018-01-14 (×8): qty 1

## 2018-01-14 MED ORDER — PROMETHAZINE HCL 25 MG PO TABS
12.5000 mg | ORAL_TABLET | Freq: Once | ORAL | Status: AC
  Administered 2018-01-14: 12.5 mg via ORAL
  Filled 2018-01-14: qty 1

## 2018-01-14 MED ORDER — IPRATROPIUM BROMIDE 0.02 % IN SOLN: 0.5000 mg | RESPIRATORY_TRACT | Status: DC

## 2018-01-14 MED ORDER — IPRATROPIUM-ALBUTEROL 20-100 MCG/ACT IN AERS: 1.0000 | INHALATION_SPRAY | Freq: Four times a day (QID) | RESPIRATORY_TRACT | Status: DC | PRN

## 2018-01-14 MED ORDER — ONDANSETRON HCL 4 MG PO TABS
4.0000 mg | ORAL_TABLET | Freq: Four times a day (QID) | ORAL | Status: DC | PRN
  Administered 2018-01-15 – 2018-01-16 (×2): 4 mg via ORAL
  Filled 2018-01-14 (×2): qty 1

## 2018-01-14 MED ORDER — LORAZEPAM 1 MG PO TABS
0.0000 mg | ORAL_TABLET | Freq: Two times a day (BID) | ORAL | Status: AC
  Administered 2018-01-16: 2 mg via ORAL
  Administered 2018-01-17: 1 mg via ORAL
  Administered 2018-01-17: 3 mg via ORAL
  Administered 2018-01-18: 2 mg via ORAL
  Filled 2018-01-14: qty 1
  Filled 2018-01-14 (×3): qty 2

## 2018-01-14 MED ORDER — LORAZEPAM 1 MG PO TABS
1.0000 mg | ORAL_TABLET | Freq: Four times a day (QID) | ORAL | Status: AC | PRN
  Administered 2018-01-15 – 2018-01-17 (×2): 1 mg via ORAL
  Filled 2018-01-14: qty 2
  Filled 2018-01-14: qty 1

## 2018-01-14 MED ORDER — IPRATROPIUM-ALBUTEROL 0.5-2.5 (3) MG/3ML IN SOLN
3.0000 mL | Freq: Four times a day (QID) | RESPIRATORY_TRACT | Status: DC
  Administered 2018-01-15: 3 mL via RESPIRATORY_TRACT
  Filled 2018-01-14: qty 3

## 2018-01-14 NOTE — ED Notes (Signed)
Bed: WA31 Expected date:  Expected time:  Means of arrival:  Comments: 
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
## 2018-01-14 NOTE — ED Notes (Signed)
Pt expresses suicidal ideation with no significant plan at this time. Pt states she is an alcoholic and she last drank around 0400 7/18. Has a history of cocaine, but not in the last week. Pt states she just wants help.

## 2018-01-14 NOTE — ED Notes (Signed)
Bed: [facility] Expected date:  Expected time:  Means of arrival:  Comments: 

## 2018-01-14 NOTE — ED Notes (Signed)
Pt. has not urinate yet again to collect it maybe will collect it on the floor; room 1404
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
## 2018-01-14 NOTE — ED Provider Notes (Signed)
Patient care was signed out by Harlene SaltsBrandon Morelli, PA-C at shift change.  Per his HPI,   "Charlotte DuttonMelanie Catapano is a 52 y.o. female presenting for alcohol detox.  Patient states that she is an alcoholic who drinks until she blacks out daily.  Patient states that her last drink was approximately 4 AM this morning.  Patient states that she also has a history of using cocaine but states that she has not used any this week.  Patient states that she is just here for help with detoxing from alcohol. Patient also endorses shortness of breath, she states that she has a history of asthma and COPD.  Patient states that her shortness of breath began this morning when she woke up, she states that she feels that she is breathing somewhat better now.   Patient endorses nausea at this time however denies vomiting.  Patient denies tremors or homicidal/suicidal ideations. Patient denies chest pain, abdominal pain, headache."  After workup, pt also endorsed suicidal ideations. TTS was consulted and labs were ordered.  Plan for patient is to f/u on UDS and CXR.  Pt likely safe for d/c with ativan and outpatient resources for detox if she is cleared by TTS and if her CIWA improves.   On my evaluation pt is c/o some shortness of breath and a mild cough. She states she ran out of her inhalers for her COPD and asthma. No significant sputum production. Pt is a smoker. No BLE swelling. Do not suspect PE. She has some expiratory wheezing on exam. Sats have been around 93% and she is breathing on room air without any tachypnea. She is speaking in full sentences in no acute distress. Will give duo neb. She likely needs to restart her normal COPD medications to attain symptomatic control. Will give dose of prednisone. CXR does not show any evidence of a pneumonia.   Patient's initial CIWA score was 20.   6:00 PM CIWA was 18 after Ativan was given. CIWA not improving significantly.   Will admit for ETOH withdrawal given her persistently high  CIWA scores. Discussed case with Dr. Silverio LayYao who agrees with the workup and plan.  8:06 PM CONSULT with Dr. Toniann FailKakrakandy, hospitalist service who will admit the patient for observation for ETOH withdrawal.    Karrie MeresCouture, Council Munguia S, PA-C 01/14/18 2312    Charlynne PanderYao, David Hsienta, MD 01/14/18 2322

## 2018-01-14 NOTE — ED Triage Notes (Signed)
[facility]. Pt complains of SOB, chronic alcoholic has not drank today. Feels sick to stomach. Lungs clear bilateral.
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
## 2018-01-14 NOTE — ED Notes (Signed)
Bed: WA27 Expected date:  Expected time:  Means of arrival:  Comments: 

## 2018-01-15 DIAGNOSIS — F3131 Bipolar disorder, current episode depressed, mild: Secondary | ICD-10-CM | POA: Diagnosis not present

## 2018-01-15 DIAGNOSIS — F3132 Bipolar disorder, current episode depressed, moderate: Secondary | ICD-10-CM | POA: Diagnosis not present

## 2018-01-15 DIAGNOSIS — Z915 Personal history of self-harm: Secondary | ICD-10-CM | POA: Diagnosis not present

## 2018-01-15 DIAGNOSIS — B1920 Unspecified viral hepatitis C without hepatic coma: Secondary | ICD-10-CM | POA: Diagnosis not present

## 2018-01-15 DIAGNOSIS — F10239 Alcohol dependence with withdrawal, unspecified: Secondary | ICD-10-CM | POA: Diagnosis not present

## 2018-01-15 DIAGNOSIS — F1994 Other psychoactive substance use, unspecified with psychoactive substance-induced mood disorder: Secondary | ICD-10-CM | POA: Diagnosis not present

## 2018-01-15 DIAGNOSIS — Z818 Family history of other mental and behavioral disorders: Secondary | ICD-10-CM

## 2018-01-15 DIAGNOSIS — Z590 Homelessness: Secondary | ICD-10-CM | POA: Diagnosis not present

## 2018-01-15 DIAGNOSIS — F4310 Post-traumatic stress disorder, unspecified: Secondary | ICD-10-CM | POA: Diagnosis present

## 2018-01-15 DIAGNOSIS — F17210 Nicotine dependence, cigarettes, uncomplicated: Secondary | ICD-10-CM | POA: Diagnosis present

## 2018-01-15 DIAGNOSIS — F1123 Opioid dependence with withdrawal: Secondary | ICD-10-CM | POA: Diagnosis present

## 2018-01-15 DIAGNOSIS — G4700 Insomnia, unspecified: Secondary | ICD-10-CM

## 2018-01-15 DIAGNOSIS — F319 Bipolar disorder, unspecified: Secondary | ICD-10-CM | POA: Diagnosis present

## 2018-01-15 DIAGNOSIS — F10229 Alcohol dependence with intoxication, unspecified: Secondary | ICD-10-CM | POA: Diagnosis present

## 2018-01-15 DIAGNOSIS — F1024 Alcohol dependence with alcohol-induced mood disorder: Secondary | ICD-10-CM | POA: Diagnosis not present

## 2018-01-15 DIAGNOSIS — F410 Panic disorder [episodic paroxysmal anxiety] without agoraphobia: Secondary | ICD-10-CM | POA: Diagnosis present

## 2018-01-15 DIAGNOSIS — F1410 Cocaine abuse, uncomplicated: Secondary | ICD-10-CM | POA: Diagnosis present

## 2018-01-15 DIAGNOSIS — Z91410 Personal history of adult physical and sexual abuse: Secondary | ICD-10-CM | POA: Diagnosis not present

## 2018-01-15 DIAGNOSIS — F1414 Cocaine abuse with cocaine-induced mood disorder: Secondary | ICD-10-CM | POA: Diagnosis not present

## 2018-01-15 DIAGNOSIS — F10230 Alcohol dependence with withdrawal, uncomplicated: Secondary | ICD-10-CM | POA: Diagnosis present

## 2018-01-15 DIAGNOSIS — F1910 Other psychoactive substance abuse, uncomplicated: Secondary | ICD-10-CM | POA: Diagnosis not present

## 2018-01-15 DIAGNOSIS — F419 Anxiety disorder, unspecified: Secondary | ICD-10-CM | POA: Diagnosis not present

## 2018-01-15 DIAGNOSIS — F1124 Opioid dependence with opioid-induced mood disorder: Secondary | ICD-10-CM | POA: Diagnosis not present

## 2018-01-15 DIAGNOSIS — R45851 Suicidal ideations: Secondary | ICD-10-CM

## 2018-01-15 DIAGNOSIS — Z79899 Other long term (current) drug therapy: Secondary | ICD-10-CM | POA: Diagnosis not present

## 2018-01-15 DIAGNOSIS — N189 Chronic kidney disease, unspecified: Secondary | ICD-10-CM | POA: Diagnosis not present

## 2018-01-15 DIAGNOSIS — Z91411 Personal history of adult psychological abuse: Secondary | ICD-10-CM | POA: Diagnosis not present

## 2018-01-15 DIAGNOSIS — E876 Hypokalemia: Secondary | ICD-10-CM | POA: Diagnosis present

## 2018-01-15 DIAGNOSIS — K219 Gastro-esophageal reflux disease without esophagitis: Secondary | ICD-10-CM | POA: Diagnosis not present

## 2018-01-15 DIAGNOSIS — G629 Polyneuropathy, unspecified: Secondary | ICD-10-CM | POA: Diagnosis present

## 2018-01-15 DIAGNOSIS — G8929 Other chronic pain: Secondary | ICD-10-CM | POA: Diagnosis present

## 2018-01-15 DIAGNOSIS — J449 Chronic obstructive pulmonary disease, unspecified: Secondary | ICD-10-CM | POA: Diagnosis present

## 2018-01-15 DIAGNOSIS — F064 Anxiety disorder due to known physiological condition: Secondary | ICD-10-CM | POA: Diagnosis present

## 2018-01-15 LAB — HEPATIC FUNCTION PANEL
ALK PHOS: 80 U/L (ref 38–126)
ALT: 37 U/L (ref 0–44)
AST: 35 U/L (ref 15–41)
Albumin: 2.8 g/dL — ABNORMAL LOW (ref 3.5–5.0)
BILIRUBIN INDIRECT: 0.7 mg/dL (ref 0.3–0.9)
BILIRUBIN TOTAL: 0.9 mg/dL (ref 0.3–1.2)
Bilirubin, Direct: 0.2 mg/dL (ref 0.0–0.2)
TOTAL PROTEIN: 6.1 g/dL — AB (ref 6.5–8.1)

## 2018-01-15 LAB — BASIC METABOLIC PANEL
Anion gap: 6 (ref 5–15)
BUN: 5 mg/dL — AB (ref 6–20)
CALCIUM: 7.9 mg/dL — AB (ref 8.9–10.3)
CHLORIDE: 111 mmol/L (ref 98–111)
CO2: 27 mmol/L (ref 22–32)
CREATININE: 0.96 mg/dL (ref 0.44–1.00)
GFR calc non Af Amer: >60 mL/min (ref >60)
Glucose, Bld: 90 mg/dL (ref 70–99)
Potassium: 3 mmol/L — ABNORMAL LOW (ref 3.5–5.1)
SODIUM: 144 mmol/L (ref 135–145)

## 2018-01-15 LAB — CBC WITH DIFFERENTIAL/PLATELET
BASOS ABS: 0 10*3/uL (ref 0.0–0.1)
Basophils Relative: 1 %
EOS PCT: 4 %
Eosinophils Absolute: 0.2 10*3/uL (ref 0.0–0.7)
HCT: 38.1 % (ref 36.0–46.0)
HEMOGLOBIN: 12.2 g/dL (ref 12.0–15.0)
Lymphocytes Relative: 38 %
Lymphs Abs: 2.1 10*3/uL (ref 0.7–4.0)
MCH: 30.4 pg (ref 26.0–34.0)
MCHC: 32 g/dL (ref 30.0–36.0)
MCV: 95 fL (ref 78.0–100.0)
Monocytes Absolute: 0.6 10*3/uL (ref 0.1–1.0)
Monocytes Relative: 10 %
NEUTROS PCT: 47 %
Neutro Abs: 2.6 10*3/uL (ref 1.7–7.7)
PLATELETS: 197 10*3/uL (ref 150–400)
RBC: 4.01 MIL/uL (ref 3.87–5.11)
RDW: 14.5 % (ref 11.5–15.5)
WBC: 5.4 10*3/uL (ref 4.0–10.5)

## 2018-01-15 LAB — TSH: TSH: 2.022 u[IU]/mL (ref 0.350–4.500)

## 2018-01-15 LAB — HIV ANTIBODY (ROUTINE TESTING W REFLEX): HIV Screen 4th Generation wRfx: NONREACTIVE

## 2018-01-15 LAB — MAGNESIUM: MAGNESIUM: 1.6 mg/dL — AB (ref 1.7–2.4)

## 2018-01-15 MED ORDER — POTASSIUM CHLORIDE CRYS ER 10 MEQ PO TBCR
10.0000 meq | EXTENDED_RELEASE_TABLET | Freq: Every day | ORAL | Status: DC
  Administered 2018-01-16: 10 meq via ORAL
  Filled 2018-01-15: qty 1

## 2018-01-15 MED ORDER — IPRATROPIUM-ALBUTEROL 0.5-2.5 (3) MG/3ML IN SOLN
3.0000 mL | RESPIRATORY_TRACT | Status: DC | PRN
  Administered 2018-01-16 – 2018-01-17 (×4): 3 mL via RESPIRATORY_TRACT
  Filled 2018-01-15 (×4): qty 3

## 2018-01-15 MED ORDER — ENOXAPARIN SODIUM 40 MG/0.4ML ~~LOC~~ SOLN
40.0000 mg | SUBCUTANEOUS | Status: DC
  Administered 2018-01-15 – 2018-01-21 (×7): 40 mg via SUBCUTANEOUS
  Filled 2018-01-15 (×7): qty 0.4

## 2018-01-15 MED ORDER — ALBUTEROL SULFATE (2.5 MG/3ML) 0.083% IN NEBU: 2.5000 mg | INHALATION_SOLUTION | RESPIRATORY_TRACT | Status: DC | PRN

## 2018-01-15 MED ORDER — POTASSIUM CHLORIDE CRYS ER 20 MEQ PO TBCR
40.0000 meq | EXTENDED_RELEASE_TABLET | ORAL | Status: AC
  Administered 2018-01-15 (×3): 40 meq via ORAL
  Filled 2018-01-15 (×3): qty 2

## 2018-01-15 MED ORDER — MAGNESIUM SULFATE 2 GM/50ML IV SOLN
2.0000 g | Freq: Once | INTRAVENOUS | Status: AC
  Administered 2018-01-15: 2 g via INTRAVENOUS
  Filled 2018-01-15: qty 50

## 2018-01-15 MED ORDER — IPRATROPIUM-ALBUTEROL 0.5-2.5 (3) MG/3ML IN SOLN
3.0000 mL | Freq: Three times a day (TID) | RESPIRATORY_TRACT | Status: DC
  Filled 2018-01-15: qty 3

## 2018-01-15 NOTE — Care Management Note (Signed)
Case Management Note  Patient Details  Name: Charlotte DuttonMelanie Fisher MRN: [id number] Date of Birth: 09/03/1965  Subjective/Objective:  Spoke to patient in rm-she says she has place to stay-they drink but she is not fearful or feels she is unsafe there. She hopes to go to rehab or a halfway house. Has an established pcp, & a pharmacy for meds.No CM needs.                  Action/Plan:d/c home.   Expected Discharge Date:  (unknown)               Expected Discharge Plan:  Home/Self Care  In-House Referral:  Clinical Social Work  Discharge planning Services  [facility]  Post Acute Care Choice:    Choice offered to:     DME Arranged:    DME Agency:     HH Arranged:    HH Agency:     Status of Service:  In process, will continue to follow  If discussed at Long Length of Stay Meetings, dates discussed:    Additional Comments:  Lanier ClamMahabir, Magdaleno Lortie, RN 01/15/2018, 11:15 AM
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
## 2018-01-15 NOTE — Progress Notes (Signed)
Patient ID: Charlotte Fisher, female   DOB: 04/24/1966, 52 y.o.   MRN: [id number]  PROGRESS NOTE    Myesha Stillion  JXB:[id number] DOB: [birth year] DOA: 01/14/2018 PCP: Patient, No Pcp Per    Brief Narrative:  Charlotte Fisher is a 52 y.o. female with history of alcoholism, COPD, neuropathy presents to the ER with wanting to have alcohol detox.  Patient also has some suicidal thoughts.  Patient states she has been drinking alcohol for long time and wanted to detox.  Denies any chest pain shortness of breath headache nausea vomiting or diarrhea.  Has not attempted any suicide but was having thoughts.   Assessment & Plan:   Principal Problem:   Alcohol withdrawal (HCC) Active Problems:   Polysubstance abuse (HCC)   Alcohol dependence with uncomplicated withdrawal (HCC)   #1 alcohol withdrawal: Patient on CIWA protocol.  Slightly agitated but responded.  Psychiatric consult pending.  Continue thiamine and folic acid  #2 suicidal ideation: Patient is likely going to need inpatient psychiatric care after medical clearance.  Continue sitter in the room as well as suicide precaution.  #3 severe hypokalemia: Most likely secondary to alcoholism.  Magnesium checked this morning and is 1.6.  Replete both potassium and magnesium  #4 hypomagnesemia: Replete magnesium.  #5 COPD: Continue empiric nebulizer and inhalers.  #6 history of neuropathy: Continue with gabapentin.   DVT prophylaxis: Lovenox Code Status: Full Code Family Communication: Discussed with Patient Disposition Plan: Inpatient psych Consultants:   Psychiatry  Procedures: None  Antimicrobials: None   Subjective: Patient is stable.  She remains calm.  Still very suicidal  Objective: Vitals:   01/14/18 2209 01/14/18 2250 01/14/18 2253 01/15/18 0452  BP: (!) 140/103  (!) 160/106 (!) 150/98  Pulse: 98  99 (!) 101  Resp:   18 20  Temp:   (!) 97.5 F (36.4 C) 98.1 F (36.7 C)  TempSrc:   Oral Oral  SpO2:   (!) 72% 94%  Weight:   87.2 kg (192 lb 3.9 oz)    Height:  5\' 2"  (1.575 m)      Intake/Output Summary (Last 24 hours) at 01/15/2018 0723 Last data filed at 01/15/2018 0600 Gross per 24 hour  Intake 895.83 ml  Output 400 ml  Net 495.83 ml   Filed Weights   01/14/18 1153 01/14/18 2250  Weight: 87.5 kg (193 lb) 87.2 kg (192 lb 3.9 oz)    Examination:  General exam: Appears calm and comfortable  Respiratory system: Clear to auscultation. Respiratory effort normal. Cardiovascular system: S1 & S2 heard, RRR. No JVD, murmurs, rubs, gallops or clicks. No pedal edema. Gastrointestinal system: Abdomen is nondistended, soft and nontender. No organomegaly or masses felt. Normal bowel sounds heard. Central nervous system: Alert and oriented. No focal neurological deficits. Extremities: Symmetric 5 x 5 power. Skin: No rashes, lesions or ulcers Psychiatry: Depressed, Suicidal  Data Reviewed: I have personally reviewed following labs and imaging studies  CBC: Recent Labs  Lab 01/14/18 1217 01/15/18 0530  WBC 7.3 5.4  NEUTROABS  --  2.6  HGB 14.0 12.2  HCT 41.8 38.1  MCV 92.9 95.0  PLT 246 197   Basic Metabolic Panel: Recent Labs  Lab 01/14/18 1217 01/15/18 0530  NA 146* 144  K 3.5 3.0*  CL 113* 111  CO2 23 27  GLUCOSE 117* 90  BUN 6 5*  CREATININE 0.87 0.96  CALCIUM 8.7* 7.9*   GFR: Estimated Creatinine Clearance: 70.2 mL/min (by C-G formula based on SCr of 0.96 mg/dL).  Liver Function Tests: Recent Labs  Lab 01/14/18 1217 01/15/18 0530  AST 51* 35  ALT 50* 37  ALKPHOS 95 80  BILITOT 0.9 0.9  PROT 7.5 6.1*  ALBUMIN 3.4* 2.8*   No results for input(s): LIPASE, AMYLASE in the last 168 hours. No results for input(s): AMMONIA in the last 168 hours. Coagulation Profile: No results for input(s): INR, PROTIME in the last 168 hours. Cardiac Enzymes: No results for input(s): CKTOTAL, CKMB, CKMBINDEX, TROPONINI in the last 168 hours. BNP (last 3 results) No results for input(s): PROBNP in the  last 8760 hours. HbA1C: No results for input(s): HGBA1C in the last 72 hours. CBG: No results for input(s): GLUCAP in the last 168 hours. Lipid Profile: No results for input(s): CHOL, HDL, LDLCALC, TRIG, CHOLHDL, LDLDIRECT in the last 72 hours. Thyroid Function Tests: No results for input(s): TSH, T4TOTAL, FREET4, T3FREE, THYROIDAB in the last 72 hours. Anemia Panel: No results for input(s): VITAMINB12, FOLATE, FERRITIN, TIBC, IRON, RETICCTPCT in the last 72 hours. Urine analysis:    Component Value Date/Time   COLORURINE YELLOW 05/29/2017 1509   APPEARANCEUR CLEAR 05/29/2017 1509   APPEARANCEUR Clear 10/01/2014 1742   LABSPEC 1.034 (H) 05/29/2017 1509   LABSPEC 1.018 10/01/2014 1742   PHURINE 6.0 05/29/2017 1509   GLUCOSEU NEGATIVE 05/29/2017 1509   GLUCOSEU Negative 10/01/2014 1742   HGBUR NEGATIVE 05/29/2017 1509   BILIRUBINUR NEGATIVE 05/29/2017 1509   BILIRUBINUR Negative 10/01/2014 1742   KETONESUR 5 (A) 05/29/2017 1509   PROTEINUR NEGATIVE 05/29/2017 1509   UROBILINOGEN 0.2 03/17/2015 2234   NITRITE NEGATIVE 05/29/2017 1509   LEUKOCYTESUR NEGATIVE 05/29/2017 1509   LEUKOCYTESUR Negative 10/01/2014 1742   Sepsis Labs: @LABRCNTIP (procalcitonin:4,lacticidven:4)  )No results found for this or any previous visit (from the past 240 hour(s)).       Radiology Studies: Dg Chest 2 View  Result Date: 01/14/2018 CLINICAL DATA:  Shortness of Breath EXAM: CHEST - 2 VIEW COMPARISON:  01/14/2018 FINDINGS: Mild peribronchial thickening. Heart and mediastinal contours are within normal limits. No focal opacities or effusions. No acute bony abnormality. IMPRESSION: Mild bronchitic changes. Electronically Signed   By: Charlett NoseKevin  Dover M.D.   On: 01/14/2018 18:12        Scheduled Meds: . citalopram  20 mg Oral Daily  . folic acid  1 mg Oral Daily  . gabapentin  800 mg Oral TID  . ipratropium-albuterol  3 mL Nebulization Once  . ipratropium-albuterol  3 mL Nebulization QID  .  LORazepam  0-4 mg Oral Q6H   Followed by  . [START ON 01/16/2018] LORazepam  0-4 mg Oral Q12H  . multivitamin with minerals  1 tablet Oral Daily  . potassium chloride  40 mEq Oral Q4H  . thiamine  100 mg Oral Daily   Or  . thiamine  100 mg Intravenous Daily  . traZODone  100 mg Oral QHS   Continuous Infusions: . sodium chloride 125 mL/hr at 01/15/18 0455     LOS: 0 days    Time spent: 33 minutes    GARBA,LAWAL, MD [facility] Pager [phone]  If 7PM-7AM, please contact night-coverage [email or web address] Password [email or web address] 01/15/2018, 7:23 AM

## 2018-01-16 LAB — CBC WITH DIFFERENTIAL/PLATELET
BASOS ABS: 0 10*3/uL (ref 0.0–0.1)
BASOS PCT: 0 %
EOS PCT: 5 %
Eosinophils Absolute: 0.3 10*3/uL (ref 0.0–0.7)
HCT: 39 % (ref 36.0–46.0)
Hemoglobin: 12.5 g/dL (ref 12.0–15.0)
Lymphocytes Relative: 37 %
Lymphs Abs: 2.2 10*3/uL (ref 0.7–4.0)
MCH: 30.6 pg (ref 26.0–34.0)
MCHC: 32.1 g/dL (ref 30.0–36.0)
MCV: 95.4 fL (ref 78.0–100.0)
Monocytes Absolute: 0.5 10*3/uL (ref 0.1–1.0)
Monocytes Relative: 8 %
Neutro Abs: 3 10*3/uL (ref 1.7–7.7)
Neutrophils Relative %: 50 %
PLATELETS: 192 10*3/uL (ref 150–400)
RBC: 4.09 MIL/uL (ref 3.87–5.11)
RDW: 14.7 % (ref 11.5–15.5)
WBC: 6 10*3/uL (ref 4.0–10.5)

## 2018-01-16 LAB — COMPREHENSIVE METABOLIC PANEL
ALT: 36 U/L (ref 0–44)
AST: 36 U/L (ref 15–41)
Albumin: 3.2 g/dL — ABNORMAL LOW (ref 3.5–5.0)
Alkaline Phosphatase: 86 U/L (ref 38–126)
Anion gap: 6 (ref 5–15)
BILIRUBIN TOTAL: 0.6 mg/dL (ref 0.3–1.2)
BUN: 5 mg/dL — AB (ref 6–20)
CO2: 23 mmol/L (ref 22–32)
Calcium: 8.3 mg/dL — ABNORMAL LOW (ref 8.9–10.3)
Chloride: 114 mmol/L — ABNORMAL HIGH (ref 98–111)
Creatinine, Ser: 1.05 mg/dL — ABNORMAL HIGH (ref 0.44–1.00)
GFR, EST NON AFRICAN AMERICAN: 60 mL/min — AB (ref >60)
Glucose, Bld: 116 mg/dL — ABNORMAL HIGH (ref 70–99)
Potassium: 4 mmol/L (ref 3.5–5.1)
Sodium: 143 mmol/L (ref 135–145)
TOTAL PROTEIN: 6.7 g/dL (ref 6.5–8.1)

## 2018-01-16 LAB — MAGNESIUM: MAGNESIUM: 2.1 mg/dL (ref 1.7–2.4)

## 2018-01-16 MED ORDER — IBUPROFEN 800 MG PO TABS
800.0000 mg | ORAL_TABLET | Freq: Four times a day (QID) | ORAL | Status: DC | PRN
  Administered 2018-01-16 – 2018-01-19 (×6): 800 mg via ORAL
  Filled 2018-01-16 (×8): qty 1

## 2018-01-16 MED ORDER — GI COCKTAIL ~~LOC~~
30.0000 mL | Freq: Three times a day (TID) | ORAL | Status: DC | PRN
  Administered 2018-01-17 (×4): 30 mL via ORAL
  Filled 2018-01-16 (×5): qty 30

## 2018-01-16 NOTE — Progress Notes (Signed)
PROGRESS NOTE    Charlotte DuttonMelanie Fisher  WUJ:[id number] DOB: 06/27/1966 DOA: 01/14/2018 PCP: Patient, No Pcp Per   Brief Narrative:  52 year old with past medical history relevant for polysubstance abuse (alcohol, opiates, cocaine), bipolar disorder, COPD coming in with suicidal ideation and request to detox from alcohol.   Assessment & Plan:   Principal Problem:   Substance induced mood disorder (HCC) Active Problems:   Polysubstance abuse (HCC)   Alcohol dependence with uncomplicated withdrawal (HCC)   Alcohol withdrawal (HCC)   #) Polysubstance abuse including alcohol: She reports her last drink was approximately 3 days ago.  She does not have much in the way by vital signs or exam of withdrawal at this point.  She reports a complicated history of withdrawals but no seizures. -Continue CIWA protocol -Continue with thiamine and folate supplementation -Continue gabapentin 800 mg 3 times daily  #) Suicidal ideation: Patient continues to endorse suicidal ideation mostly due to stress over her boyfriend. -Psychiatry consulted, recommended inpatient evaluation - Pending placement  #) Bipolar disorder: -Pending EKG -Continue trazodone 100 mg nightly - Continue citalopram 20 mg daily  #) Pain: - Continue gabapentin  #) COPD: -Continue PRN bronchodilators  Fluids: Tolerating p.o. Electrodes: Monitor and supplement Nutrition: Regular diet  Prophylaxis: Enoxaparin  Disposition: Pending inpatient psych evaluation  Full code   Consultants:   Psychiatry  Procedures:   None  Antimicrobials:   None   Subjective: Patient reports that she was doing well but continues to endorse shakiness.  She reports that she continues to have suicidal thoughts and is reported a history of suicidal behaviors before.  She denies any manic symptoms and no hallucinations or delusions.  Objective: Vitals:   01/15/18 0836 01/15/18 1152 01/15/18 2235 01/16/18 0704  BP:  (!) 140/95 130/90  139/82  Pulse:  89 93 85  Resp:  16 20 18   Temp:  98.7 F (37.1 C) 97.7 F (36.5 C) 98.4 F (36.9 C)  TempSrc:   Oral Oral  SpO2: 97% 96% 97% 98%  Weight:      Height:        Intake/Output Summary (Last 24 hours) at 01/16/2018 1220 Last data filed at 01/16/2018 1210 Gross per 24 hour  Intake 2250 ml  Output 1 ml  Net 2249 ml   Filed Weights   01/14/18 1153 01/14/18 2250  Weight: 87.5 kg (193 lb) 87.2 kg (192 lb 3.9 oz)    Examination:  General exam: No acute distress Respiratory system: Clear to auscultation. Respiratory effort normal. Cardiovascular system: Regular rate and rhythm, no murmurs Gastrointestinal system: Abdomen is nondistended, soft and nontender. No organomegaly or masses felt. Normal bowel sounds heard. Central nervous system: Alert and oriented. No focal neurological deficits. Extremities: No lower extremity edema Skin: No rashes on visible skin Psychiatry: Judgement and insight appear normal. Mood & affect appropriate.  Poor endorses SI but no HI    Data Reviewed: I have personally reviewed following labs and imaging studies  CBC: Recent Labs  Lab 01/14/18 1217 01/15/18 0530 01/16/18 0818  WBC 7.3 5.4 6.0  NEUTROABS  --  2.6 3.0  HGB 14.0 12.2 12.5  HCT 41.8 38.1 39.0  MCV 92.9 95.0 95.4  PLT 246 197 192   Basic Metabolic Panel: Recent Labs  Lab 01/14/18 1217 01/15/18 0530 01/16/18 0818  NA 146* 144 143  K 3.5 3.0* 4.0  CL 113* 111 114*  CO2 23 27 23   GLUCOSE 117* 90 116*  BUN 6 5* 5*  CREATININE 0.87  0.96 1.05*  CALCIUM 8.7* 7.9* 8.3*  MG  --  1.6* 2.1   GFR: Estimated Creatinine Clearance: 64.2 mL/min (A) (by C-G formula based on SCr of 1.05 mg/dL (H)). Liver Function Tests: Recent Labs  Lab 01/14/18 1217 01/15/18 0530 01/16/18 0818  AST 51* 35 36  ALT 50* 37 36  ALKPHOS 95 80 86  BILITOT 0.9 0.9 0.6  PROT 7.5 6.1* 6.7  ALBUMIN 3.4* 2.8* 3.2*   No results for input(s): LIPASE, AMYLASE in the last 168 hours. No  results for input(s): AMMONIA in the last 168 hours. Coagulation Profile: No results for input(s): INR, PROTIME in the last 168 hours. Cardiac Enzymes: No results for input(s): CKTOTAL, CKMB, CKMBINDEX, TROPONINI in the last 168 hours. BNP (last 3 results) No results for input(s): PROBNP in the last 8760 hours. HbA1C: No results for input(s): HGBA1C in the last 72 hours. CBG: No results for input(s): GLUCAP in the last 168 hours. Lipid Profile: No results for input(s): CHOL, HDL, LDLCALC, TRIG, CHOLHDL, LDLDIRECT in the last 72 hours. Thyroid Function Tests: Recent Labs    01/15/18 0530  TSH 2.022   Anemia Panel: No results for input(s): VITAMINB12, FOLATE, FERRITIN, TIBC, IRON, RETICCTPCT in the last 72 hours. Sepsis Labs: No results for input(s): PROCALCITON, LATICACIDVEN in the last 168 hours.  No results found for this or any previous visit (from the past 240 hour(s)).       Radiology Studies: Dg Chest 2 View  Result Date: 01/14/2018 CLINICAL DATA:  Shortness of Breath EXAM: CHEST - 2 VIEW COMPARISON:  01/14/2018 FINDINGS: Mild peribronchial thickening. Heart and mediastinal contours are within normal limits. No focal opacities or effusions. No acute bony abnormality. IMPRESSION: Mild bronchitic changes. Electronically Signed   By: Charlett Nose M.D.   On: 01/14/2018 18:12        Scheduled Meds: . citalopram  20 mg Oral Daily  . enoxaparin (LOVENOX) injection  40 mg Subcutaneous Q24H  . folic acid  1 mg Oral Daily  . gabapentin  800 mg Oral TID  . ipratropium-albuterol  3 mL Nebulization Once  . LORazepam  0-4 mg Oral Q6H   Followed by  . LORazepam  0-4 mg Oral Q12H  . multivitamin with minerals  1 tablet Oral Daily  . potassium chloride  10 mEq Oral Daily  . thiamine  100 mg Oral Daily   Or  . thiamine  100 mg Intravenous Daily  . traZODone  100 mg Oral QHS   Continuous Infusions:   LOS: 1 day    Time spent: 35    Delaine Lame, MD [facility]  If 7PM-7AM, please contact night-coverage [email or web address] Password [email or web address] 01/16/2018, 12:20 PM

## 2018-01-17 LAB — CBC
HCT: 37.2 % (ref 36.0–46.0)
Hemoglobin: 11.8 g/dL — ABNORMAL LOW (ref 12.0–15.0)
MCH: 30.7 pg (ref 26.0–34.0)
MCHC: 31.7 g/dL (ref 30.0–36.0)
MCV: 96.9 fL (ref 78.0–100.0)
Platelets: 170 10*3/uL (ref 150–400)
RBC: 3.84 MIL/uL — ABNORMAL LOW (ref 3.87–5.11)
RDW: 14.7 % (ref 11.5–15.5)
WBC: 8 10*3/uL (ref 4.0–10.5)

## 2018-01-17 LAB — BASIC METABOLIC PANEL WITH GFR
Chloride: 110 mmol/L (ref 98–111)
Creatinine, Ser: 0.95 mg/dL (ref 0.44–1.00)
GFR calc Af Amer: >60 mL/min (ref >60)
GFR calc non Af Amer: >60 mL/min (ref >60)
Potassium: 4.9 mmol/L (ref 3.5–5.1)

## 2018-01-17 LAB — MAGNESIUM: Magnesium: 2 mg/dL (ref 1.7–2.4)

## 2018-01-17 LAB — BASIC METABOLIC PANEL
Anion gap: 4 — ABNORMAL LOW (ref 5–15)
BUN: 8 mg/dL (ref 6–20)
CO2: 28 mmol/L (ref 22–32)
Calcium: 8.4 mg/dL — ABNORMAL LOW (ref 8.9–10.3)
Glucose, Bld: 101 mg/dL — ABNORMAL HIGH (ref 70–99)
Sodium: 142 mmol/L (ref 135–145)

## 2018-01-17 MED ORDER — PREGABALIN 75 MG PO CAPS
75.0000 mg | ORAL_CAPSULE | Freq: Two times a day (BID) | ORAL | Status: DC
  Administered 2018-01-17 – 2018-01-18 (×3): 75 mg via ORAL
  Filled 2018-01-17 (×3): qty 1

## 2018-01-17 MED ORDER — ALBUTEROL SULFATE HFA 108 (90 BASE) MCG/ACT IN AERS
1.0000 | INHALATION_SPRAY | RESPIRATORY_TRACT | Status: DC | PRN
  Filled 2018-01-17 (×2): qty 6.7

## 2018-01-17 NOTE — Progress Notes (Signed)
PROGRESS NOTE    Charlotte DuttonMelanie Fisher  WUJ:[id number] DOB: 12/23/1965 DOA: 01/14/2018 PCP: Patient, No Pcp Per   Brief Narrative:  52 year old with past medical history relevant for polysubstance abuse (alcohol, opiates, cocaine), bipolar disorder, COPD coming in with suicidal ideation and request to detox from alcohol.   Assessment & Plan:   Principal Problem:   Substance induced mood disorder (HCC) Active Problems:   Polysubstance abuse (HCC)   Alcohol dependence with uncomplicated withdrawal (HCC)   Alcohol withdrawal (HCC)   #) Polysubstance abuse including alcohol:  She does not have much in the way by vital signs or exam of withdrawal at this point.  She reports a complicated history of withdrawals but no seizures. -Continue CIWA protocol -Continue with thiamine and folate supplementation -Continue gabapentin 800 mg 3 times daily  #) Suicidal ideation: Patient continues to endorse suicidal ideation mostly due to stress over her boyfriend. -Psychiatry consulted, recommended inpatient evaluation - Pending placement  #) Bipolar disorder: - QTC on EKG on 01/16/2018 is approximately 4 ED -Continue trazodone 100 mg nightly - Continue citalopram 20 mg daily  #) Pain: - Continue gabapentin  #) COPD: -Continue PRN bronchodilators  Fluids: Tolerating p.o. Electrodes: Monitor and supplement Nutrition: Regular diet  Prophylaxis: Enoxaparin  Disposition: Pending inpatient psych evaluation  Full code   Consultants:   Psychiatry  Procedures:   None  Antimicrobials:   None   Subjective: Patient reports that she was doing well.  She continues to endorse suicidal ideation.  She denies any chest pain, cough, congestion, rhinorrhea.  She continues to be mildly tremulous subjectively though objectively she has no tremor.  Objective: Vitals:   01/16/18 2223 01/16/18 2354 01/17/18 0356 01/17/18 0413  BP: 120/77  (!) 151/100   Pulse: (!) 101  (!) 102   Resp: 18  20     Temp: 98.4 F (36.9 C)  97.9 F (36.6 C)   TempSrc: Oral  Oral   SpO2: 97% 96% 96% 95%  Weight:      Height:        Intake/Output Summary (Last 24 hours) at 01/17/2018 1035 Last data filed at 01/17/2018 0800 Gross per 24 hour  Intake 840 ml  Output 1 ml  Net 839 ml   Filed Weights   01/14/18 1153 01/14/18 2250  Weight: 87.5 kg (193 lb) 87.2 kg (192 lb 3.9 oz)    Examination:  General exam: No acute distress Respiratory system: Clear to auscultation. Respiratory effort normal. Cardiovascular system: Regular rate and rhythm, no murmurs Gastrointestinal system: Abdomen is nondistended, soft and nontender. No organomegaly or masses felt. Normal bowel sounds heard. Central nervous system: Alert and oriented. No focal neurological deficits. Extremities: No lower extremity edema Skin: No rashes on visible skin Psychiatry: Judgement and insight appear normal. Mood & affect appropriate.  Poor endorses SI but no HI    Data Reviewed: I have personally reviewed following labs and imaging studies  CBC: Recent Labs  Lab 01/14/18 1217 01/15/18 0530 01/16/18 0818 01/17/18 0528  WBC 7.3 5.4 6.0 8.0  NEUTROABS  --  2.6 3.0  --   HGB 14.0 12.2 12.5 11.8*  HCT 41.8 38.1 39.0 37.2  MCV 92.9 95.0 95.4 96.9  PLT 246 197 192 170   Basic Metabolic Panel: Recent Labs  Lab 01/14/18 1217 01/15/18 0530 01/16/18 0818 01/17/18 0528  NA 146* 144 143 142  K 3.5 3.0* 4.0 4.9  CL 113* 111 114* 110  CO2 23 27 23 28   GLUCOSE 117* 90  116* 101*  BUN 6 5* 5* 8  CREATININE 0.87 0.96 1.05* 0.95  CALCIUM 8.7* 7.9* 8.3* 8.4*  MG  --  1.6* 2.1 2.0   GFR: Estimated Creatinine Clearance: 71 mL/min (by C-G formula based on SCr of 0.95 mg/dL). Liver Function Tests: Recent Labs  Lab 01/14/18 1217 01/15/18 0530 01/16/18 0818  AST 51* 35 36  ALT 50* 37 36  ALKPHOS 95 80 86  BILITOT 0.9 0.9 0.6  PROT 7.5 6.1* 6.7  ALBUMIN 3.4* 2.8* 3.2*   No results for input(s): LIPASE, AMYLASE in the  last 168 hours. No results for input(s): AMMONIA in the last 168 hours. Coagulation Profile: No results for input(s): INR, PROTIME in the last 168 hours. Cardiac Enzymes: No results for input(s): CKTOTAL, CKMB, CKMBINDEX, TROPONINI in the last 168 hours. BNP (last 3 results) No results for input(s): PROBNP in the last 8760 hours. HbA1C: No results for input(s): HGBA1C in the last 72 hours. CBG: No results for input(s): GLUCAP in the last 168 hours. Lipid Profile: No results for input(s): CHOL, HDL, LDLCALC, TRIG, CHOLHDL, LDLDIRECT in the last 72 hours. Thyroid Function Tests: Recent Labs    01/15/18 0530  TSH 2.022   Anemia Panel: No results for input(s): VITAMINB12, FOLATE, FERRITIN, TIBC, IRON, RETICCTPCT in the last 72 hours. Sepsis Labs: No results for input(s): PROCALCITON, LATICACIDVEN in the last 168 hours.  No results found for this or any previous visit (from the past 240 hour(s)).       Radiology Studies: No results found.      Scheduled Meds: . citalopram  20 mg Oral Daily  . enoxaparin (LOVENOX) injection  40 mg Subcutaneous Q24H  . folic acid  1 mg Oral Daily  . gabapentin  800 mg Oral TID  . ipratropium-albuterol  3 mL Nebulization Once  . LORazepam  0-4 mg Oral Q12H  . multivitamin with minerals  1 tablet Oral Daily  . thiamine  100 mg Oral Daily   Or  . thiamine  100 mg Intravenous Daily  . traZODone  100 mg Oral QHS   Continuous Infusions:   LOS: 2 days    Time spent: 35    Delaine Lame, MD [facility]  If 7PM-7AM, please contact night-coverage [email or web address] Password [facility] 01/17/2018, 10:35 AM

## 2018-01-18 MED ORDER — GABAPENTIN 400 MG PO CAPS
800.0000 mg | ORAL_CAPSULE | Freq: Three times a day (TID) | ORAL | Status: DC
  Administered 2018-01-18 – 2018-01-21 (×10): 800 mg via ORAL
  Filled 2018-01-18 (×10): qty 2

## 2018-01-18 MED ORDER — IPRATROPIUM-ALBUTEROL 0.5-2.5 (3) MG/3ML IN SOLN: 3.0000 mL | RESPIRATORY_TRACT | Status: DC | PRN

## 2018-01-18 NOTE — Progress Notes (Signed)
PROGRESS NOTE    Charlotte Fisher  WUJ:[id number] DOB: 1965/11/09 DOA: 01/14/2018 PCP: Patient, No Pcp Per   Brief Narrative:  52 year old with past medical history relevant for polysubstance abuse (alcohol, opiates, cocaine), bipolar disorder, COPD coming in with suicidal ideation and request to detox from alcohol.   Assessment & Plan:   Principal Problem:   Substance induced mood disorder (HCC) Active Problems:   Polysubstance abuse (HCC)   Alcohol dependence with uncomplicated withdrawal (HCC)   Alcohol withdrawal (HCC)   #) Polysubstance abuse including alcohol: Patient's last drink was on 01/14/2018.  Her withdrawal symptoms are fairly minimal at this time. -Continue CIWA protocol -Continue with thiamine and folate supplementation -Continue gabapentin 800 mg 3 times daily  #) Suicidal ideation: Patient continues to endorse suicidal ideation mostly due to stress over her boyfriend. -Psychiatry consulted, recommended inpatient evaluation - Pending placement  #) Bipolar disorder: - QTC on EKG on 01/16/2018 is approximately 450 -Continue trazodone 100 mg nightly - Continue citalopram 20 mg daily  #) Pain: - Continue gabapentin -Continue PRN ibuprofen  #) COPD: -Continue PRN bronchodilators  Fluids: Tolerating p.o. Electrodes: Monitor and supplement Nutrition: Regular diet  Prophylaxis: Enoxaparin  Disposition: Pending inpatient psych evaluation  Full code   Consultants:   Psychiatry  Procedures:   None  Antimicrobials:   None   Subjective: Patient reports that she was doing well.  She again endorses suicidal ideation.  She reports that her wheezing is much improved after she got another albuterol inhaler.  She is quite anxious about being discharged as she does not want to be discharged but rather wants to go from here straight to inpatient psychiatry.  She was reassured that this was likely what was going to happen.  Objective: Vitals:   01/17/18 1147  01/17/18 1339 01/17/18 2040 01/18/18 0642  BP:  (!) 141/100 (!) 142/98 (!) 135/96  Pulse:  91 (!) 114 94  Resp:  18 18 18   Temp:  (!) 97.4 F (36.3 C) 98.2 F (36.8 C) 98.4 F (36.9 C)  TempSrc:  Axillary Oral Oral  SpO2: 96% 97% 97% 96%  Weight:      Height:        Intake/Output Summary (Last 24 hours) at 01/18/2018 1152 Last data filed at 01/18/2018 0900 Gross per 24 hour  Intake 1990 ml  Output -  Net 1990 ml   Filed Weights   01/14/18 1153 01/14/18 2250  Weight: 87.5 kg (193 lb) 87.2 kg (192 lb 3.9 oz)    Examination:  General exam: No acute distress Respiratory system: Clear to auscultation. Respiratory effort normal. Cardiovascular system: Regular rate and rhythm, no murmurs Gastrointestinal system: Abdomen is nondistended, soft and nontender. No organomegaly or masses felt. Normal bowel sounds heard. Central nervous system: Alert and oriented. No focal neurological deficits. Extremities: No lower extremity edema Skin: No rashes on visible skin Psychiatry: Judgement and insight appear normal. Mood & affect appropriate.   endorses SI but no HI    Data Reviewed: I have personally reviewed following labs and imaging studies  CBC: Recent Labs  Lab 01/14/18 1217 01/15/18 0530 01/16/18 0818 01/17/18 0528  WBC 7.3 5.4 6.0 8.0  NEUTROABS  --  2.6 3.0  --   HGB 14.0 12.2 12.5 11.8*  HCT 41.8 38.1 39.0 37.2  MCV 92.9 95.0 95.4 96.9  PLT 246 197 192 170   Basic Metabolic Panel: Recent Labs  Lab 01/14/18 1217 01/15/18 0530 01/16/18 0818 01/17/18 0528  NA 146* 144 143 142  K 3.5 3.0* 4.0 4.9  CL 113* 111 114* 110  CO2 23 27 23 28   GLUCOSE 117* 90 116* 101*  BUN 6 5* 5* 8  CREATININE 0.87 0.96 1.05* 0.95  CALCIUM 8.7* 7.9* 8.3* 8.4*  MG  --  1.6* 2.1 2.0   GFR: Estimated Creatinine Clearance: 71 mL/min (by C-G formula based on SCr of 0.95 mg/dL). Liver Function Tests: Recent Labs  Lab 01/14/18 1217 01/15/18 0530 01/16/18 0818  AST 51* 35 36  ALT  50* 37 36  ALKPHOS 95 80 86  BILITOT 0.9 0.9 0.6  PROT 7.5 6.1* 6.7  ALBUMIN 3.4* 2.8* 3.2*   No results for input(s): LIPASE, AMYLASE in the last 168 hours. No results for input(s): AMMONIA in the last 168 hours. Coagulation Profile: No results for input(s): INR, PROTIME in the last 168 hours. Cardiac Enzymes: No results for input(s): CKTOTAL, CKMB, CKMBINDEX, TROPONINI in the last 168 hours. BNP (last 3 results) No results for input(s): PROBNP in the last 8760 hours. HbA1C: No results for input(s): HGBA1C in the last 72 hours. CBG: No results for input(s): GLUCAP in the last 168 hours. Lipid Profile: No results for input(s): CHOL, HDL, LDLCALC, TRIG, CHOLHDL, LDLDIRECT in the last 72 hours. Thyroid Function Tests: No results for input(s): TSH, T4TOTAL, FREET4, T3FREE, THYROIDAB in the last 72 hours. Anemia Panel: No results for input(s): VITAMINB12, FOLATE, FERRITIN, TIBC, IRON, RETICCTPCT in the last 72 hours. Sepsis Labs: No results for input(s): PROCALCITON, LATICACIDVEN in the last 168 hours.  No results found for this or any previous visit (from the past 240 hour(s)).       Radiology Studies: No results found.      Scheduled Meds: . citalopram  20 mg Oral Daily  . enoxaparin (LOVENOX) injection  40 mg Subcutaneous Q24H  . folic acid  1 mg Oral Daily  . gabapentin  800 mg Oral TID  . ipratropium-albuterol  3 mL Nebulization Once  . multivitamin with minerals  1 tablet Oral Daily  . thiamine  100 mg Oral Daily   Or  . thiamine  100 mg Intravenous Daily  . traZODone  100 mg Oral QHS   Continuous Infusions:   LOS: 3 days    Time spent: 35    Delaine LameShrey C Clodagh Odenthal, MD [facility]  If 7PM-7AM, please contact night-coverage [email or web address] Password [email or web address] 01/18/2018, 11:52 AM

## 2018-01-19 DIAGNOSIS — J449 Chronic obstructive pulmonary disease, unspecified: Secondary | ICD-10-CM

## 2018-01-19 DIAGNOSIS — F10230 Alcohol dependence with withdrawal, uncomplicated: Principal | ICD-10-CM

## 2018-01-19 DIAGNOSIS — F1994 Other psychoactive substance use, unspecified with psychoactive substance-induced mood disorder: Secondary | ICD-10-CM

## 2018-01-19 DIAGNOSIS — F1910 Other psychoactive substance abuse, uncomplicated: Secondary | ICD-10-CM

## 2018-01-19 MED ORDER — NICOTINE 21 MG/24HR TD PT24
21.0000 mg | MEDICATED_PATCH | Freq: Every day | TRANSDERMAL | Status: DC
  Administered 2018-01-19 – 2018-01-21 (×3): 21 mg via TRANSDERMAL
  Filled 2018-01-19 (×3): qty 1

## 2018-01-19 MED ORDER — SENNOSIDES-DOCUSATE SODIUM 8.6-50 MG PO TABS: 1.0000 | ORAL_TABLET | Freq: Every day | ORAL | Status: DC | PRN

## 2018-01-19 MED ORDER — TIOTROPIUM BROMIDE MONOHYDRATE 18 MCG IN CAPS
18.0000 ug | ORAL_CAPSULE | Freq: Every day | RESPIRATORY_TRACT | Status: DC
Start: 2018-01-19 — End: 2018-01-21
  Administered 2018-01-20: 18 ug via RESPIRATORY_TRACT
  Filled 2018-01-19: qty 5

## 2018-01-19 MED ORDER — PANTOPRAZOLE SODIUM 40 MG PO TBEC
40.0000 mg | DELAYED_RELEASE_TABLET | Freq: Every day | ORAL | Status: DC
  Administered 2018-01-19 – 2018-01-21 (×3): 40 mg via ORAL
  Filled 2018-01-19 (×3): qty 1

## 2018-01-19 MED ORDER — IBUPROFEN 200 MG PO TABS
400.0000 mg | ORAL_TABLET | Freq: Four times a day (QID) | ORAL | Status: DC | PRN
  Administered 2018-01-19: 400 mg via ORAL
  Filled 2018-01-19: qty 2

## 2018-01-19 MED ORDER — LEVALBUTEROL HCL 0.63 MG/3ML IN NEBU: 0.6300 mg | INHALATION_SOLUTION | RESPIRATORY_TRACT | Status: DC | PRN

## 2018-01-19 MED ORDER — MOMETASONE FURO-FORMOTEROL FUM 100-5 MCG/ACT IN AERO
2.0000 | INHALATION_SPRAY | Freq: Two times a day (BID) | RESPIRATORY_TRACT | Status: DC
Start: 2018-01-19 — End: 2018-01-21
  Administered 2018-01-19 – 2018-01-20 (×3): 2 via RESPIRATORY_TRACT
  Filled 2018-01-19: qty 8.8

## 2018-01-19 MED ORDER — TRAMADOL HCL 50 MG PO TABS
50.0000 mg | ORAL_TABLET | Freq: Four times a day (QID) | ORAL | Status: DC | PRN
  Administered 2018-01-19 – 2018-01-20 (×4): 50 mg via ORAL
  Filled 2018-01-19 (×4): qty 1

## 2018-01-19 NOTE — Progress Notes (Signed)
PROGRESS NOTE    Charlotte Fisher  ZOX:[id number] DOB: 1965-10-17 DOA: 01/14/2018 PCP: Patient, No Pcp Per    Brief Narrative:  52 year old with past medical history relevant for polysubstance abuse (alcohol, opiates, cocaine), bipolar disorder, COPD coming in with suicidal ideation and request to detox from alcohol.     Assessment & Plan:   Principal Problem:   Substance induced mood disorder (HCC) Active Problems:   Polysubstance abuse (HCC)   Alcohol dependence with uncomplicated withdrawal (HCC)   Alcohol withdrawal (HCC)  #1 polysubstance abuse including alcohol Patient's last drink was 01/14/2018.  Improvement with withdrawal symptoms.  Continue the Ativan withdrawal protocol, thiamine, folate, gabapentin 3 times daily.  2.  Depression/anxiety with suicidal ideation due to problematic alcohol use/Bipolar disorder Patient has been seen in consultation by psychiatry who are recommending continuation of home regimen of trazodone for insomnia, Celexa for depression and anxiety, gabapentin for alcohol withdrawal/cravings and anxiety.  QTC at 480.  Psychiatry recommending inpatient psychiatric admission.  Patient currently medically stable and awaiting inpatient psychiatry.  3.  Pain Continue gabapentin.  Decrease ibuprofen to 400 mg every 6 hours as needed as patient with history of alcohol use and concern for gastritis.  Placed on Ultram as needed.  Placed on the PPI.  Follow.  4.  COPD Place on Spiriva and Dulera.  Continue as needed bronchodilators.   DVT prophylaxis: Lovenox Code Status: Full Family Communication: Updated patient.  No family at bedside. Disposition Plan: Medically stable.  Awaiting inpatient psychiatric bed.   Consultants:   Psychiatry: Dr. Sharma Covert 01/15/2018  Procedures:   Chest x-ray 01/14/2018    AntimicrobialS:  None   Subjective: No chest pain.  No shortness of breath.  Improvement with withdrawal symptoms with no tremors currently.  Patient  asking for frequency of pain medications to be decreased for better pain control.  Objective: Vitals:   01/18/18 0642 01/18/18 1242 01/18/18 2022 01/19/18 0508  BP: (!) 135/96 123/88 121/90 122/86  Pulse: 94 79 (!) 102 83  Resp: 18 18 20 20   Temp: 98.4 F (36.9 C) 99.1 F (37.3 C) 98.2 F (36.8 C) 98.3 F (36.8 C)  TempSrc: Oral Oral Oral Oral  SpO2: 96% 92% 93% 97%  Weight:      Height:        Intake/Output Summary (Last 24 hours) at 01/19/2018 1002 Last data filed at 01/19/2018 0600 Gross per 24 hour  Intake 1800 ml  Output -  Net 1800 ml   Filed Weights   01/14/18 1153 01/14/18 2250  Weight: 87.5 kg (193 lb) 87.2 kg (192 lb 3.9 oz)    Examination:  General exam: Appears calm and comfortable  Respiratory system: Clear to auscultation lower lung fields. Respiratory effort normal.  Upper airway noise. Cardiovascular system: S1 & S2 heard, RRR. No JVD, murmurs, rubs, gallops or clicks. No pedal edema. Gastrointestinal system: Abdomen is nondistended, soft and nontender. No organomegaly or masses felt. Normal bowel sounds heard. Central nervous system: Alert and oriented. No focal neurological deficits. Extremities: Symmetric 5 x 5 power. Skin: No rashes, lesions or ulcers Psychiatry: Judgement and insight appear normal. Mood & affect appropriate.     Data Reviewed: I have personally reviewed following labs and imaging studies  CBC: Recent Labs  Lab 01/14/18 1217 01/15/18 0530 01/16/18 0818 01/17/18 0528  WBC 7.3 5.4 6.0 8.0  NEUTROABS  --  2.6 3.0  --   HGB 14.0 12.2 12.5 11.8*  HCT 41.8 38.1 39.0 37.2  MCV 92.9 95.0  95.4 96.9  PLT 246 197 192 170   Basic Metabolic Panel: Recent Labs  Lab 01/14/18 1217 01/15/18 0530 01/16/18 0818 01/17/18 0528  NA 146* 144 143 142  K 3.5 3.0* 4.0 4.9  CL 113* 111 114* 110  CO2 23 27 23 28   GLUCOSE 117* 90 116* 101*  BUN 6 5* 5* 8  CREATININE 0.87 0.96 1.05* 0.95  CALCIUM 8.7* 7.9* 8.3* 8.4*  MG  --  1.6* 2.1 2.0    GFR: Estimated Creatinine Clearance: 71 mL/min (by C-G formula based on SCr of 0.95 mg/dL). Liver Function Tests: Recent Labs  Lab 01/14/18 1217 01/15/18 0530 01/16/18 0818  AST 51* 35 36  ALT 50* 37 36  ALKPHOS 95 80 86  BILITOT 0.9 0.9 0.6  PROT 7.5 6.1* 6.7  ALBUMIN 3.4* 2.8* 3.2*   No results for input(s): LIPASE, AMYLASE in the last 168 hours. No results for input(s): AMMONIA in the last 168 hours. Coagulation Profile: No results for input(s): INR, PROTIME in the last 168 hours. Cardiac Enzymes: No results for input(s): CKTOTAL, CKMB, CKMBINDEX, TROPONINI in the last 168 hours. BNP (last 3 results) No results for input(s): PROBNP in the last 8760 hours. HbA1C: No results for input(s): HGBA1C in the last 72 hours. CBG: No results for input(s): GLUCAP in the last 168 hours. Lipid Profile: No results for input(s): CHOL, HDL, LDLCALC, TRIG, CHOLHDL, LDLDIRECT in the last 72 hours. Thyroid Function Tests: No results for input(s): TSH, T4TOTAL, FREET4, T3FREE, THYROIDAB in the last 72 hours. Anemia Panel: No results for input(s): VITAMINB12, FOLATE, FERRITIN, TIBC, IRON, RETICCTPCT in the last 72 hours. Sepsis Labs: No results for input(s): PROCALCITON, LATICACIDVEN in the last 168 hours.  No results found for this or any previous visit (from the past 240 hour(s)).       Radiology Studies: No results found.      Scheduled Meds: . citalopram  20 mg Oral Daily  . enoxaparin (LOVENOX) injection  40 mg Subcutaneous Q24H  . folic acid  1 mg Oral Daily  . gabapentin  800 mg Oral TID  . ipratropium-albuterol  3 mL Nebulization Once  . multivitamin with minerals  1 tablet Oral Daily  . nicotine  21 mg Transdermal Daily  . pantoprazole  40 mg Oral Daily  . thiamine  100 mg Oral Daily   Or  . thiamine  100 mg Intravenous Daily  . traZODone  100 mg Oral QHS   Continuous Infusions:   LOS: 4 days    Time spent: 35 minutes    Ramiro Harvestaniel Kellye Mizner, MD [facility] Pager [phone]  If 7PM-7AM, please contact night-coverage [email or web address] Password [email or web address] 01/19/2018, 10:02 AM

## 2018-01-19 NOTE — Clinical Social Work Note (Signed)
Clinical Social Work Assessment  Patient Details  Name: Charlotte Fisher MRN: [id number] Date of Birth: 1966-03-16  Date of referral:  01/19/18               Reason for consult:  Facility Placement                Permission sought to share information with:  Facility Art therapist granted to share information::  No  Name::        Agency::  Psych Facilities  Relationship::     Contact Information:     Housing/Transportation Living arrangements for the past 2 months:  No permanent address Source of Information:  Patient Patient Interpreter Needed:  None Criminal Activity/Legal Involvement Pertinent to Current Situation/Hospitalization:  No - Comment as needed Significant Relationships:  Parents Lives with:  Self Do you feel safe going back to the place where you live?  Yes Need for family participation in patient care:  Yes (Comment)  Care giving concerns:  No care giving concerns at the time of assessment.    Social Worker assessment / plan:  LCSW consulted for inpatient psych placement.   Patient admitted for alcohol detox. Psych recommended inpatient psych placement.   LCSW met at bedside with patient. No supports present. Patient has Air cabin crew.   Patient reports that she has been staying with her mother in [place] for the past 6 months. Patient lives in [place] and states she has no family or support. She has an adult son in the area that does not have contact with her due to her alcohol abuse. Patient states she has two adult children in [facility] that are supportive.   Patient reports she was seen outpatient in [facility] but has not established a provider since she returned. Patient plans to get housing and an outpatient provider once she is released from [facility]. Patient reports that she has been to [facility] in the past.   Patient states she does not have any clothes or shoes. LCSW will check clothing closet.   PLAN: Inpatient psych at dc.  Patient is voluntary at the time of assessment.   Employment status:  Unemployed Forensic scientist:  Medicaid In [place] PT Recommendations:  Not assessed at this time Information / Referral to community resources:     Patient/Family's Response to care:  Patient is thankful for LCSW visit and coordination with dc planning.   Patient/Family's Understanding of and Emotional Response to Diagnosis, Current Treatment, and Prognosis:  Patient is realistic about her current situation and agreeable to inpatient psych at dc.   Emotional Assessment Appearance:  Appears younger than stated age Attitude/Demeanor/Rapport:    Affect (typically observed):  Accepting, Calm Orientation:  Oriented to Self, Oriented to Place, Oriented to  Time, Oriented to Situation Alcohol / Substance use:  Not Applicable Psych involvement (Current and /or in the community):  No (Comment)  Discharge Needs  Concerns to be addressed:  Homelessness, Mental Health Concerns Readmission within the last 30 days:  No Current discharge risk:  None Barriers to Discharge:  Psych Bed not available   Servando Snare, LCSW 01/19/2018, 1:32 PM

## 2018-01-19 NOTE — Progress Notes (Signed)
Patient under review at [facility].   Patient faxed out to other psych facilities.   Beulah GandyBernette Alys Dulak, LSCW [facility] [phone]

## 2018-01-20 LAB — BASIC METABOLIC PANEL
Anion gap: 7 (ref 5–15)
BUN: 10 mg/dL (ref 6–20)
CHLORIDE: 101 mmol/L (ref 98–111)
CO2: 30 mmol/L (ref 22–32)
CREATININE: 0.92 mg/dL (ref 0.44–1.00)
Calcium: 8.9 mg/dL (ref 8.9–10.3)
GFR calc non Af Amer: >60 mL/min (ref >60)
Glucose, Bld: 98 mg/dL (ref 70–99)
Potassium: 4.6 mmol/L (ref 3.5–5.1)
SODIUM: 138 mmol/L (ref 135–145)

## 2018-01-20 MED ORDER — HYDROXYZINE HCL 50 MG PO TABS: 100.0000 mg | ORAL_TABLET | Freq: Three times a day (TID) | ORAL | 0 refills | Status: DC | PRN

## 2018-01-20 MED ORDER — NICOTINE 21 MG/24HR TD PT24: 21.0000 mg | MEDICATED_PATCH | Freq: Every day | TRANSDERMAL | 0 refills | Status: DC

## 2018-01-20 MED ORDER — IBUPROFEN 400 MG PO TABS: 400.0000 mg | ORAL_TABLET | Freq: Four times a day (QID) | ORAL | 0 refills | Status: DC | PRN

## 2018-01-20 MED ORDER — SENNOSIDES-DOCUSATE SODIUM 8.6-50 MG PO TABS: 1.0000 | ORAL_TABLET | Freq: Every day | ORAL | Status: DC | PRN

## 2018-01-20 MED ORDER — NALTREXONE HCL 50 MG PO TABS
50.0000 mg | ORAL_TABLET | Freq: Every day | ORAL | Status: DC
  Administered 2018-01-20: 50 mg via ORAL
  Filled 2018-01-20 (×2): qty 1

## 2018-01-20 MED ORDER — TRAMADOL HCL 50 MG PO TABS: 100.0000 mg | ORAL_TABLET | Freq: Four times a day (QID) | ORAL | 0 refills | Status: DC | PRN

## 2018-01-20 MED ORDER — CITALOPRAM HYDROBROMIDE 40 MG PO TABS: 40.0000 mg | ORAL_TABLET | Freq: Every day | ORAL | 0 refills | Status: DC

## 2018-01-20 MED ORDER — ADULT MULTIVITAMIN W/MINERALS CH: 1.0000 | ORAL_TABLET | Freq: Every day | ORAL | Status: DC

## 2018-01-20 MED ORDER — TRAMADOL HCL 50 MG PO TABS
100.0000 mg | ORAL_TABLET | Freq: Four times a day (QID) | ORAL | Status: DC | PRN
  Administered 2018-01-20 – 2018-01-21 (×4): 100 mg via ORAL
  Filled 2018-01-20 (×5): qty 2

## 2018-01-20 MED ORDER — MOMETASONE FURO-FORMOTEROL FUM 100-5 MCG/ACT IN AERO: 2.0000 | INHALATION_SPRAY | Freq: Two times a day (BID) | RESPIRATORY_TRACT | 0 refills | Status: DC

## 2018-01-20 MED ORDER — PANTOPRAZOLE SODIUM 40 MG PO TBEC: 40.0000 mg | DELAYED_RELEASE_TABLET | Freq: Every day | ORAL | 0 refills | Status: DC

## 2018-01-20 MED ORDER — NALTREXONE HCL 50 MG PO TABS
50.0000 mg | ORAL_TABLET | Freq: Every day | ORAL | Status: DC
Start: 2018-01-20 — End: 2018-01-26

## 2018-01-20 MED ORDER — CITALOPRAM HYDROBROMIDE 20 MG PO TABS
40.0000 mg | ORAL_TABLET | Freq: Every day | ORAL | Status: DC
  Administered 2018-01-21: 40 mg via ORAL
  Filled 2018-01-20: qty 2

## 2018-01-20 MED ORDER — THIAMINE HCL 100 MG PO TABS: 100.0000 mg | ORAL_TABLET | Freq: Every day | ORAL | Status: DC

## 2018-01-20 MED ORDER — FOLIC ACID 1 MG PO TABS: 1.0000 mg | ORAL_TABLET | Freq: Every day | ORAL | Status: DC

## 2018-01-20 MED ORDER — TIOTROPIUM BROMIDE MONOHYDRATE 18 MCG IN CAPS: 18.0000 ug | ORAL_CAPSULE | Freq: Every day | RESPIRATORY_TRACT | 12 refills | Status: DC

## 2018-01-20 NOTE — Discharge Summary (Signed)
Physician Discharge Summary  Marcha DuttonMelanie Mcmurry WUJ:[id number] DOB: 06/01/1966 DOA: 01/14/2018  PCP: Patient, No Pcp Per  Admit date: 01/14/2018 Discharge date: 01/20/2018  Time spent: 60 minutes  Recommendations for Outpatient Follow-up:  1. Discharge to inpatient psychiatry.   Discharge Diagnoses:  Principal Problem:   Substance induced mood disorder (HCC) Active Problems:   Polysubstance abuse (HCC)   Alcohol dependence with uncomplicated withdrawal (HCC)   Alcohol withdrawal (HCC)   Discharge Condition: Stable and improved.  Diet recommendation: Regular  Filed Weights   01/14/18 1153 01/14/18 2250  Weight: 87.5 kg (193 lb) 87.2 kg (192 lb 3.9 oz)    History of present illness:  Per Dr. Rodena GoldmannKakrakandy Tineka Tenpas is a 52 y.o. female with history of alcoholism, COPD, neuropathy presents to the ER with wanting to have alcohol detox.  Patient also has some suicidal thoughts.  Patient stated she had been drinking alcohol for long time and wanted to detox.  Denied any chest pain shortness of breath headache nausea vomiting or diarrhea.  Had not attempted any suicide but was having thoughts.  ED Course: In the ER while waiting and was placed on alcohol withdrawal protocol patient became more tachycardic and CIWA score was higher.  And has been admitted for further observation under medical service.  Psychiatric has been consulted for suicide thoughts.      Hospital Course:  1 polysubstance abuse including alcohol Patient's last drink was 01/14/2018.    Patient was admitted placed on the Ativan withdrawal protocol, thiamine, folate acid and gabapentin 3 times daily.  Patient improved clinically.  Had no further tremors and was stable for discharge to inpatient psychiatric hospital once bed available.   2.  Depression/anxiety with suicidal ideation due to problematic alcohol use/Bipolar disorder Patient has been seen in consultation by psychiatry who are recommended continuation of home  regimen of trazodone for insomnia, Celexa for depression and anxiety, gabapentin for alcohol withdrawal/cravings and anxiety.  QTC at 480.  Psychiatry recommended inpatient psychiatric admission.  Patient improved clinically since admission with resolution of withdrawal symptoms after being placed on Ativan withdrawal protocol.  Patient was reassessed by psychiatry on 01/20/2018 and recommendations were made to increase patient's Celexa to 40 mg daily.  Patient was also to be started on naltrexone 50 mg daily for alcohol use.  It was felt patient still needed inpatient psychiatric hospitalization.  Patient medically stable for discharge to inpatient psychiatric hospital.    3.    Chronic pain Patient maintained on home regimen of gabapentin.  Ibuprofen at 400 mg every 6 hours as needed for pain.  Patient was also placed on Ultram 100 mg every 6 hours as needed for breakthrough pain.  Patient maintained on a PPI due to history of alcohol use and concerns for gastritis.  Outpatient follow-up.    4.  COPD Placed on Spiriva and Dulera as well as bronchodilators as needed.  Patient did have some upper airway noise otherwise was satting well on room air.  Outpatient follow-up with PCP.     Procedures:  Chest x-ray 01/14/2018  Consultations:  Psychiatry: Dr. Sharma CovertNorman 01/15/2018, 01/20/2018      Discharge Exam: Vitals:   01/20/18 0423 01/20/18 1316  BP: 118/60 105/75  Pulse: 89 81  Resp: 16 18  Temp: 98.3 F (36.8 C) 98.3 F (36.8 C)  SpO2: 95% 94%    General: NAD Cardiovascular: RRR Respiratory: CTAB  Discharge Instructions   Discharge Instructions    Consult to Peer Support   Complete by:  As directed    Diet general   Complete by:  As directed    Increase activity slowly   Complete by:  As directed      Allergies as of 01/20/2018   No Known Allergies     Medication List    STOP taking these medications   albuterol 108 (90 Base) MCG/ACT inhaler Commonly known as:   PROVENTIL HFA;VENTOLIN HFA   amitriptyline 10 MG tablet Commonly known as:  ELAVIL   chlordiazePOXIDE 25 MG capsule Commonly known as:  LIBRIUM   hydrOXYzine 100 MG capsule Commonly known as:  VISTARIL   potassium chloride 10 MEQ tablet Commonly known as:  K-DUR   QUEtiapine 50 MG tablet Commonly known as:  SEROQUEL   sucralfate 1 g tablet Commonly known as:  CARAFATE     TAKE these medications   albuterol-ipratropium 18-103 MCG/ACT inhaler Commonly known as:  COMBIVENT Inhale 1-2 puffs into the lungs every 4 (four) hours as needed for wheezing or shortness of breath.   citalopram 40 MG tablet Commonly known as:  CELEXA Take 1 tablet (40 mg total) by mouth daily. What changed:    medication strength  how much to take   folic acid 1 MG tablet Commonly known as:  FOLVITE Take 1 tablet (1 mg total) by mouth daily. Start taking on:  01/21/2018   gabapentin 800 MG tablet Commonly known as:  NEURONTIN Take 1 tablet (800 mg total) by mouth 3 (three) times daily.   hydrOXYzine 50 MG tablet Commonly known as:  ATARAX/VISTARIL Take 2 tablets (100 mg total) by mouth 3 (three) times daily as needed for anxiety. What changed:    medication strength  how much to take  when to take this   ibuprofen 400 MG tablet Commonly known as:  ADVIL,MOTRIN Take 1 tablet (400 mg total) by mouth every 6 (six) hours as needed for mild pain.   mometasone-formoterol 100-5 MCG/ACT Aero Commonly known as:  DULERA Inhale 2 puffs into the lungs 2 (two) times daily.   multivitamin with minerals Tabs tablet Take 1 tablet by mouth daily. Start taking on:  01/21/2018   naltrexone 50 MG tablet Commonly known as:  DEPADE Take 1 tablet (50 mg total) by mouth daily.   nicotine 21 mg/24hr patch Commonly known as:  NICODERM CQ - dosed in mg/24 hours Place 1 patch (21 mg total) onto the skin daily. Start taking on:  01/21/2018   pantoprazole 40 MG tablet Commonly known as:  PROTONIX Take  1 tablet (40 mg total) by mouth daily. What changed:    medication strength  how much to take   promethazine 25 MG tablet Commonly known as:  PHENERGAN Take 1 tablet (25 mg total) by mouth every 6 (six) hours as needed for nausea or vomiting.   senna-docusate 8.6-50 MG tablet Commonly known as:  Senokot-S Take 1 tablet by mouth daily as needed for mild constipation.   thiamine 100 MG tablet Take 1 tablet (100 mg total) by mouth daily. Start taking on:  01/21/2018   tiotropium 18 MCG inhalation capsule Commonly known as:  SPIRIVA Place 1 capsule (18 mcg total) into inhaler and inhale daily. Start taking on:  01/21/2018   traMADol 50 MG tablet Commonly known as:  ULTRAM Take 2 tablets (100 mg total) by mouth every 6 (six) hours as needed for severe pain.   traZODone 100 MG tablet Commonly known as:  DESYREL Take 100 mg by mouth at bedtime.  No Known Allergies Follow-up Information    MD AT INPATIENT PSYCH Follow up.            The results of significant diagnostics from this hospitalization (including imaging, microbiology, ancillary and laboratory) are listed below for reference.    Significant Diagnostic Studies: Dg Chest 2 View  Result Date: 01/14/2018 CLINICAL DATA:  Shortness of Breath EXAM: CHEST - 2 VIEW COMPARISON:  01/14/2018 FINDINGS: Mild peribronchial thickening. Heart and mediastinal contours are within normal limits. No focal opacities or effusions. No acute bony abnormality. IMPRESSION: Mild bronchitic changes. Electronically Signed   By: Charlett Nose M.D.   On: 01/14/2018 18:12    Microbiology: No results found for this or any previous visit (from the past 240 hour(s)).   Labs: Basic Metabolic Panel: Recent Labs  Lab 01/14/18 1217 01/15/18 0530 01/16/18 0818 01/17/18 0528 01/20/18 0530  NA 146* 144 143 142 138  K 3.5 3.0* 4.0 4.9 4.6  CL 113* 111 114* 110 101  CO2 23 27 23 28 30   GLUCOSE 117* 90 116* 101* 98  BUN 6 5* 5* 8 10   CREATININE 0.87 0.96 1.05* 0.95 0.92  CALCIUM 8.7* 7.9* 8.3* 8.4* 8.9  MG  --  1.6* 2.1 2.0  --    Liver Function Tests: Recent Labs  Lab 01/14/18 1217 01/15/18 0530 01/16/18 0818  AST 51* 35 36  ALT 50* 37 36  ALKPHOS 95 80 86  BILITOT 0.9 0.9 0.6  PROT 7.5 6.1* 6.7  ALBUMIN 3.4* 2.8* 3.2*   No results for input(s): LIPASE, AMYLASE in the last 168 hours. No results for input(s): AMMONIA in the last 168 hours. CBC: Recent Labs  Lab 01/14/18 1217 01/15/18 0530 01/16/18 0818 01/17/18 0528  WBC 7.3 5.4 6.0 8.0  NEUTROABS  --  2.6 3.0  --   HGB 14.0 12.2 12.5 11.8*  HCT 41.8 38.1 39.0 37.2  MCV 92.9 95.0 95.4 96.9  PLT 246 197 192 170   Cardiac Enzymes: No results for input(s): CKTOTAL, CKMB, CKMBINDEX, TROPONINI in the last 168 hours. BNP: BNP (last 3 results) No results for input(s): BNP in the last 8760 hours.  ProBNP (last 3 results) No results for input(s): PROBNP in the last 8760 hours.  CBG: No results for input(s): GLUCAP in the last 168 hours.     Signed:  Ramiro Harvest MD.  [facility] 01/20/2018, 3:31 PM
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
## 2018-01-21 ENCOUNTER — Encounter (HOSPITAL_COMMUNITY): Payer: Self-pay | Admitting: *Deleted

## 2018-01-21 ENCOUNTER — Inpatient Hospital Stay (HOSPITAL_COMMUNITY)
Admission: AD | Admit: 2018-01-21 | Discharge: 2018-01-27 | DRG: 885 | Disposition: A | Discharge status: Home / Self Care | Payer: Medicaid Other | Source: Intra-hospital | Attending: Psychiatry | Admitting: Psychiatry

## 2018-01-21 ENCOUNTER — Other Ambulatory Visit: Payer: Self-pay

## 2018-01-21 DIAGNOSIS — F1910 Other psychoactive substance abuse, uncomplicated: Secondary | ICD-10-CM | POA: Diagnosis not present

## 2018-01-21 DIAGNOSIS — F410 Panic disorder [episodic paroxysmal anxiety] without agoraphobia: Secondary | ICD-10-CM | POA: Diagnosis present

## 2018-01-21 DIAGNOSIS — F1124 Opioid dependence with opioid-induced mood disorder: Secondary | ICD-10-CM | POA: Diagnosis present

## 2018-01-21 DIAGNOSIS — F10230 Alcohol dependence with withdrawal, uncomplicated: Secondary | ICD-10-CM | POA: Diagnosis present

## 2018-01-21 DIAGNOSIS — D649 Anemia, unspecified: Secondary | ICD-10-CM | POA: Diagnosis present

## 2018-01-21 DIAGNOSIS — F1994 Other psychoactive substance use, unspecified with psychoactive substance-induced mood disorder: Secondary | ICD-10-CM | POA: Diagnosis present

## 2018-01-21 DIAGNOSIS — Z91410 Personal history of adult physical and sexual abuse: Secondary | ICD-10-CM | POA: Diagnosis not present

## 2018-01-21 DIAGNOSIS — F1414 Cocaine abuse with cocaine-induced mood disorder: Secondary | ICD-10-CM | POA: Diagnosis present

## 2018-01-21 DIAGNOSIS — F17210 Nicotine dependence, cigarettes, uncomplicated: Secondary | ICD-10-CM | POA: Diagnosis present

## 2018-01-21 DIAGNOSIS — B1920 Unspecified viral hepatitis C without hepatic coma: Secondary | ICD-10-CM | POA: Diagnosis present

## 2018-01-21 DIAGNOSIS — G4700 Insomnia, unspecified: Secondary | ICD-10-CM | POA: Diagnosis not present

## 2018-01-21 DIAGNOSIS — F4310 Post-traumatic stress disorder, unspecified: Secondary | ICD-10-CM | POA: Diagnosis present

## 2018-01-21 DIAGNOSIS — G4710 Hypersomnia, unspecified: Secondary | ICD-10-CM | POA: Diagnosis present

## 2018-01-21 DIAGNOSIS — Z590 Homelessness: Secondary | ICD-10-CM | POA: Diagnosis not present

## 2018-01-21 DIAGNOSIS — F3131 Bipolar disorder, current episode depressed, mild: Secondary | ICD-10-CM | POA: Diagnosis not present

## 2018-01-21 DIAGNOSIS — J449 Chronic obstructive pulmonary disease, unspecified: Secondary | ICD-10-CM | POA: Diagnosis present

## 2018-01-21 DIAGNOSIS — Z91411 Personal history of adult psychological abuse: Secondary | ICD-10-CM

## 2018-01-21 DIAGNOSIS — K219 Gastro-esophageal reflux disease without esophagitis: Secondary | ICD-10-CM | POA: Diagnosis present

## 2018-01-21 DIAGNOSIS — Z79899 Other long term (current) drug therapy: Secondary | ICD-10-CM | POA: Diagnosis not present

## 2018-01-21 DIAGNOSIS — Z915 Personal history of self-harm: Secondary | ICD-10-CM

## 2018-01-21 DIAGNOSIS — N189 Chronic kidney disease, unspecified: Secondary | ICD-10-CM | POA: Diagnosis present

## 2018-01-21 DIAGNOSIS — F419 Anxiety disorder, unspecified: Secondary | ICD-10-CM | POA: Diagnosis not present

## 2018-01-21 DIAGNOSIS — F1024 Alcohol dependence with alcohol-induced mood disorder: Secondary | ICD-10-CM | POA: Diagnosis present

## 2018-01-21 DIAGNOSIS — R197 Diarrhea, unspecified: Secondary | ICD-10-CM | POA: Diagnosis present

## 2018-01-21 DIAGNOSIS — R1013 Epigastric pain: Secondary | ICD-10-CM | POA: Diagnosis present

## 2018-01-21 DIAGNOSIS — F319 Bipolar disorder, unspecified: Secondary | ICD-10-CM | POA: Diagnosis present

## 2018-01-21 DIAGNOSIS — F3132 Bipolar disorder, current episode depressed, moderate: Secondary | ICD-10-CM | POA: Diagnosis present

## 2018-01-21 DIAGNOSIS — Z7951 Long term (current) use of inhaled steroids: Secondary | ICD-10-CM

## 2018-01-21 DIAGNOSIS — Z818 Family history of other mental and behavioral disorders: Secondary | ICD-10-CM | POA: Diagnosis not present

## 2018-01-21 MED ORDER — ONDANSETRON HCL 4 MG PO TABS
4.0000 mg | ORAL_TABLET | Freq: Once | ORAL | Status: AC
  Administered 2018-01-21: 4 mg via ORAL
  Filled 2018-01-21 (×2): qty 1

## 2018-01-21 MED ORDER — ALBUTEROL SULFATE HFA 108 (90 BASE) MCG/ACT IN AERS
1.0000 | INHALATION_SPRAY | RESPIRATORY_TRACT | Status: DC | PRN
  Administered 2018-01-24 – 2018-01-25 (×2): 2 via RESPIRATORY_TRACT
  Administered 2018-01-25: 1 via RESPIRATORY_TRACT
  Administered 2018-01-26 – 2018-01-27 (×3): 2 via RESPIRATORY_TRACT
  Filled 2018-01-21: qty 6.7

## 2018-01-21 MED ORDER — NALTREXONE HCL 50 MG PO TABS
50.0000 mg | ORAL_TABLET | Freq: Every day | ORAL | Status: DC
  Administered 2018-01-23: 50 mg via ORAL
  Filled 2018-01-21 (×4): qty 1

## 2018-01-21 MED ORDER — SENNOSIDES-DOCUSATE SODIUM 8.6-50 MG PO TABS: 1.0000 | ORAL_TABLET | Freq: Every day | ORAL | Status: DC | PRN

## 2018-01-21 MED ORDER — IBUPROFEN 400 MG PO TABS
400.0000 mg | ORAL_TABLET | Freq: Four times a day (QID) | ORAL | Status: DC | PRN
  Administered 2018-01-21 – 2018-01-25 (×6): 400 mg via ORAL
  Filled 2018-01-21 (×6): qty 1

## 2018-01-21 MED ORDER — LEVALBUTEROL HCL 0.63 MG/3ML IN NEBU
0.6300 mg | INHALATION_SOLUTION | RESPIRATORY_TRACT | Status: DC | PRN
  Filled 2018-01-21: qty 3

## 2018-01-21 MED ORDER — GABAPENTIN 400 MG PO CAPS
800.0000 mg | ORAL_CAPSULE | Freq: Three times a day (TID) | ORAL | Status: DC
  Administered 2018-01-21 – 2018-01-27 (×17): 800 mg via ORAL
  Filled 2018-01-21 (×26): qty 2

## 2018-01-21 MED ORDER — FOLIC ACID 1 MG PO TABS
1.0000 mg | ORAL_TABLET | Freq: Every day | ORAL | Status: DC
  Administered 2018-01-22 – 2018-01-27 (×6): 1 mg via ORAL
  Filled 2018-01-21 (×9): qty 1

## 2018-01-21 MED ORDER — TRAMADOL HCL 50 MG PO TABS
100.0000 mg | ORAL_TABLET | Freq: Four times a day (QID) | ORAL | Status: DC | PRN
  Administered 2018-01-21 – 2018-01-24 (×7): 100 mg via ORAL
  Filled 2018-01-21 (×7): qty 2

## 2018-01-21 MED ORDER — ENOXAPARIN SODIUM 40 MG/0.4ML ~~LOC~~ SOLN
40.0000 mg | SUBCUTANEOUS | Status: DC
  Filled 2018-01-21 (×2): qty 0.4

## 2018-01-21 MED ORDER — TRAZODONE HCL 100 MG PO TABS
100.0000 mg | ORAL_TABLET | Freq: Every day | ORAL | Status: DC
  Administered 2018-01-22 – 2018-01-23 (×2): 100 mg via ORAL
  Filled 2018-01-21 (×6): qty 1

## 2018-01-21 MED ORDER — CITALOPRAM HYDROBROMIDE 40 MG PO TABS
40.0000 mg | ORAL_TABLET | Freq: Every day | ORAL | Status: DC
  Administered 2018-01-22: 40 mg via ORAL
  Filled 2018-01-21: qty 2
  Filled 2018-01-21 (×3): qty 1

## 2018-01-21 MED ORDER — TIOTROPIUM BROMIDE MONOHYDRATE 18 MCG IN CAPS
18.0000 ug | ORAL_CAPSULE | Freq: Every day | RESPIRATORY_TRACT | Status: DC
  Administered 2018-01-22 – 2018-01-26 (×5): 18 ug via RESPIRATORY_TRACT
  Filled 2018-01-21 (×2): qty 5

## 2018-01-21 MED ORDER — MOMETASONE FURO-FORMOTEROL FUM 100-5 MCG/ACT IN AERO
2.0000 | INHALATION_SPRAY | Freq: Two times a day (BID) | RESPIRATORY_TRACT | Status: DC
  Administered 2018-01-22 – 2018-01-27 (×11): 2 via RESPIRATORY_TRACT
  Filled 2018-01-21 (×2): qty 8.8

## 2018-01-21 MED ORDER — ADULT MULTIVITAMIN W/MINERALS CH
1.0000 | ORAL_TABLET | Freq: Every day | ORAL | Status: DC
  Administered 2018-01-22: 1 via ORAL
  Filled 2018-01-21 (×4): qty 1

## 2018-01-21 MED ORDER — NICOTINE 21 MG/24HR TD PT24
21.0000 mg | MEDICATED_PATCH | Freq: Every day | TRANSDERMAL | Status: DC
  Administered 2018-01-22 – 2018-01-24 (×3): 21 mg via TRANSDERMAL
  Filled 2018-01-21 (×9): qty 1

## 2018-01-21 MED ORDER — PANTOPRAZOLE SODIUM 40 MG PO TBEC
40.0000 mg | DELAYED_RELEASE_TABLET | Freq: Every day | ORAL | Status: DC
  Administered 2018-01-22 – 2018-01-27 (×6): 40 mg via ORAL
  Filled 2018-01-21 (×9): qty 1

## 2018-01-21 MED ORDER — HYDROXYZINE HCL 50 MG PO TABS
100.0000 mg | ORAL_TABLET | Freq: Three times a day (TID) | ORAL | Status: DC | PRN
  Administered 2018-01-21 – 2018-01-22 (×2): 100 mg via ORAL
  Filled 2018-01-21 (×3): qty 2

## 2018-01-21 NOTE — Progress Notes (Signed)
PROGRESS NOTE    Charlotte DuttonMelanie Fisher  RUE:[id number]:[id number] DOB: 11/30/1965 DOA: 01/14/2018 PCP: Patient, No Pcp Per    Brief Narrative:  52 year old with past medical history relevant for polysubstance abuse (alcohol, opiates, cocaine), bipolar disorder, COPD coming in with suicidal ideation and request to detox from alcohol.     Assessment & Plan:   Principal Problem:   Substance induced mood disorder (HCC) Active Problems:   Polysubstance abuse (HCC)   Alcohol dependence with uncomplicated withdrawal (HCC)   Alcohol withdrawal (HCC)  #1 polysubstance abuse including alcohol Patient's last drink was 01/14/2018.  Improvement with withdrawal symptoms.  Continue the Ativan withdrawal protocol, thiamine, folate, gabapentin 3 times daily.  Patient started on naltrexone for alcohol use per psychiatry 01/20/2018.  2.  Depression/anxiety with suicidal ideation due to problematic alcohol use/Bipolar disorder Patient has been seen in consultation by psychiatry who are recommending continuation of home regimen of trazodone for insomnia, Celexa for depression and anxiety, gabapentin for alcohol withdrawal/cravings and anxiety.  QTC at 480.  Celexa dose increased per psychiatry yesterday.  Patient started on naltrexone 50 mg daily for alcohol use per psychiatry yesterday 01/20/2018.  Psychiatry recommending inpatient psychiatric admission.  Patient currently medically stable and awaiting inpatient psychiatry.  3.  Pain Continue gabapentin.  Decreased ibuprofen to 400 mg every 6 hours as needed as patient with history of alcohol use and concern for gastritis.  Continue current regimen of Ultram as needed.  Continue PPI.  Follow.  4.  COPD Continue Spiriva and Dulera.  Continue as needed bronchodilators.   DVT prophylaxis: Lovenox Code Status: Full Family Communication: Updated patient.  No family at bedside. Disposition Plan: Medically stable.  [facility]    Consultants:   Psychiatry: Dr. Sharma CovertNorman  01/15/2018  Procedures:   Chest x-ray 01/14/2018    AntimicrobialS:  None   Subjective: In bed.  No chest pain.  No shortness of breath.  No tremors noted.  Withdrawal symptoms improved.  Pain improved with increased regimen of Ultram.  Patient states had some nausea with medication given last night but could not quite tell which medication it was.   Objective: Vitals:   01/21/18 0042 01/21/18 0516 01/21/18 0618 01/21/18 1213  BP: (!) 141/96 (!) 136/102 121/67 132/89  Pulse: 93 89 84 77  Resp: 18 18    Temp: 98.4 F (36.9 C) 97.6 F (36.4 C)  98.2 F (36.8 C)  TempSrc: Oral Oral  Oral  SpO2: 94% 94%  95%  Weight:      Height:        Intake/Output Summary (Last 24 hours) at 01/21/2018 1418 Last data filed at 01/21/2018 0507 Gross per 24 hour  Intake 480 ml  Output 400 ml  Net 80 ml   Filed Weights   01/14/18 1153 01/14/18 2250  Weight: 87.5 kg (193 lb) 87.2 kg (192 lb 3.9 oz)    Examination:  General exam: Appears calm and comfortable  Respiratory system: Clear to auscultation lower lung fields. Respiratory effort normal.  Upper airway noise. Cardiovascular system: S1 & S2 heard, RRR. No JVD, murmurs, rubs, gallops or clicks. No pedal edema. Gastrointestinal system: Abdomen is nondistended, soft and nontender. No organomegaly or masses felt. Normal bowel sounds heard. Central nervous system: Alert and oriented. No focal neurological deficits. Extremities: Symmetric 5 x 5 power. Skin: No rashes, lesions or ulcers Psychiatry: Judgement and insight appear normal. Mood & affect flat.     Data Reviewed: I have personally reviewed following labs and imaging studies  CBC: Recent Labs  Lab 01/15/18 0530 01/16/18 0818 01/17/18 0528  WBC 5.4 6.0 8.0  NEUTROABS 2.6 3.0  --   HGB 12.2 12.5 11.8*  HCT 38.1 39.0 37.2  MCV 95.0 95.4 96.9  PLT 197 192 170   Basic Metabolic Panel: Recent Labs  Lab 01/15/18 0530 01/16/18 0818 01/17/18 0528 01/20/18 0530  NA 144  143 142 138  K 3.0* 4.0 4.9 4.6  CL 111 114* 110 101  CO2 27 23 28 30   GLUCOSE 90 116* 101* 98  BUN 5* 5* 8 10  CREATININE 0.96 1.05* 0.95 0.92  CALCIUM 7.9* 8.3* 8.4* 8.9  MG 1.6* 2.1 2.0  --    GFR: Estimated Creatinine Clearance: 73.3 mL/min (by C-G formula based on SCr of 0.92 mg/dL). Liver Function Tests: Recent Labs  Lab 01/15/18 0530 01/16/18 0818  AST 35 36  ALT 37 36  ALKPHOS 80 86  BILITOT 0.9 0.6  PROT 6.1* 6.7  ALBUMIN 2.8* 3.2*   No results for input(s): LIPASE, AMYLASE in the last 168 hours. No results for input(s): AMMONIA in the last 168 hours. Coagulation Profile: No results for input(s): INR, PROTIME in the last 168 hours. Cardiac Enzymes: No results for input(s): CKTOTAL, CKMB, CKMBINDEX, TROPONINI in the last 168 hours. BNP (last 3 results) No results for input(s): PROBNP in the last 8760 hours. HbA1C: No results for input(s): HGBA1C in the last 72 hours. CBG: No results for input(s): GLUCAP in the last 168 hours. Lipid Profile: No results for input(s): CHOL, HDL, LDLCALC, TRIG, CHOLHDL, LDLDIRECT in the last 72 hours. Thyroid Function Tests: No results for input(s): TSH, T4TOTAL, FREET4, T3FREE, THYROIDAB in the last 72 hours. Anemia Panel: No results for input(s): VITAMINB12, FOLATE, FERRITIN, TIBC, IRON, RETICCTPCT in the last 72 hours. Sepsis Labs: No results for input(s): PROCALCITON, LATICACIDVEN in the last 168 hours.  No results found for this or any previous visit (from the past 240 hour(s)).       Radiology Studies: No results found.      Scheduled Meds: . citalopram  40 mg Oral Daily  . enoxaparin (LOVENOX) injection  40 mg Subcutaneous Q24H  . folic acid  1 mg Oral Daily  . gabapentin  800 mg Oral TID  . ipratropium-albuterol  3 mL Nebulization Once  . mometasone-formoterol  2 puff Inhalation BID  . multivitamin with minerals  1 tablet Oral Daily  . naltrexone  50 mg Oral Daily  . nicotine  21 mg Transdermal Daily  .  pantoprazole  40 mg Oral Daily  . thiamine  100 mg Oral Daily   Or  . thiamine  100 mg Intravenous Daily  . tiotropium  18 mcg Inhalation Daily  . traZODone  100 mg Oral QHS   Continuous Infusions:   LOS: 6 days    Time spent: 35 minutes    Ramiro Harvest, MD [facility] Pager [phone]  If 7PM-7AM, please contact night-coverage [email or web address] Password [email or web address] 01/21/2018, 2:18 PM

## 2018-01-21 NOTE — Progress Notes (Addendum)
[facility].   [facility]  LCSW will continue to follow for placement.   Beulah GandyBernette Brandell Maready, LSCW [facility] CSW [phone]

## 2018-01-21 NOTE — Progress Notes (Signed)
Psychoeducational Group Note  Date:  01/21/2018 Time:2045  Group Topic/Focus:  wrap up group  Participation Level: Did Not Attend  Participation Quality:  Not Applicable  Affect:  Not Applicable  Cognitive:  Not Applicable  Insight:  Not Applicable  Engagement in Group: Not Applicable  Additional Comments:  Pt was notified that group was beginning but remained in bed.   Marcille BuffyMcNeil, Muriel Hannold S 01/21/2018, 9:42 PM

## 2018-01-21 NOTE — Tx Team (Signed)
Initial Treatment Plan 01/21/2018 3:43 PM Charlotte DuttonMelanie Fisher PPI:[id number]    PATIENT STRESSORS: Health problems Marital or family conflict Substance abuse   PATIENT STRENGTHS: Ability for insight Average or above average intelligence Capable of independent living General fund of knowledge   PATIENT IDENTIFIED PROBLEMS: Depression Substance abuse Suicidal thoughts "I need to stay sober again"                     DISCHARGE CRITERIA:  Ability to meet basic life and health needs Improved stabilization in mood, thinking, and/or behavior Reduction of life-threatening or endangering symptoms to within safe limits Verbal commitment to aftercare and medication compliance  PRELIMINARY DISCHARGE PLAN: Attend aftercare/continuing care group  PATIENT/FAMILY INVOLVEMENT: This treatment plan has been presented to and reviewed with the patient, Charlotte DuttonMelanie Fisher, and/or family member, .  The patient and family have been given the opportunity to ask questions and make suggestions.  Charlotte Fisher, Charlotte Fisher, [place]RN 01/21/2018, 3:43 PM

## 2018-01-21 NOTE — Progress Notes (Signed)
[facility].  Attending: [place] Accepting: Sharma CovertNorman Patient is voluntary and will transport by Juel [facility]rowPel[facility].  RN report number: [phone] Please call report before patient transport.    Beulah GandyBernette Sanjuana Mruk, LSCW [facility] CSW [phone]

## 2018-01-21 NOTE — Progress Notes (Signed)
Charlotte Fisher is a 52 year old female pt admitted on voluntary basis from medical floor. On admission she reports that she initially called 911 because she was having troubles breathing. She subsequently spoke about depression, SI, and substance abuse while there. She denies any current SI and is able to contract for safety while in the hospital. She reports anxiety and nausea on admission and reports that she was detoxed while in the hospital. She spoke about how she has made poor choices in the past and spoke about how she needs to maintain her sobriety. She spoke about how she was living with her mother but reports she will not go back there but reports that she does have a place to go after discharge. Charlotte Fisher was admitted to the unit and safety maintained.

## 2018-01-21 NOTE — Progress Notes (Signed)
Patient was complaining of "flu like" symptoms. MD notified and a one time dose of Zofran was ordered. Patient is extremely hyper focused on medications, especially Tramadol due to her leg pain. Rest encouraged and support was given.

## 2018-01-21 NOTE — Plan of Care (Signed)
D: Pt stayed in room majority of the evening. Pt forwards little information this evening, pt has been sleep majority of this shift.  A: Pt was offered support and encouragement. Pt was given scheduled medications. Pt was encourage to attend groups. Q 15 minute checks were done for safety.   R:Pt attends groups and interacts well with peers and staff. Pt is taking medication. Pt has no complaints.Pt receptive to treatment and safety maintained on unit.   Problem: Safety: Goal: Periods of time without injury will increase Outcome: Progressing

## 2018-01-22 LAB — BASIC METABOLIC PANEL
ANION GAP: 9 (ref 5–15)
BUN: 17 mg/dL (ref 6–20)
CO2: 30 mmol/L (ref 22–32)
Calcium: 9 mg/dL (ref 8.9–10.3)
Chloride: 103 mmol/L (ref 98–111)
Creatinine, Ser: 1.04 mg/dL — ABNORMAL HIGH (ref 0.44–1.00)
GLUCOSE: 83 mg/dL (ref 70–99)
POTASSIUM: 4.4 mmol/L (ref 3.5–5.1)
Sodium: 142 mmol/L (ref 135–145)

## 2018-01-22 MED ORDER — CHLORDIAZEPOXIDE HCL 25 MG PO CAPS: 25.0000 mg | ORAL_CAPSULE | Freq: Four times a day (QID) | ORAL | Status: AC | PRN

## 2018-01-22 MED ORDER — HYDROXYZINE HCL 25 MG PO TABS
25.0000 mg | ORAL_TABLET | Freq: Four times a day (QID) | ORAL | Status: DC | PRN
  Administered 2018-01-22 – 2018-01-23 (×2): 25 mg via ORAL
  Filled 2018-01-22 (×2): qty 1

## 2018-01-22 MED ORDER — THIAMINE HCL 100 MG/ML IJ SOLN
100.0000 mg | Freq: Once | INTRAMUSCULAR | Status: AC
  Administered 2018-01-22: 100 mg via INTRAMUSCULAR
  Filled 2018-01-22: qty 2

## 2018-01-22 MED ORDER — ONDANSETRON HCL 4 MG PO TABS: 4.0000 mg | ORAL_TABLET | ORAL | Status: DC | PRN

## 2018-01-22 MED ORDER — LOPERAMIDE HCL 2 MG PO CAPS: 2.0000 mg | ORAL_CAPSULE | ORAL | Status: AC | PRN

## 2018-01-22 MED ORDER — ONDANSETRON 4 MG PO TBDP
4.0000 mg | ORAL_TABLET | Freq: Four times a day (QID) | ORAL | Status: AC | PRN
  Administered 2018-01-22 – 2018-01-25 (×4): 4 mg via ORAL
  Filled 2018-01-22 (×4): qty 1

## 2018-01-22 MED ORDER — VITAMIN B-1 100 MG PO TABS
100.0000 mg | ORAL_TABLET | Freq: Every day | ORAL | Status: DC
  Administered 2018-01-23 – 2018-01-27 (×5): 100 mg via ORAL
  Filled 2018-01-22 (×7): qty 1

## 2018-01-22 MED ORDER — CITALOPRAM HYDROBROMIDE 20 MG PO TABS
20.0000 mg | ORAL_TABLET | Freq: Every day | ORAL | Status: DC
  Administered 2018-01-23 – 2018-01-27 (×5): 20 mg via ORAL
  Filled 2018-01-22 (×7): qty 1

## 2018-01-22 MED ORDER — ADULT MULTIVITAMIN W/MINERALS CH
1.0000 | ORAL_TABLET | Freq: Every day | ORAL | Status: DC
  Administered 2018-01-22 – 2018-01-27 (×6): 1 via ORAL
  Filled 2018-01-22 (×8): qty 1

## 2018-01-22 NOTE — [facility] Suicide Risk Assessment (Signed)
[facility] INPATIENT:  Family/Significant Other Suicide Prevention Education  Suicide Prevention Education:  Patient Refusal for Family/Significant Other Suicide Prevention Education: The patient Charlotte Fisher has refused to provide written consent for family/significant other to be provided Family/Significant Other Suicide Prevention Education during admission and/or prior to discharge.  Physician notified.  SPE completed with pt, as pt refused to consent to family contact. SPI pamphlet provided to pt and pt was encouraged to share information with support network, ask questions, and talk about any concerns relating to SPE. Pt denies access to guns/firearms and verbalized understanding of information provided. Mobile Crisis information also provided to pt.   Rona RavensHeather S Jeffry Vogelsang LCSW 01/22/2018, 11:46 AM

## 2018-01-22 NOTE — Progress Notes (Signed)
Recreation Therapy Notes  Date: 7.26.19 Time: 0930 Location: [address] Dayroom  Group Topic: Stress Management  Goal Area(s) Addresses:  Patient will verbalize importance of using healthy stress management.  Patient will identify positive emotions associated with healthy stress management.   Intervention: Stress Management  Activity : Progressive Muscle Relaxation.  LRT introduced the stress management technique of progressive muscle relaxation.  LRT read a script that guided patients through a series of tensing muscles and then relaxing.  Patients were to follow along as LRT read script to engage in the activity.  Education:  Stress Management, Discharge Planning.   Education Outcome: Acknowledges edcuation/In group clarification offered/Needs additional education  Clinical Observations/Feedback: Pt did not attend group.    [name], LRT/CTRS         [name], [name] 01/22/2018 10:59 AM

## 2018-01-22 NOTE — Plan of Care (Signed)
D: Pt denies SI/HI/AVH. Pt is irritable , pt only focus is on her pain medication and when she can get it.  A: Pt was offered support and encouragement. Pt was given scheduled medications. Pt was encourage to attend groups. Q 15 minute checks were done for safety.  R: safety maintained on unit.   Problem: Safety: Goal: Periods of time without injury will increase Outcome: Progressing

## 2018-01-22 NOTE — Tx Team (Signed)
Interdisciplinary Treatment and Diagnostic Plan Update  01/22/2018 Time of Session: [place] MRN: [id number]  Principal Diagnosis: Bipolar Disorder  Secondary Diagnoses: Active Problems:   Bipolar affective disorder ([place])   Current Medications:  Current Facility-Administered Medications  Medication Dose Route Frequency Provider Last Rate Last Dose  . albuterol (PROVENTIL HFA;VENTOLIN HFA) 108 (90 Base) MCG/ACT inhaler 1-2 puff  1-2 puff Inhalation Q4H PRN Rankin, Shuvon B, NP      . citalopram (CELEXA) tablet 40 mg  40 mg Oral Daily Rankin, Shuvon B, NP   40 mg at 01/22/18 0754  . enoxaparin (LOVENOX) injection 40 mg  40 mg Subcutaneous Q24H Rankin, Shuvon B, NP      . folic acid (FOLVITE) tablet 1 mg  1 mg Oral Daily Rankin, Shuvon B, NP   1 mg at 01/22/18 0754  . gabapentin (NEURONTIN) capsule 800 mg  800 mg Oral TID Rankin, Shuvon B, NP   800 mg at 01/22/18 0754  . hydrOXYzine (ATARAX/VISTARIL) tablet 100 mg  100 mg Oral TID PRN Rankin, Shuvon B, NP   100 mg at 01/21/18 1642  . ibuprofen (ADVIL,MOTRIN) tablet 400 mg  400 mg Oral Q6H PRN Rankin, Shuvon B, NP   400 mg at 01/21/18 1634  . levalbuterol (XOPENEX) nebulizer solution 0.63 mg  0.63 mg Nebulization Q4H PRN Rankin, Shuvon B, NP      . mometasone-formoterol (DULERA) 100-5 MCG/ACT inhaler 2 puff  2 puff Inhalation BID Rankin, Shuvon B, NP   2 puff at 01/22/18 0752  . multivitamin with minerals tablet 1 tablet  1 tablet Oral Daily Rankin, Shuvon B, NP   1 tablet at 01/22/18 0754  . naltrexone (DEPADE) tablet 50 mg  50 mg Oral Daily Rankin, Shuvon B, NP      . nicotine (NICODERM CQ - dosed in mg/24 hours) patch 21 mg  21 mg Transdermal Daily Rankin, Shuvon B, NP   21 mg at 01/22/18 0753  . pantoprazole (PROTONIX) EC tablet 40 mg  40 mg Oral Daily Rankin, Shuvon B, NP   40 mg at 01/22/18 0754  . senna-docusate (Senokot-S) tablet 1 tablet  1 tablet Oral Daily PRN Rankin, Shuvon B, NP      . tiotropium (SPIRIVA) inhalation  capsule 18 mcg  18 mcg Inhalation Daily Rankin, Shuvon B, NP   18 mcg at 01/22/18 0803  . traMADol (ULTRAM) tablet 100 mg  100 mg Oral Q6H PRN Rankin, Shuvon B, NP   100 mg at 01/22/18 0753  . traZODone (DESYREL) tablet 100 mg  100 mg Oral QHS Rankin, Shuvon B, NP       PTA Medications: Medications Prior to Admission  Medication Sig Dispense Refill Last Dose  . albuterol-ipratropium (COMBIVENT) 18-103 MCG/ACT inhaler Inhale 1-2 puffs into the lungs every 4 (four) hours as needed for wheezing or shortness of breath. 1 Inhaler 0 Past Week at Unknown time  . citalopram (CELEXA) 40 MG tablet Take 1 tablet (40 mg total) by mouth daily. 30 tablet 0   . folic acid (FOLVITE) 1 MG tablet Take 1 tablet (1 mg total) by mouth daily.     Marland Kitchen gabapentin (NEURONTIN) 800 MG tablet Take 1 tablet (800 mg total) by mouth 3 (three) times daily. 30 tablet 0 Past Week at Unknown time  . hydrOXYzine (ATARAX/VISTARIL) 50 MG tablet Take 2 tablets (100 mg total) by mouth 3 (three) times daily as needed for anxiety. 30 tablet 0   . ibuprofen (ADVIL,MOTRIN) 400 MG tablet Take 1  tablet (400 mg total) by mouth every 6 (six) hours as needed for mild pain. 30 tablet 0   . mometasone-formoterol (DULERA) 100-5 MCG/ACT AERO Inhale 2 puffs into the lungs 2 (two) times daily. 13 g 0   . Multiple Vitamin (MULTIVITAMIN WITH MINERALS) TABS tablet Take 1 tablet by mouth daily.     . naltrexone (DEPADE) 50 MG tablet Take 1 tablet (50 mg total) by mouth daily.     . nicotine (NICODERM CQ - DOSED IN MG/24 HOURS) 21 mg/24hr patch Place 1 patch (21 mg total) onto the skin daily. 28 patch 0   . pantoprazole (PROTONIX) 40 MG tablet Take 1 tablet (40 mg total) by mouth daily. 30 tablet 0   . promethazine (PHENERGAN) 25 MG tablet Take 1 tablet (25 mg total) by mouth every 6 (six) hours as needed for nausea or vomiting. 30 tablet 0 Past Month at Unknown time  . senna-docusate (SENOKOT-S) 8.6-50 MG tablet Take 1 tablet by mouth daily as needed for  mild constipation.     . thiamine 100 MG tablet Take 1 tablet (100 mg total) by mouth daily.     Marland Kitchen tiotropium (SPIRIVA) 18 MCG inhalation capsule Place 1 capsule (18 mcg total) into inhaler and inhale daily. 30 capsule 12   . traMADol (ULTRAM) 50 MG tablet Take 2 tablets (100 mg total) by mouth every 6 (six) hours as needed for severe pain. 20 tablet 0   . traZODone (DESYREL) 100 MG tablet Take 100 mg by mouth at bedtime.   Past Week at Unknown time    Patient Stressors: Health problems Marital or family conflict Substance abuse  Patient Strengths: Ability for insight Average or above average intelligence Capable of independent living General fund of knowledge  Treatment Modalities: Medication Management, Group therapy, Case management,  1 to 1 session with clinician, Psychoeducation, Recreational therapy.   Physician Treatment Plan for Primary Diagnosis: Bipolar Disorder  Medication Management: Evaluate patient's response, side effects, and tolerance of medication regimen.  Therapeutic Interventions: 1 to 1 sessions, Unit Group sessions and Medication administration.  Evaluation of Outcomes: Not Met  Physician Treatment Plan for Secondary Diagnosis: Active Problems:   Bipolar affective disorder ([place])  Medication Management: Evaluate patient's response, side effects, and tolerance of medication regimen.  Therapeutic Interventions: 1 to 1 sessions, Unit Group sessions and Medication administration.  Evaluation of Outcomes: Not Met   RN Treatment Plan for Primary Diagnosis: Bipolar Disorder Long Term Goal(s): Knowledge of disease and therapeutic regimen to maintain health will improve  Short Term Goals: Ability to remain free from injury will improve, Ability to verbalize frustration and anger appropriately will improve, Ability to demonstrate self-control and Ability to disclose and discuss suicidal ideas  Medication Management: RN will administer medications as ordered by  provider, will assess and evaluate patient's response and provide education to patient for prescribed medication. RN will report any adverse and/or side effects to prescribing provider.  Therapeutic Interventions: 1 on 1 counseling sessions, Psychoeducation, Medication administration, Evaluate responses to treatment, Monitor vital signs and CBGs as ordered, Perform/monitor CIWA, COWS, AIMS and Fall Risk screenings as ordered, Perform wound care treatments as ordered.  Evaluation of Outcomes: Not Met   LCSW Treatment Plan for Primary Diagnosis: Bipolar Disorder Long Term Goal(s): Safe transition to appropriate next level of care at discharge, Engage patient in therapeutic group addressing interpersonal concerns.  Short Term Goals: Engage patient in aftercare planning with referrals and resources, Facilitate patient progression through stages of change regarding substance  use diagnoses and concerns and Identify triggers associated with mental health/substance abuse issues  Therapeutic Interventions: Assess for all discharge needs, 1 to 1 time with Social worker, Explore available resources and support systems, Assess for adequacy in community support network, Educate family and significant other(s) on suicide prevention, Complete Psychosocial Assessment, Interpersonal group therapy.  Evaluation of Outcomes: Not Met   Progress in Treatment: Attending groups: No. New to unit. Continuing to assess.   Participating in groups: No. Taking medication as prescribed: Yes. Toleration medication: Yes. Family/Significant other contact made: No, will contact:  family member if pt consents to collateral contact.  Patient understands diagnosis: Yes. Discussing patient identified problems/goals with staff: Yes. Medical problems stabilized or resolved: Yes. Denies suicidal/homicidal ideation: Yes. Issues/concerns per patient self-inventory: Yes. Other: n/a   New problem(s) identified: No, Describe:   n/a  New Short Term/Long Term Goal(s): detox, medication management for mood stabilization; elimination of SI thoughts; development of comprehensive mental wellness/sobriety plan.   Patient Goals:  "I want to get sober again and get medication for my depression."   Discharge Plan or Barriers: CSW assessing for appropriate referrals. [place] pamphlet, Mobile Crisis information, and AA/NA information provided to patient for additional community support and resources.   Reason for Continuation of Hospitalization: Anxiety Depression Medication Management Suicidal ideation  Estimated Length of Stay: Tuesday, 01/26/18  Attendees: Patient:  01/22/2018 8:36 AM  Physician:  Dr. Parke Poisson MD; Dr. Leverne Humbles MD 01/22/2018 8:36 AM  Nursing: Yetta Flock RN; Doroteo Bradford RN 01/22/2018 8:36 AM  RN Care Manager:x 01/22/2018 8:36 AM  Social Worker: Janice Norrie LCSW 01/22/2018 8:36 AM  Recreational Therapist: x 01/22/2018 8:36 AM  Other: Lindell Spar NP; Saul Fordyce NP 01/22/2018 8:36 AM  Other:  01/22/2018 8:36 AM  Other: 01/22/2018 8:36 AM    Scribe for Treatment Team: Avelina Laine, LCSW 01/22/2018 8:36 AM
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
## 2018-01-22 NOTE — [facility] Group Notes (Signed)
LCSW Group Therapy Note  01/22/2018 1:15pm  Type of Therapy and Topic:  Group Therapy:  Feelings around Relapse and Recovery  Participation Level:  Did Not Attend--pt invited. Chose to remain in bed.    Description of Group:    Patients in this group will discuss emotions they experience before and after a relapse. They will process how experiencing these feelings, or avoidance of experiencing them, relates to having a relapse. Facilitator will guide patients to explore emotions they have related to recovery. Patients will be encouraged to process which emotions are more powerful. They will be guided to discuss the emotional reaction significant others in their lives may have to their relapse or recovery. Patients will be assisted in exploring ways to respond to the emotions of others without this contributing to a relapse.  Therapeutic Goals: 1. Patient will identify two or more emotions that lead to a relapse for them 2. Patient will identify two emotions that result when they relapse 3. Patient will identify two emotions related to recovery 4. Patient will demonstrate ability to communicate their needs through discussion and/or role plays   Summary of Patient Progress:  x   Therapeutic Modalities:   Cognitive Behavioral Therapy Solution-Focused Therapy Assertiveness Training Relapse Prevention Therapy   Charlotte RavensHeather S Geisha Abernathy, LCSW 01/22/2018 11:36 AM

## 2018-01-22 NOTE — Progress Notes (Signed)
Patient self inventory- Patient slept poorly last night, sleep medication was not requested. Appetite has been fair, concentration poor. Patient endorses passive SI but contracts for safety while on the unit. Denies physical problems. Patient's goal is "to work on myself."   Patient has gotten out of bed once for morning medications, but said she felt so weak she couldn't stand. Patient has been in bed all day but is extremely med seeking and asks the nurses to bring everything to her. Patient complains of pain in her left leg. Medication given. Patient states she feels weak and like she has flu-like symptoms, but presents as normal (including temp). Vital signs are within normal range. Patient has asked for a potassium supplement to help her feel better although levels are  within normal range. Multivitamin suggested as an alternative. Zofran ordered PRN. MD notified. Safety maintained with 15 minute checks. Will continue to monitor.
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
## 2018-01-22 NOTE — Plan of Care (Signed)
  Problem: Education: Goal: Knowledge of [place] [facility] Education information/materials will improve Outcome: Progressing   Problem: Activity: Goal: Sleeping patterns will improve Outcome: Progressing   Problem: Safety: Goal: Periods of time without injury will increase Outcome: Progressing   Problem: Coping: Goal: Level of anxiety will decrease Outcome: Not Progressing   Problem: Health Behavior/Discharge Planning: Goal: Ability to manage health-related needs will improve Outcome: Not Progressing   Problem: Self-Concept: Goal: Will verbalize positive feelings about self Outcome: Not Progressing

## 2018-01-23 ENCOUNTER — Encounter (HOSPITAL_COMMUNITY): Payer: Self-pay | Admitting: Behavioral Health

## 2018-01-23 DIAGNOSIS — F17210 Nicotine dependence, cigarettes, uncomplicated: Secondary | ICD-10-CM

## 2018-01-23 DIAGNOSIS — F3131 Bipolar disorder, current episode depressed, mild: Secondary | ICD-10-CM

## 2018-01-23 DIAGNOSIS — G4700 Insomnia, unspecified: Secondary | ICD-10-CM

## 2018-01-23 DIAGNOSIS — F1910 Other psychoactive substance abuse, uncomplicated: Secondary | ICD-10-CM

## 2018-01-23 DIAGNOSIS — F419 Anxiety disorder, unspecified: Secondary | ICD-10-CM

## 2018-01-23 DIAGNOSIS — Z818 Family history of other mental and behavioral disorders: Secondary | ICD-10-CM

## 2018-01-23 LAB — RAPID URINE DRUG SCREEN, HOSP PERFORMED
Amphetamines: NOT DETECTED
BENZODIAZEPINES: NOT DETECTED
Barbiturates: POSITIVE — AB
Cocaine: NOT DETECTED
Opiates: NOT DETECTED
TETRAHYDROCANNABINOL: NOT DETECTED
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
## 2018-01-23 NOTE — Plan of Care (Signed)
D: Pt denies SI/HI/AVH. Pt is pleasant and cooperative. Pt was less irritable, pt stated " not dizzy tonight". Pt had brighter affect and pr more talkative to Clinical research associatewriter. Pt stated she may need something to put on her legs , so pt encouraged to talk with the doctor. Pt stated she did not like the smell of Ben-gay, and pt was informed that everything does not have that menthol smell.   A: Pt was offered support and encouragement. Pt was given scheduled medications. Pt was encourage to attend groups. Q 15 minute checks were done for safety.   R:Pt attends groups and interacts well with peers and staff. Pt is taking medication. Pt has no complaints.Pt receptive to treatment and safety maintained on unit.   Problem: Education: Goal: Emotional status will improve Outcome: Progressing   Problem: Education: Goal: Mental status will improve Outcome: Progressing   Problem: Activity: Goal: Sleeping patterns will improve Outcome: Progressing   Problem: Coping: Goal: Ability to demonstrate self-control will improve Outcome: Progressing   Problem: Health Behavior/Discharge Planning: Goal: Compliance with treatment plan for underlying cause of condition will improve Outcome: Progressing   Problem: Safety: Goal: Periods of time without injury will increase Outcome: Progressing   Problem: Activity: Goal: Interest or engagement in leisure activities will improve Outcome: Progressing
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
## 2018-01-24 DIAGNOSIS — F10230 Alcohol dependence with withdrawal, uncomplicated: Secondary | ICD-10-CM

## 2018-01-24 MED ORDER — HYDROXYZINE HCL 50 MG PO TABS
50.0000 mg | ORAL_TABLET | Freq: Four times a day (QID) | ORAL | Status: DC | PRN
  Administered 2018-01-24 – 2018-01-26 (×5): 50 mg via ORAL
  Filled 2018-01-24 (×5): qty 1

## 2018-01-24 MED ORDER — TRAMADOL HCL 50 MG PO TABS
50.0000 mg | ORAL_TABLET | Freq: Three times a day (TID) | ORAL | Status: DC | PRN
  Administered 2018-01-24 – 2018-01-27 (×6): 50 mg via ORAL
  Filled 2018-01-24 (×6): qty 1

## 2018-01-24 MED ORDER — TRAMADOL HCL 50 MG PO TABS: 50.0000 mg | ORAL_TABLET | Freq: Four times a day (QID) | ORAL | Status: DC | PRN

## 2018-01-24 MED ORDER — TRAZODONE HCL 100 MG PO TABS
100.0000 mg | ORAL_TABLET | Freq: Every evening | ORAL | Status: DC | PRN
  Administered 2018-01-24 – 2018-01-26 (×3): 100 mg via ORAL
  Filled 2018-01-24 (×2): qty 1

## 2018-01-24 NOTE — [facility] Group Notes (Signed)
[facility] LCSW Group Therapy Note  Date/Time:  01/24/2018 9:00-10:00 or 10:00-11:00AM  Type of Therapy and Topic:  Group Therapy:  Healthy and Unhealthy Supports  Participation Level:  Did Not Attend   Description of Group:  Patients in this group were introduced to the idea of adding a variety of healthy supports to address the various needs in their lives.Patients discussed what additional healthy supports could be helpful in their recovery and wellness after discharge in order to prevent future hospitalizations.   An emphasis was placed on using counselor, doctor, therapy groups, 12-step groups, and problem-specific support groups to expand supports.  They also worked as a group on developing a specific plan for several patients to deal with unhealthy supports through boundary-setting, psychoeducation with loved ones, and even termination of relationships.   Therapeutic Goals:   1)  discuss importance of adding supports to stay well once out of the hospital  2)  compare healthy versus unhealthy supports and identify some examples of each  3)  generate ideas and descriptions of healthy supports that can be added  4)  offer mutual support about how to address unhealthy supports  5)  encourage active participation in and adherence to discharge plan    Summary of Patient Progress:   N/A  Therapeutic Modalities:   Motivational Interviewing Brief Solution-Focused Therapy  Charmian Forbis D Fredrica Capano         

## 2018-01-24 NOTE — Progress Notes (Signed)
Adult Psychoeducational Group Note  Date:  01/24/2018 Time: 1600   Group Topic/Focus:  Coping With Mental Health Crisis:   The purpose of this group is to help patients identify strategies for coping with mental health crisis.  Group discusses possible causes of crisis and ways to manage them effectively.  Participation Level: Did not attend.

## 2018-01-24 NOTE — Progress Notes (Signed)
Pt presents with a flat affect and depressed/anxious mood. Pt rated on her self inventory sheet: depression 3/10, anxiety 4/10 and hopelessness 3/10. Pt denies SI/HI. Pt reports good sleep at bedtime last night. Pt reports withdrawal symptoms of cravings, cramping, and nausea. Pt c/o chronic pain to her left leg and requested Ultram. Pt then requested Zofran and Vistaril for nausea and anxiety. Pt stated goal for today, "attend groups and talk to doctor".   Medications and orders reviewed with pt. Verbal support provided. Pt encouraged to attend groups. 15 minute checks performed for safety.  Pt compliant with tx plan
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
## 2018-01-24 NOTE — Plan of Care (Signed)
  Problem: Safety: Goal: Periods of time without injury will increase Outcome: Progressing   Problem: Coping: Goal: Will verbalize feelings Outcome: Progressing   

## 2018-01-24 NOTE — Plan of Care (Signed)
D: Pt denies SI/HI/AVH. Pt is pleasant and cooperative. Pt upset her pain medication was decreased, pt visible in dayroom this evening.  A: Pt was offered support and encouragement. Pt was given scheduled medications. Pt was encourage to attend groups. Q 15 minute checks were done for safety.   R:Pt attends groups and interacts well with peers and staff. Pt is taking medication. Pt has no complaints.Pt receptive to treatment and safety maintained on unit.   Problem: Safety: Goal: Periods of time without injury will increase Outcome: Progressing   Problem: Activity: Goal: Interest or engagement in leisure activities will improve Outcome: Progressing   Problem: Coping: Goal: Coping ability will improve Outcome: Progressing   Problem: Coping: Goal: Will verbalize feelings Outcome: Progressing   Problem: Role Relationship: Goal: Will demonstrate positive changes in social behaviors and relationships Outcome: Progressing

## 2018-01-25 DIAGNOSIS — F3132 Bipolar disorder, current episode depressed, moderate: Principal | ICD-10-CM

## 2018-01-25 MED ORDER — IBUPROFEN 600 MG PO TABS
600.0000 mg | ORAL_TABLET | Freq: Four times a day (QID) | ORAL | Status: DC | PRN
  Administered 2018-01-25 – 2018-01-26 (×3): 600 mg via ORAL
  Filled 2018-01-25 (×3): qty 1
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
## 2018-01-25 NOTE — Plan of Care (Signed)
D: Pt denies SI/HI/AVH. Pt is pleasant and cooperative. Pt stated she was better, " I did more today, I wasn't dizzy , I was out more".  A: Pt was offered support and encouragement. Pt was given scheduled medications. Pt was encourage to attend groups. Q 15 minute checks were done for safety.   R:Pt attends groups and interacts well with peers and staff. Pt is taking medication. Pt has no complaints.Pt receptive to treatment and safety maintained on unit.   Problem: Education: Goal: Emotional status will improve Outcome: Progressing   Problem: Education: Goal: Mental status will improve Outcome: Progressing   Problem: Activity: Goal: Sleeping patterns will improve Outcome: Progressing   Problem: Safety: Goal: Periods of time without injury will increase Outcome: Progressing   Problem: Coping: Goal: Coping ability will improve Outcome: Progressing   Problem: Self-Concept: Goal: Level of anxiety will decrease Outcome: Progressing

## 2018-01-25 NOTE — Plan of Care (Signed)
Pt progressing in the following metrics  D: pt found in her bedroom, compliant with medication administration. Pt states she slept "ok". Pt rates her pain in the left leg at a 10/10. Pt denies any si/hi/ah/vh and verbally agrees to approach staff if these become apparent. Pt rates her depression/hopelessness/anxiety a 6/5/6 out of 10 respectively. Pt states her goal for today is to find out where she is going after she leaves. Pt will achieve this by calling [facility] today.  A: pt provided support and encouragement. Pt provided medications per protocol and standing orders. [id number] safety checks implemented and continued.  R: pt safe on the unit. Will continue to monitor.  Problem: Education: Goal: Knowledge of [facility] Education information/materials will improve Outcome: Progressing Goal: Emotional status will improve Outcome: Progressing Goal: Mental status will improve Outcome: Progressing   Problem: Activity: Goal: Interest or engagement in activities will improve Outcome: Progressing   Problem: Coping: Goal: Ability to verbalize frustrations and anger appropriately will improve Outcome: Progressing   Problem: Health Behavior/Discharge Planning: Goal: Compliance with treatment plan for underlying cause of condition will improve Outcome: Progressing   Problem: Safety: Goal: Periods of time without injury will increase Outcome: Progressing   Problem: Education: Goal: Knowledge of the prescribed therapeutic regimen will improve Outcome: Progressing   Problem: Activity: Goal: Interest or engagement in leisure activities will improve Outcome: Progressing Goal: Imbalance in normal sleep/wake cycle will improve Outcome: Progressing   Problem: Coping: Goal: Coping ability will improve Outcome: Progressing   Problem: Health Behavior/Discharge Planning: Goal: Compliance with therapeutic regimen will improve Outcome: Progressing   Problem: Safety: Goal: Ability to  disclose and discuss suicidal ideas will improve Outcome: Progressing   Problem: Self-Concept: Goal: Will verbalize positive feelings about self Outcome: Progressing Goal: Level of anxiety will decrease Outcome: Progressing   Problem: Medication: Goal: Compliance with prescribed medication regimen will improve Outcome: Progressing   Problem: Self-Concept: Goal: Ability to disclose and discuss suicidal ideas will improve Outcome: Progressing Goal: Will verbalize positive feelings about self Outcome: Progressing   Problem: Coping: Goal: Level of anxiety will decrease Outcome: Progressing   Problem: Safety: Goal: Ability to remain free from injury will improve Outcome: Progressing

## 2018-01-26 ENCOUNTER — Encounter (HOSPITAL_COMMUNITY): Payer: Self-pay | Admitting: Behavioral Health

## 2018-01-26 MED ORDER — NALTREXONE HCL 50 MG PO TABS: 50.0000 mg | ORAL_TABLET | Freq: Every day | ORAL | 0 refills | Status: DC

## 2018-01-26 MED ORDER — FOLIC ACID 1 MG PO TABS: 1.0000 mg | ORAL_TABLET | Freq: Every day | ORAL | 0 refills | Status: DC

## 2018-01-26 MED ORDER — TRAZODONE HCL 100 MG PO TABS: 100.0000 mg | ORAL_TABLET | Freq: Every evening | ORAL | 0 refills | Status: DC | PRN

## 2018-01-26 MED ORDER — IBUPROFEN 600 MG PO TABS: 600.0000 mg | ORAL_TABLET | Freq: Four times a day (QID) | ORAL | 0 refills | Status: DC | PRN

## 2018-01-26 MED ORDER — PANTOPRAZOLE SODIUM 40 MG PO TBEC: 40.0000 mg | DELAYED_RELEASE_TABLET | Freq: Every day | ORAL | 0 refills | Status: DC

## 2018-01-26 MED ORDER — TIOTROPIUM BROMIDE MONOHYDRATE 18 MCG IN CAPS: 18.0000 ug | ORAL_CAPSULE | Freq: Every day | RESPIRATORY_TRACT | 0 refills | Status: DC

## 2018-01-26 MED ORDER — CITALOPRAM HYDROBROMIDE 20 MG PO TABS: 20.0000 mg | ORAL_TABLET | Freq: Every day | ORAL | 0 refills | Status: DC

## 2018-01-26 MED ORDER — LEVALBUTEROL HCL 0.63 MG/3ML IN NEBU: 0.6300 mg | INHALATION_SOLUTION | RESPIRATORY_TRACT | 0 refills | Status: DC | PRN

## 2018-01-26 MED ORDER — HYDROXYZINE HCL 50 MG PO TABS: 50.0000 mg | ORAL_TABLET | Freq: Four times a day (QID) | ORAL | 0 refills | Status: DC | PRN

## 2018-01-26 MED ORDER — SENNOSIDES-DOCUSATE SODIUM 8.6-50 MG PO TABS: 1.0000 | ORAL_TABLET | Freq: Every day | ORAL | Status: DC | PRN

## 2018-01-26 MED ORDER — MOMETASONE FURO-FORMOTEROL FUM 100-5 MCG/ACT IN AERO: 2.0000 | INHALATION_SPRAY | Freq: Two times a day (BID) | RESPIRATORY_TRACT | Status: DC

## 2018-01-26 MED ORDER — NICOTINE 21 MG/24HR TD PT24: 21.0000 mg | MEDICATED_PATCH | Freq: Every day | TRANSDERMAL | 0 refills | Status: DC

## 2018-01-26 MED ORDER — ALBUTEROL SULFATE HFA 108 (90 BASE) MCG/ACT IN AERS: 1.0000 | INHALATION_SPRAY | RESPIRATORY_TRACT | Status: DC | PRN

## 2018-01-26 MED ORDER — GABAPENTIN 400 MG PO CAPS: 800.0000 mg | ORAL_CAPSULE | Freq: Three times a day (TID) | ORAL | 0 refills | Status: DC

## 2018-01-26 MED ORDER — COMBIVENT 18-103 MCG/ACT IN AERO: 1.0000 | INHALATION_SPRAY | RESPIRATORY_TRACT | 0 refills | Status: DC | PRN

## 2018-01-26 MED ORDER — TRAMADOL HCL 50 MG PO TABS: 50.0000 mg | ORAL_TABLET | Freq: Three times a day (TID) | ORAL | 0 refills | Status: DC | PRN

## 2018-01-26 NOTE — Progress Notes (Signed)
Recreation Therapy Notes  Animal-Assisted Activity (AAA) Program Checklist/Progress Notes Patient Eligibility Criteria Checklist & Daily Group note for Rec Tx Intervention  Date: 7.30.19 Time: 1430 [address]   AAA/T Program Assumption of Risk Form signed by Engineer, productionatient/ or Parent Legal Guardian YES   Patient is free of allergies or sever asthma YES  Patient reports no fear of animals  YES  Patient reports no history of cruelty to animals  YES  Patient understands his/her participation is voluntary  YES   Patient washes hands before animal contact  YES   Patient washes hands after animal contact  YES   Education: Charity fundraiserHand Washing, Appropriate Animal Interaction   Education Outcome: Acknowledges understanding/In group clarification offered/Needs additional education.   Clinical Observations/Feedback: Pt did not attend group.    Caroll RancherMarjette Ipek Westra, LRT/CTRS         Caroll RancherLindsay, Burel Kahre A 01/26/2018 3:58 PM
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
## 2018-01-26 NOTE — Progress Notes (Addendum)
  [facility]  Will you be returning to the same living situation after discharge:  Yes,  home At discharge, do you have transportation home?: Yes,  bus pass provided--DISCHARGE RESCHEDULED FOR WED 7/31 Do you have the ability to pay for your medications: Yes,  [facility]  Release of information consent forms completed and submitted to medical records by CSW.  Patient to Follow up at: Follow-up Information    [facility] Follow up on 02/01/2018.   Specialty:  Behavioral Health Why:  Hospital follow-up on Monday, 8/5 at 8:00AM. Please bring: photo ID and medicaid card. Thank you.  Contact information: [address] [place] [address] [phone]           Next level of care provider [facility]:no  Safety Planning and Suicide Prevention discussed: Yes,  SPE completed with pt; pt declined to consent to collateral contact.   Have you used any form of tobacco in the last 30 days? (Cigarettes, Smokeless Tobacco, Cigars, and/or Pipes): Yes  Has patient been referred to the Quitline?: Patient refused referral  Patient has been referred for addiction treatment: Yes  Rona RavensHeather S Jered Heiny, LCSW 01/26/2018, 9:49 AM

## 2018-01-26 NOTE — Plan of Care (Signed)
Pt progressing in the following metrics  D: pt found in bed. Pt compliant with medication administration. Pt denies any si/hi/ah/vh and verbally agrees to approach staff if these become apparent. Patient consistently rates her leg pain at an 8-10 out of 10. Pt is leaving tomorrow. Pt was going to be discharged today but had nowhere to go. Pt stated she didn't have any money until Thursday morning. Pt expected to be discharged on 7/31.  A: Pt provided medications per protocol and standing orders. Pt provided support and encouragement. [id number] safety checks implemented and continued. R: pt safe on the unit. Will continue to monitor.   Problem: Education: Goal: Emotional status will improve Outcome: Progressing   Problem: Activity: Goal: Interest or engagement in activities will improve Outcome: Progressing   Problem: Coping: Goal: Ability to verbalize frustrations and anger appropriately will improve Outcome: Progressing   Problem: Physical Regulation: Goal: Ability to maintain clinical measurements within normal limits will improve Outcome: Progressing   Problem: Safety: Goal: Periods of time without injury will increase Outcome: Progressing   Problem: Education: Goal: Knowledge of the prescribed therapeutic regimen will improve Outcome: Progressing   Problem: Activity: Goal: Imbalance in normal sleep/wake cycle will improve Outcome: Progressing   Problem: Safety: Goal: Ability to disclose and discuss suicidal ideas will improve Outcome: Progressing   Problem: Health Behavior/Discharge Planning: Goal: Identification of resources available to assist in meeting health care needs will improve Outcome: Progressing   Problem: Medication: Goal: Compliance with prescribed medication regimen will improve Outcome: Progressing   Problem: Self-Concept: Goal: Ability to disclose and discuss suicidal ideas will improve Outcome: Progressing   Problem: Safety: Goal: Ability to remain  free from injury will improve Outcome: Progressing
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
## 2018-01-26 NOTE — Progress Notes (Signed)
Patient did not attend wrap up group. 

## 2018-01-27 ENCOUNTER — Encounter (HOSPITAL_COMMUNITY): Payer: Self-pay | Admitting: Behavioral Health

## 2018-01-27 MED ORDER — ONDANSETRON HCL 8 MG PO TABS
8.0000 mg | ORAL_TABLET | Freq: Once | ORAL | Status: AC
  Administered 2018-01-27: 8 mg via ORAL
  Filled 2018-01-27: qty 2
  Filled 2018-01-27: qty 1

## 2018-01-27 NOTE — Progress Notes (Signed)
Recreation Therapy Notes  Date: 7.31.19 Time: 0930 Location: [address] Dayroom  Group Topic: Stress Management  Goal Area(s) Addresses:  Patient will verbalize importance of using healthy stress management.  Patient will identify positive emotions associated with healthy stress management.   Intervention: Stress Management  Activity :  Guided Imagery.  LRT introduced the stress management technique of guided imagery.  LRT read a script about peaceful waves.  Patients were to follow along as LRT read script to relax and envision being on the beach.  Education:  Stress Management, Discharge Planning.   Education Outcome: Acknowledges edcuation/In group clarification offered/Needs additional education  Clinical Observations/Feedback:  Pt did not attend group.      Caroll RancherMarjette Allecia Bells, LRT/CTRS     [name], Eiko Mcgowen A 01/27/2018 10:36 AM

## 2018-01-27 NOTE — Plan of Care (Signed)
Patient verbalizes readiness for discharge. Follow up plan explained, AVS, Transition record and SRA given. Prescriptions and teaching provided. Belongings returned and signed for. Suicide safety plan completed and signed. Patient verbalizes understanding. Patient denies SI/HI and assures this Clinical research associatewriter he will seek assistance should that change. Patient discharged to lobby with case manager.  Problem: Education: Goal: Knowledge of [facility] Education information/materials will improve Outcome: Adequate for Discharge Goal: Emotional status will improve Outcome: Adequate for Discharge Goal: Mental status will improve Outcome: Adequate for Discharge Goal: Verbalization of understanding the information provided will improve Outcome: Adequate for Discharge   Problem: Activity: Goal: Interest or engagement in activities will improve Outcome: Adequate for Discharge Goal: Sleeping patterns will improve Outcome: Adequate for Discharge   Problem: Coping: Goal: Ability to verbalize frustrations and anger appropriately will improve Outcome: Adequate for Discharge Goal: Ability to demonstrate self-control will improve Outcome: Adequate for Discharge   Problem: Health Behavior/Discharge Planning: Goal: Identification of resources available to assist in meeting health care needs will improve Outcome: Adequate for Discharge Goal: Compliance with treatment plan for underlying cause of condition will improve Outcome: Adequate for Discharge   Problem: Physical Regulation: Goal: Ability to maintain clinical measurements within normal limits will improve Outcome: Adequate for Discharge   Problem: Safety: Goal: Periods of time without injury will increase Outcome: Adequate for Discharge   Problem: Education: Goal: Utilization of techniques to improve thought processes will improve Outcome: Adequate for Discharge Goal: Knowledge of the prescribed therapeutic regimen will improve Outcome:  Adequate for Discharge   Problem: Activity: Goal: Interest or engagement in leisure activities will improve Outcome: Adequate for Discharge Goal: Imbalance in normal sleep/wake cycle will improve Outcome: Adequate for Discharge   Problem: Coping: Goal: Coping ability will improve Outcome: Adequate for Discharge Goal: Will verbalize feelings Outcome: Adequate for Discharge   Problem: Health Behavior/Discharge Planning: Goal: Ability to make decisions will improve Outcome: Adequate for Discharge Goal: Compliance with therapeutic regimen will improve Outcome: Adequate for Discharge   Problem: Role Relationship: Goal: Will demonstrate positive changes in social behaviors and relationships Outcome: Adequate for Discharge   Problem: Safety: Goal: Ability to disclose and discuss suicidal ideas will improve Outcome: Adequate for Discharge Goal: Ability to identify and utilize support systems that promote safety will improve Outcome: Adequate for Discharge   Problem: Self-Concept: Goal: Will verbalize positive feelings about self Outcome: Adequate for Discharge Goal: Level of anxiety will decrease Outcome: Adequate for Discharge   Problem: Education: Goal: Ability to make informed decisions regarding treatment will improve Outcome: Adequate for Discharge   Problem: Coping: Goal: Coping ability will improve Outcome: Adequate for Discharge   Problem: Health Behavior/Discharge Planning: Goal: Identification of resources available to assist in meeting health care needs will improve Outcome: Adequate for Discharge   Problem: Medication: Goal: Compliance with prescribed medication regimen will improve Outcome: Adequate for Discharge   Problem: Self-Concept: Goal: Ability to disclose and discuss suicidal ideas will improve Outcome: Adequate for Discharge Goal: Will verbalize positive feelings about self Outcome: Adequate for Discharge   Problem: Education: Goal: Knowledge  of General Education information will improve Description Including pain rating scale, medication(s)/side effects and non-pharmacologic comfort measures Outcome: Adequate for Discharge   Problem: Health Behavior/Discharge Planning: Goal: Ability to manage health-related needs will improve Outcome: Adequate for Discharge   Problem: Coping: Goal: Level of anxiety will decrease Outcome: Adequate for Discharge   Problem: Safety: Goal: Ability to remain free from injury will improve Outcome: Adequate for Discharge
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
## 2018-04-03 ENCOUNTER — Inpatient Hospital Stay (HOSPITAL_COMMUNITY)
Admission: EM | Admit: 2018-04-03 | Discharge: 2018-04-07 | DRG: 189 | Disposition: A | Discharge status: Home / Self Care | Payer: Medicaid Other | Attending: Internal Medicine | Admitting: Internal Medicine

## 2018-04-03 DIAGNOSIS — F418 Other specified anxiety disorders: Secondary | ICD-10-CM | POA: Diagnosis present

## 2018-04-03 DIAGNOSIS — F4310 Post-traumatic stress disorder, unspecified: Secondary | ICD-10-CM | POA: Diagnosis present

## 2018-04-03 DIAGNOSIS — N182 Chronic kidney disease, stage 2 (mild): Secondary | ICD-10-CM | POA: Diagnosis present

## 2018-04-03 DIAGNOSIS — Z791 Long term (current) use of non-steroidal anti-inflammatories (NSAID): Secondary | ICD-10-CM

## 2018-04-03 DIAGNOSIS — F319 Bipolar disorder, unspecified: Secondary | ICD-10-CM | POA: Diagnosis present

## 2018-04-03 DIAGNOSIS — Z590 Homelessness: Secondary | ICD-10-CM

## 2018-04-03 DIAGNOSIS — Z8619 Personal history of other infectious and parasitic diseases: Secondary | ICD-10-CM

## 2018-04-03 DIAGNOSIS — Z79899 Other long term (current) drug therapy: Secondary | ICD-10-CM

## 2018-04-03 DIAGNOSIS — F1910 Other psychoactive substance abuse, uncomplicated: Secondary | ICD-10-CM | POA: Diagnosis present

## 2018-04-03 DIAGNOSIS — I129 Hypertensive chronic kidney disease with stage 1 through stage 4 chronic kidney disease, or unspecified chronic kidney disease: Secondary | ICD-10-CM | POA: Diagnosis present

## 2018-04-03 DIAGNOSIS — J9621 Acute and chronic respiratory failure with hypoxia: Principal | ICD-10-CM | POA: Diagnosis present

## 2018-04-03 DIAGNOSIS — E876 Hypokalemia: Secondary | ICD-10-CM | POA: Diagnosis present

## 2018-04-03 DIAGNOSIS — F1010 Alcohol abuse, uncomplicated: Secondary | ICD-10-CM

## 2018-04-03 DIAGNOSIS — J441 Chronic obstructive pulmonary disease with (acute) exacerbation: Secondary | ICD-10-CM | POA: Diagnosis present

## 2018-04-03 DIAGNOSIS — F1024 Alcohol dependence with alcohol-induced mood disorder: Secondary | ICD-10-CM | POA: Diagnosis present

## 2018-04-03 DIAGNOSIS — F410 Panic disorder [episodic paroxysmal anxiety] without agoraphobia: Secondary | ICD-10-CM | POA: Diagnosis present

## 2018-04-03 DIAGNOSIS — F1920 Other psychoactive substance dependence, uncomplicated: Secondary | ICD-10-CM | POA: Diagnosis present

## 2018-04-03 DIAGNOSIS — F1410 Cocaine abuse, uncomplicated: Secondary | ICD-10-CM | POA: Diagnosis present

## 2018-04-03 DIAGNOSIS — F17210 Nicotine dependence, cigarettes, uncomplicated: Secondary | ICD-10-CM | POA: Diagnosis present

## 2018-04-03 DIAGNOSIS — Z91411 Personal history of adult psychological abuse: Secondary | ICD-10-CM

## 2018-04-03 DIAGNOSIS — R45851 Suicidal ideations: Secondary | ICD-10-CM | POA: Diagnosis present

## 2018-04-03 DIAGNOSIS — F1110 Opioid abuse, uncomplicated: Secondary | ICD-10-CM | POA: Diagnosis present

## 2018-04-03 DIAGNOSIS — Z79891 Long term (current) use of opiate analgesic: Secondary | ICD-10-CM

## 2018-04-03 DIAGNOSIS — Z915 Personal history of self-harm: Secondary | ICD-10-CM

## 2018-04-03 DIAGNOSIS — Z7951 Long term (current) use of inhaled steroids: Secondary | ICD-10-CM

## 2018-04-03 DIAGNOSIS — Z9889 Other specified postprocedural states: Secondary | ICD-10-CM

## 2018-04-03 DIAGNOSIS — Z91410 Personal history of adult physical and sexual abuse: Secondary | ICD-10-CM

## 2018-04-03 DIAGNOSIS — Z823 Family history of stroke: Secondary | ICD-10-CM

## 2018-04-03 DIAGNOSIS — Z9981 Dependence on supplemental oxygen: Secondary | ICD-10-CM

## 2018-04-03 NOTE — ED Triage Notes (Signed)
Per EMS pt coming from home complaining of difficulty breathing for months and productive cough with green sputum for 2-3 weeks.  She has been drinking for 6 days and reports taking cocaine today.  EMS administered a nebulizer with 10 mg of albuterol and 0.5 mg of Atrovent.

## 2018-04-03 NOTE — ED Notes (Signed)
Bed: RESA Expected date:  Expected time:  Means of arrival:  Comments: 89 F SOB hx COPD

## 2018-04-04 ENCOUNTER — Encounter (HOSPITAL_COMMUNITY): Payer: Self-pay | Admitting: Emergency Medicine

## 2018-04-04 ENCOUNTER — Other Ambulatory Visit: Payer: Self-pay

## 2018-04-04 ENCOUNTER — Emergency Department (HOSPITAL_COMMUNITY): Payer: Medicaid Other

## 2018-04-04 DIAGNOSIS — F319 Bipolar disorder, unspecified: Secondary | ICD-10-CM | POA: Diagnosis present

## 2018-04-04 DIAGNOSIS — J441 Chronic obstructive pulmonary disease with (acute) exacerbation: Secondary | ICD-10-CM | POA: Diagnosis present

## 2018-04-04 DIAGNOSIS — N182 Chronic kidney disease, stage 2 (mild): Secondary | ICD-10-CM | POA: Diagnosis present

## 2018-04-04 DIAGNOSIS — R0602 Shortness of breath: Secondary | ICD-10-CM | POA: Diagnosis not present

## 2018-04-04 DIAGNOSIS — F410 Panic disorder [episodic paroxysmal anxiety] without agoraphobia: Secondary | ICD-10-CM | POA: Diagnosis present

## 2018-04-04 DIAGNOSIS — Z7951 Long term (current) use of inhaled steroids: Secondary | ICD-10-CM | POA: Diagnosis not present

## 2018-04-04 DIAGNOSIS — F17210 Nicotine dependence, cigarettes, uncomplicated: Secondary | ICD-10-CM | POA: Diagnosis present

## 2018-04-04 DIAGNOSIS — I129 Hypertensive chronic kidney disease with stage 1 through stage 4 chronic kidney disease, or unspecified chronic kidney disease: Secondary | ICD-10-CM | POA: Diagnosis present

## 2018-04-04 DIAGNOSIS — F419 Anxiety disorder, unspecified: Secondary | ICD-10-CM | POA: Diagnosis not present

## 2018-04-04 DIAGNOSIS — J9621 Acute and chronic respiratory failure with hypoxia: Secondary | ICD-10-CM | POA: Diagnosis present

## 2018-04-04 DIAGNOSIS — E876 Hypokalemia: Secondary | ICD-10-CM | POA: Diagnosis present

## 2018-04-04 DIAGNOSIS — Z590 Homelessness: Secondary | ICD-10-CM | POA: Diagnosis not present

## 2018-04-04 DIAGNOSIS — F1920 Other psychoactive substance dependence, uncomplicated: Secondary | ICD-10-CM | POA: Diagnosis not present

## 2018-04-04 DIAGNOSIS — F418 Other specified anxiety disorders: Secondary | ICD-10-CM | POA: Diagnosis present

## 2018-04-04 DIAGNOSIS — Z79891 Long term (current) use of opiate analgesic: Secondary | ICD-10-CM | POA: Diagnosis not present

## 2018-04-04 DIAGNOSIS — Z9889 Other specified postprocedural states: Secondary | ICD-10-CM | POA: Diagnosis not present

## 2018-04-04 DIAGNOSIS — R45851 Suicidal ideations: Secondary | ICD-10-CM | POA: Diagnosis present

## 2018-04-04 DIAGNOSIS — Z79899 Other long term (current) drug therapy: Secondary | ICD-10-CM | POA: Diagnosis not present

## 2018-04-04 DIAGNOSIS — Z8619 Personal history of other infectious and parasitic diseases: Secondary | ICD-10-CM | POA: Diagnosis not present

## 2018-04-04 DIAGNOSIS — G4700 Insomnia, unspecified: Secondary | ICD-10-CM | POA: Diagnosis not present

## 2018-04-04 DIAGNOSIS — Z9981 Dependence on supplemental oxygen: Secondary | ICD-10-CM | POA: Diagnosis not present

## 2018-04-04 DIAGNOSIS — F1110 Opioid abuse, uncomplicated: Secondary | ICD-10-CM | POA: Diagnosis present

## 2018-04-04 DIAGNOSIS — F4310 Post-traumatic stress disorder, unspecified: Secondary | ICD-10-CM | POA: Diagnosis present

## 2018-04-04 DIAGNOSIS — Z915 Personal history of self-harm: Secondary | ICD-10-CM | POA: Diagnosis not present

## 2018-04-04 DIAGNOSIS — Z823 Family history of stroke: Secondary | ICD-10-CM | POA: Diagnosis not present

## 2018-04-04 DIAGNOSIS — Z791 Long term (current) use of non-steroidal anti-inflammatories (NSAID): Secondary | ICD-10-CM | POA: Diagnosis not present

## 2018-04-04 DIAGNOSIS — F1410 Cocaine abuse, uncomplicated: Secondary | ICD-10-CM | POA: Diagnosis present

## 2018-04-04 DIAGNOSIS — F1910 Other psychoactive substance abuse, uncomplicated: Secondary | ICD-10-CM | POA: Diagnosis not present

## 2018-04-04 DIAGNOSIS — F1024 Alcohol dependence with alcohol-induced mood disorder: Secondary | ICD-10-CM | POA: Diagnosis present

## 2018-04-04 LAB — GLUCOSE, CAPILLARY
GLUCOSE-CAPILLARY: 173 mg/dL — AB (ref 70–99)
GLUCOSE-CAPILLARY: 136 mg/dL — AB (ref 70–99)
Glucose-Capillary: 186 mg/dL — ABNORMAL HIGH (ref 70–99)

## 2018-04-04 LAB — I-STAT CHEM 8, ED
CALCIUM ION: 1.05 mmol/L — AB (ref 1.15–1.40)
CHLORIDE: 104 mmol/L (ref 98–111)
CREATININE: 0.8 mg/dL (ref 0.44–1.00)
GLUCOSE: 128 mg/dL — AB (ref 70–99)
HCT: 40 % (ref 36.0–46.0)
Hemoglobin: 13.6 g/dL (ref 12.0–15.0)
Potassium: 3 mmol/L — ABNORMAL LOW (ref 3.5–5.1)
Sodium: 142 mmol/L (ref 135–145)
TCO2: 27 mmol/L (ref 22–32)

## 2018-04-04 LAB — CBC WITH DIFFERENTIAL/PLATELET
BASOS PCT: 0 %
Basophils Absolute: 0.1 10*3/uL (ref 0.0–0.1)
Eosinophils Absolute: 0.5 10*3/uL (ref 0.0–0.7)
Eosinophils Relative: 5 %
HCT: 42.1 % (ref 36.0–46.0)
HEMOGLOBIN: 14 g/dL (ref 12.0–15.0)
Lymphocytes Relative: 23 %
Lymphs Abs: 2.7 10*3/uL (ref 0.7–4.0)
MCH: 31 pg (ref 26.0–34.0)
MCHC: 33.3 g/dL (ref 30.0–36.0)
MCV: 93.1 fL (ref 78.0–100.0)
MONO ABS: 0.9 10*3/uL (ref 0.1–1.0)
MONOS PCT: 7 %
NEUTROS PCT: 65 %
Neutro Abs: 7.4 10*3/uL (ref 1.7–7.7)
PLATELETS: 257 10*3/uL (ref 150–400)
RBC: 4.52 MIL/uL (ref 3.87–5.11)
RDW: 16.7 % — ABNORMAL HIGH (ref 11.5–15.5)
WBC: 11.5 10*3/uL — ABNORMAL HIGH (ref 4.0–10.5)

## 2018-04-04 LAB — BASIC METABOLIC PANEL
Anion gap: 12 (ref 5–15)
CALCIUM: 8.9 mg/dL (ref 8.9–10.3)
CO2: 26 mmol/L (ref 22–32)
Chloride: 106 mmol/L (ref 98–111)
Creatinine, Ser: 0.91 mg/dL (ref 0.44–1.00)
GFR calc Af Amer: >60 mL/min (ref >60)
Glucose, Bld: 155 mg/dL — ABNORMAL HIGH (ref 70–99)
POTASSIUM: 3.9 mmol/L (ref 3.5–5.1)
SODIUM: 144 mmol/L (ref 135–145)

## 2018-04-04 LAB — RAPID URINE DRUG SCREEN, HOSP PERFORMED
AMPHETAMINES: NOT DETECTED
BENZODIAZEPINES: NOT DETECTED
Barbiturates: NOT DETECTED
COCAINE: POSITIVE — AB
Opiates: NOT DETECTED
TETRAHYDROCANNABINOL: NOT DETECTED

## 2018-04-04 LAB — TROPONIN I
Troponin I: <0.03 ng/mL (ref <0.03)
Troponin I: <0.03 ng/mL (ref <0.03)

## 2018-04-04 LAB — CBC
HEMATOCRIT: 42.1 % (ref 36.0–46.0)
Hemoglobin: 13.8 g/dL (ref 12.0–15.0)
MCH: 30.9 pg (ref 26.0–34.0)
MCHC: 32.8 g/dL (ref 30.0–36.0)
MCV: 94.2 fL (ref 78.0–100.0)
PLATELETS: 281 10*3/uL (ref 150–400)
RBC: 4.47 MIL/uL (ref 3.87–5.11)
RDW: 16.6 % — AB (ref 11.5–15.5)
WBC: 7.2 10*3/uL (ref 4.0–10.5)

## 2018-04-04 LAB — MAGNESIUM: Magnesium: 1.6 mg/dL — ABNORMAL LOW (ref 1.7–2.4)

## 2018-04-04 LAB — BRAIN NATRIURETIC PEPTIDE: B Natriuretic Peptide: 30.9 pg/mL (ref 0.0–100.0)

## 2018-04-04 LAB — I-STAT TROPONIN, ED: TROPONIN I, POC: 0.01 ng/mL (ref 0.00–0.08)

## 2018-04-04 LAB — ETHANOL: Alcohol, Ethyl (B): <10 mg/dL (ref <10)

## 2018-04-04 MED ORDER — LORAZEPAM 1 MG PO TABS
1.0000 mg | ORAL_TABLET | Freq: Four times a day (QID) | ORAL | Status: AC | PRN
  Administered 2018-04-04 – 2018-04-07 (×10): 1 mg via ORAL
  Filled 2018-04-04 (×10): qty 1

## 2018-04-04 MED ORDER — LEVALBUTEROL HCL 0.63 MG/3ML IN NEBU: 0.6300 mg | INHALATION_SOLUTION | RESPIRATORY_TRACT | Status: DC | PRN

## 2018-04-04 MED ORDER — GABAPENTIN 400 MG PO CAPS
800.0000 mg | ORAL_CAPSULE | Freq: Every day | ORAL | Status: DC
  Administered 2018-04-04 – 2018-04-07 (×17): 800 mg via ORAL
  Filled 2018-04-04 (×17): qty 2

## 2018-04-04 MED ORDER — FOLIC ACID 1 MG PO TABS
1.0000 mg | ORAL_TABLET | Freq: Every day | ORAL | Status: DC
  Administered 2018-04-04 – 2018-04-07 (×4): 1 mg via ORAL
  Filled 2018-04-04 (×4): qty 1

## 2018-04-04 MED ORDER — METHYLPREDNISOLONE SODIUM SUCC 125 MG IJ SOLR
125.0000 mg | Freq: Once | INTRAMUSCULAR | Status: AC
  Administered 2018-04-04: 125 mg via INTRAVENOUS
  Filled 2018-04-04: qty 2

## 2018-04-04 MED ORDER — DOXYCYCLINE HYCLATE 100 MG PO TABS
100.0000 mg | ORAL_TABLET | Freq: Two times a day (BID) | ORAL | Status: DC
  Administered 2018-04-04 – 2018-04-07 (×7): 100 mg via ORAL
  Filled 2018-04-04 (×7): qty 1

## 2018-04-04 MED ORDER — ONDANSETRON HCL 4 MG/2ML IJ SOLN
4.0000 mg | Freq: Once | INTRAMUSCULAR | Status: AC
  Administered 2018-04-04: 4 mg via INTRAVENOUS
  Filled 2018-04-04: qty 2

## 2018-04-04 MED ORDER — ALBUTEROL SULFATE (2.5 MG/3ML) 0.083% IN NEBU
5.0000 mg | INHALATION_SOLUTION | Freq: Once | RESPIRATORY_TRACT | Status: AC
  Administered 2018-04-04: 5 mg via RESPIRATORY_TRACT

## 2018-04-04 MED ORDER — VITAMIN B-1 100 MG PO TABS
100.0000 mg | ORAL_TABLET | Freq: Every day | ORAL | Status: DC
  Administered 2018-04-04 – 2018-04-07 (×4): 100 mg via ORAL
  Filled 2018-04-04 (×4): qty 1

## 2018-04-04 MED ORDER — CITALOPRAM HYDROBROMIDE 20 MG PO TABS
20.0000 mg | ORAL_TABLET | Freq: Every day | ORAL | Status: DC
  Administered 2018-04-04: 20 mg via ORAL
  Filled 2018-04-04: qty 1

## 2018-04-04 MED ORDER — ADULT MULTIVITAMIN W/MINERALS CH
1.0000 | ORAL_TABLET | Freq: Every day | ORAL | Status: DC
  Administered 2018-04-04 – 2018-04-07 (×4): 1 via ORAL
  Filled 2018-04-04 (×4): qty 1

## 2018-04-04 MED ORDER — TIOTROPIUM BROMIDE MONOHYDRATE 18 MCG IN CAPS
18.0000 ug | ORAL_CAPSULE | Freq: Every day | RESPIRATORY_TRACT | Status: DC
  Administered 2018-04-04 – 2018-04-07 (×4): 18 ug via RESPIRATORY_TRACT
  Filled 2018-04-04: qty 5

## 2018-04-04 MED ORDER — POTASSIUM CHLORIDE CRYS ER 20 MEQ PO TBCR
60.0000 meq | EXTENDED_RELEASE_TABLET | Freq: Once | ORAL | Status: AC
  Administered 2018-04-04: 60 meq via ORAL
  Filled 2018-04-04: qty 3

## 2018-04-04 MED ORDER — MAGNESIUM SULFATE 2 GM/50ML IV SOLN
2.0000 g | Freq: Once | INTRAVENOUS | Status: AC
  Administered 2018-04-04: 2 g via INTRAVENOUS
  Filled 2018-04-04: qty 50

## 2018-04-04 MED ORDER — LISINOPRIL 20 MG PO TABS
20.0000 mg | ORAL_TABLET | Freq: Every day | ORAL | Status: DC
  Administered 2018-04-04 – 2018-04-07 (×4): 20 mg via ORAL
  Filled 2018-04-04 (×4): qty 1

## 2018-04-04 MED ORDER — THIAMINE HCL 100 MG/ML IJ SOLN: 100.0000 mg | Freq: Every day | INTRAMUSCULAR | Status: DC

## 2018-04-04 MED ORDER — NICOTINE 21 MG/24HR TD PT24
21.0000 mg | MEDICATED_PATCH | Freq: Every day | TRANSDERMAL | Status: DC
  Administered 2018-04-04 – 2018-04-07 (×4): 21 mg via TRANSDERMAL
  Filled 2018-04-04 (×4): qty 1

## 2018-04-04 MED ORDER — PREDNISONE 20 MG PO TABS
40.0000 mg | ORAL_TABLET | Freq: Every day | ORAL | Status: DC
  Administered 2018-04-05 – 2018-04-07 (×3): 40 mg via ORAL
  Filled 2018-04-04 (×5): qty 2

## 2018-04-04 MED ORDER — HYDROXYZINE HCL 50 MG PO TABS
50.0000 mg | ORAL_TABLET | Freq: Four times a day (QID) | ORAL | Status: DC | PRN
  Administered 2018-04-04: 50 mg via ORAL
  Filled 2018-04-04: qty 1

## 2018-04-04 MED ORDER — MOMETASONE FURO-FORMOTEROL FUM 200-5 MCG/ACT IN AERO
2.0000 | INHALATION_SPRAY | Freq: Two times a day (BID) | RESPIRATORY_TRACT | Status: DC
  Administered 2018-04-04 – 2018-04-07 (×7): 2 via RESPIRATORY_TRACT
  Filled 2018-04-04: qty 8.8

## 2018-04-04 MED ORDER — METHYLPREDNISOLONE SODIUM SUCC 125 MG IJ SOLR
80.0000 mg | Freq: Four times a day (QID) | INTRAMUSCULAR | Status: AC
  Administered 2018-04-04 – 2018-04-05 (×4): 80 mg via INTRAVENOUS
  Filled 2018-04-04 (×4): qty 2

## 2018-04-04 MED ORDER — TRAZODONE HCL 100 MG PO TABS: 100.0000 mg | ORAL_TABLET | Freq: Every evening | ORAL | Status: DC | PRN

## 2018-04-04 MED ORDER — TRAMADOL HCL 50 MG PO TABS: 50.0000 mg | ORAL_TABLET | Freq: Three times a day (TID) | ORAL | Status: DC | PRN

## 2018-04-04 MED ORDER — ALBUTEROL SULFATE (2.5 MG/3ML) 0.083% IN NEBU
5.0000 mg | INHALATION_SOLUTION | Freq: Once | RESPIRATORY_TRACT | Status: DC
  Filled 2018-04-04: qty 6

## 2018-04-04 MED ORDER — LORAZEPAM 2 MG/ML IJ SOLN: 1.0000 mg | Freq: Four times a day (QID) | INTRAMUSCULAR | Status: AC | PRN

## 2018-04-04 MED ORDER — ALBUTEROL SULFATE (2.5 MG/3ML) 0.083% IN NEBU
INHALATION_SOLUTION | RESPIRATORY_TRACT | Status: AC
  Filled 2018-04-04: qty 6

## 2018-04-04 MED ORDER — ENOXAPARIN SODIUM 40 MG/0.4ML ~~LOC~~ SOLN
40.0000 mg | SUBCUTANEOUS | Status: DC
  Filled 2018-04-04 (×3): qty 0.4

## 2018-04-04 NOTE — ED Notes (Signed)
Pt requesting nausea medication

## 2018-04-04 NOTE — ED Notes (Addendum)
Pt will be transported when there is a sitter available. AC aware and will notify charge nurse when there is a sitter available.
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
## 2018-04-04 NOTE — Plan of Care (Signed)
  Problem: Clinical Measurements: Goal: Diagnostic test results will improve Outcome: Progressing Goal: Respiratory complications will improve Outcome: Progressing   Problem: Activity: Goal: Risk for activity intolerance will decrease Outcome: Progressing   Problem: Nutrition: Goal: Adequate nutrition will be maintained Outcome: Progressing   Problem: Safety: Goal: Ability to remain free from injury will improve Outcome: Progressing   

## 2018-04-04 NOTE — Progress Notes (Signed)
BIPAP held at this time. Patients oxygenation is within normal limits. Patient is talking in full sentences. MD aware. RT will continue to monitor.
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
## 2018-04-04 NOTE — ED Notes (Signed)
EKG given to EDP,Palumbo,MD., for review. 

## 2018-04-04 NOTE — Progress Notes (Signed)
Charlotte Fisher is a 52 y.o. female with medical history significant of COPD, polysubstance abuse including alcohol and cocaine, depression with anxiety, bipolar disorder with previous suicide attempts who presented to the ER with progressive shortness of breath and cough.  Patient continues to smoke cigarettes.  She was notably wheezing and becomes hypoxic in the ER.  She was evaluated and also found to have use cocaine as well as alcohol tonight.  Her shortness of breath started shortly after using the cocaine.  Shortness of breath has improved significantly.  Patient still has suicidal ideations.  Will await psychiatry input.  Prolonged QTc interval noted.  We will monitor and continue to correct abnormal electrolytes.  Will repeat EKG.  Will avoid medications that can prolong QTc interval whenever possible.  Further management will depend on hospital course.

## 2018-04-04 NOTE — ED Notes (Signed)
Per [facility], pt will be going to the floor at [facility]

## 2018-04-04 NOTE — ED Notes (Addendum)
Pt aware that urine sample is needed.  

## 2018-04-04 NOTE — Progress Notes (Signed)
5 mg neb given for wheezing

## 2018-04-05 ENCOUNTER — Encounter (HOSPITAL_COMMUNITY): Payer: Self-pay

## 2018-04-05 DIAGNOSIS — F419 Anxiety disorder, unspecified: Secondary | ICD-10-CM

## 2018-04-05 DIAGNOSIS — R45851 Suicidal ideations: Secondary | ICD-10-CM

## 2018-04-05 DIAGNOSIS — F1920 Other psychoactive substance dependence, uncomplicated: Secondary | ICD-10-CM

## 2018-04-05 DIAGNOSIS — F1024 Alcohol dependence with alcohol-induced mood disorder: Secondary | ICD-10-CM

## 2018-04-05 DIAGNOSIS — G4700 Insomnia, unspecified: Secondary | ICD-10-CM

## 2018-04-05 LAB — RENAL FUNCTION PANEL
Albumin: 2.7 g/dL — ABNORMAL LOW (ref 3.5–5.0)
Anion gap: 9 (ref 5–15)
BUN: 8 mg/dL (ref 6–20)
CO2: 27 mmol/L (ref 22–32)
Calcium: 8.8 mg/dL — ABNORMAL LOW (ref 8.9–10.3)
Chloride: 104 mmol/L (ref 98–111)
Creatinine, Ser: 0.94 mg/dL (ref 0.44–1.00)
GFR calc Af Amer: >60 mL/min (ref >60)
GFR calc non Af Amer: >60 mL/min (ref >60)
Glucose, Bld: 141 mg/dL — ABNORMAL HIGH (ref 70–99)
Phosphorus: 3.7 mg/dL (ref 2.5–4.6)
Potassium: 4.6 mmol/L (ref 3.5–5.1)
Sodium: 140 mmol/L (ref 135–145)

## 2018-04-05 LAB — CBC WITH DIFFERENTIAL/PLATELET
Basophils Absolute: 0 10*3/uL (ref 0.0–0.1)
Basophils Relative: 0 %
Eosinophils Absolute: 0 10*3/uL (ref 0.0–0.7)
Eosinophils Relative: 0 %
HCT: 40.4 % (ref 36.0–46.0)
Hemoglobin: 12.9 g/dL (ref 12.0–15.0)
Lymphocytes Relative: 6 %
Lymphs Abs: 1.1 10*3/uL (ref 0.7–4.0)
MCH: 30.8 pg (ref 26.0–34.0)
MCHC: 31.9 g/dL (ref 30.0–36.0)
MCV: 96.4 fL (ref 78.0–100.0)
Monocytes Absolute: 0.4 10*3/uL (ref 0.1–1.0)
Monocytes Relative: 2 %
Neutro Abs: 16.7 10*3/uL — ABNORMAL HIGH (ref 1.7–7.7)
Neutrophils Relative %: 92 %
Platelets: 250 10*3/uL (ref 150–400)
RBC: 4.19 MIL/uL (ref 3.87–5.11)
RDW: 16.7 % — ABNORMAL HIGH (ref 11.5–15.5)
WBC: 18.2 10*3/uL — ABNORMAL HIGH (ref 4.0–10.5)

## 2018-04-05 LAB — HEMOGLOBIN A1C
Hgb A1c MFr Bld: 5.2 % (ref 4.8–5.6)
Mean Plasma Glucose: 103 mg/dL

## 2018-04-05 LAB — MAGNESIUM: Magnesium: 2 mg/dL (ref 1.7–2.4)

## 2018-04-05 LAB — GLUCOSE, CAPILLARY: Glucose-Capillary: 134 mg/dL — ABNORMAL HIGH (ref 70–99)

## 2018-04-05 LAB — HIV ANTIBODY (ROUTINE TESTING W REFLEX): HIV SCREEN 4TH GENERATION: NONREACTIVE

## 2018-04-05 MED ORDER — ACETAMINOPHEN 325 MG PO TABS
650.0000 mg | ORAL_TABLET | Freq: Three times a day (TID) | ORAL | Status: AC
  Administered 2018-04-05 – 2018-04-07 (×6): 650 mg via ORAL
  Filled 2018-04-05 (×6): qty 2

## 2018-04-05 MED ORDER — ZOLPIDEM TARTRATE 5 MG PO TABS
5.0000 mg | ORAL_TABLET | Freq: Every evening | ORAL | Status: DC | PRN
  Administered 2018-04-06: 5 mg via ORAL
  Filled 2018-04-05: qty 1

## 2018-04-05 MED ORDER — MAGIC MOUTHWASH W/LIDOCAINE
5.0000 mL | Freq: Once | ORAL | Status: AC
  Administered 2018-04-05: 5 mL via ORAL
  Filled 2018-04-05: qty 5

## 2018-04-05 MED ORDER — KETOROLAC TROMETHAMINE 15 MG/ML IJ SOLN
15.0000 mg | Freq: Three times a day (TID) | INTRAMUSCULAR | Status: DC | PRN
  Administered 2018-04-05 – 2018-04-07 (×6): 15 mg via INTRAVENOUS
  Filled 2018-04-05 (×7): qty 1

## 2018-04-05 MED ORDER — IPRATROPIUM-ALBUTEROL 0.5-2.5 (3) MG/3ML IN SOLN
3.0000 mL | Freq: Four times a day (QID) | RESPIRATORY_TRACT | Status: DC | PRN
  Filled 2018-04-05: qty 9

## 2018-04-05 MED ORDER — IBUPROFEN 200 MG PO TABS
600.0000 mg | ORAL_TABLET | Freq: Four times a day (QID) | ORAL | Status: DC | PRN
  Administered 2018-04-06: 600 mg via ORAL
  Filled 2018-04-05 (×2): qty 3

## 2018-04-05 NOTE — Progress Notes (Signed)
Nutrition Brief Note  Consult received for assessment of nutrition requirements and status.   Wt Readings from Last 15 Encounters:  04/04/18 83.9 kg  01/14/18 87.2 kg  05/29/17 77.1 kg  12/10/16 78 kg  12/05/14 86.4 kg  07/06/14 77.1 kg  03/29/14 77.1 kg  05/24/13 80.7 kg    Body mass index is 34.94 kg/m. Patient meets criteria for obesity based on current BMI. Patient has lost 3.3 kg/8 lb/4.2% body weight in the past 3 months. This is not significant for time frame. Skin WDL. Patient admitted for COPD exacerbation and has hx of COPD on home O2, polysubstance abuse (alcohol and cocaine), depression, anxiety, and bipolar disorder with previous SAs.   Current diet order is 2 gram Na. Diet advanced from CLD to current diet today at 7:54 AM. Patient ordered breakfast after that time and ate 100% of meal (1110 kcal, 30 grams of protein) without pain or nausea and had no difficulties chewing or swallowing. Labs and medications reviewed.   No nutrition interventions warranted at this time. If nutrition issues arise, please re-consult RD.     Trenton Gammon, MS, RD, LDN, [facility] Inpatient Clinical Dietitian Pager # [phone] After hours/weekend pager # [phone]

## 2018-04-05 NOTE — Progress Notes (Signed)
[facility] PROGRESS NOTE  Charlotte Fisher ZOX:[id number] DOB: 25-Apr-1966 DOA: 04/03/2018 PCP: Patient, No Pcp Per  Brief summary    52 y.o. female with medical history significant of COPD, chronic respiratory failure on home oxygen, polysubstance abuse including alcohol and cocaine, depression with anxiety, bipolar disorder with previous suicide attempts who presented to the ER with progressive shortness of breath and cough.  Patient continues to smoke cigarettes.  She was notably wheezing and becomes hypoxic in the ER.  She was evaluated and also found to have use cocaine as well as alcohol tonight.  Her shortness of breath started shortly after using the cocaine  Assessment/Plan:  COPD exacerbation. Still wheezing bilateral. Cont steroids, scheduled/prn bronchodilators, oxygen. Counseled to stop smoking  Acute on chronic respiratory failure on home oxygen due to COPD. 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 Hypoxia->resolved with low flow oxygen. Cont as above. Counseled to stop using cocaine   HTN. Monitor on lisinopril   Depression. previous suicide attempts. Patient reports suicidal thoughts no plans. 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 Cont safety monitoring, suicidal precautions, sitter. consulted psychiatry evaluation. Avoid qt prolonging agents. Repeat ECG   Polysubstance abuse including alcohol and cocaine. No s/s of active withdrawals. Will deescalate ciwa. Will monitor   Code Status: full Family Communication: d/w patient, RN (indicate person spoken with, relationship, and if by phone, the number) Disposition Plan: pend clinical improvement    Consultants:  Psychiatry   Procedures:  none  Antibiotics:  doxy (indicate start date, and stop date if known)  HPI/Subjective: Alert, mild wheezing. No acute pains. No acute distress   Objective: Vitals:   04/05/18 0036 04/05/18 0500  BP: 122/80 122/87  Pulse: (!) 106 (!) 103  Resp: 16 20   Temp: 98.7 F (37.1 C) 98.1 F (36.7 C)  SpO2: 96% 91%    Intake/Output Summary (Last 24 hours) at 04/05/2018 0744 Last data filed at 04/05/2018 0600 Gross per 24 hour  Intake 2150 ml  Output 1 ml  Net 2149 ml   Filed Weights   04/04/18 0006 04/04/18 0629  Weight: 88.5 kg 83.9 kg    Exam:   General:  No distress   Cardiovascular: s1,s2 rrr  Respiratory: few wheezing   Abdomen: soft, nt, nd   Musculoskeletal: no leg edema    Data Reviewed: Basic Metabolic Panel: Recent Labs  Lab 04/04/18 0022 04/04/18 0100 04/04/18 0638 04/05/18 0441  NA  --  142 144 140  K  --  3.0* 3.9 4.6  CL  --  104 106 104  CO2  --   --  26 27  GLUCOSE  --  128* 155* 141*  BUN  --  <3* <5* 8  CREATININE  --  0.80 0.91 0.94  CALCIUM  --   --  8.9 8.8*  MG 1.6*  --   --  2.0  PHOS  --   --   --  3.7   Liver Function Tests: Recent Labs  Lab 04/05/18 0441  ALBUMIN 2.7*   No results for input(s): LIPASE, AMYLASE in the last 168 hours. No results for input(s): AMMONIA in the last 168 hours. CBC: Recent Labs  Lab 04/04/18 0022 04/04/18 0100 04/04/18 0638 04/05/18 0441  WBC 11.5*  --  7.2 18.2*  NEUTROABS 7.4  --   --  16.7*  HGB 14.0 13.6 13.8 12.9  HCT 42.1 40.0 42.1 40.4  MCV 93.1  --  94.2 96.4  PLT 257  --  281 250  Cardiac Enzymes: Recent Labs  Lab 04/04/18 0949 04/04/18 1525  TROPONINI <0.03 <0.03   BNP (last 3 results) Recent Labs    04/04/18 0021  BNP 30.9    ProBNP (last 3 results) No results for input(s): PROBNP in the last 8760 hours.  CBG: Recent Labs  Lab 04/04/18 1134 04/04/18 1653 04/04/18 2213  GLUCAP 173* 136* 186*    No results found for this or any previous visit (from the past 240 hour(s)).   Studies: Dg Chest Portable 1 View  Result Date: 04/04/2018 CLINICAL DATA:  Difficulty breathing for months.  Productive cough. EXAM: PORTABLE CHEST 1 VIEW COMPARISON:  January 14, 2018 FINDINGS: The heart size and mediastinal contours are  within normal limits. Both lungs are clear. The visualized skeletal structures are unremarkable. IMPRESSION: No active disease. Electronically Signed   By: Gerome Sam III M.D   On: 04/04/2018 00:43    Scheduled Meds: . albuterol  5 mg Nebulization Once  . citalopram  20 mg Oral Daily  . doxycycline  100 mg Oral Q12H  . enoxaparin (LOVENOX) injection  40 mg Subcutaneous Q24H  . folic acid  1 mg Oral Daily  . gabapentin  800 mg Oral 5 X Daily  . lisinopril  20 mg Oral Daily  . mometasone-formoterol  2 puff Inhalation BID  . multivitamin with minerals  1 tablet Oral Daily  . nicotine  21 mg Transdermal Daily  . predniSONE  40 mg Oral Q breakfast  . thiamine  100 mg Oral Daily   Or  . thiamine  100 mg Intravenous Daily  . tiotropium  18 mcg Inhalation Daily   Continuous Infusions:  Principal Problem:   COPD with acute exacerbation (HCC) Active Problems:   Polysubstance abuse (HCC)   Opiate abuse, continuous (HCC)   Alcohol dependence with alcohol-induced mood disorder (HCC)   Polysubstance dependence (HCC)    Time spent: >35 minutes     Esperanza Sheets  [facility] Pager [phone]. If 7PM-7AM, please contact night-coverage at [email or web address], password [facility] 04/05/2018, 7:44 AM  LOS: 1 day
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
## 2018-04-06 DIAGNOSIS — F1910 Other psychoactive substance abuse, uncomplicated: Secondary | ICD-10-CM

## 2018-04-06 MED ORDER — MAGIC MOUTHWASH W/LIDOCAINE
5.0000 mL | Freq: Four times a day (QID) | ORAL | Status: DC | PRN
  Administered 2018-04-06 (×2): 5 mL via ORAL
  Filled 2018-04-06 (×3): qty 5

## 2018-04-06 MED ORDER — DILTIAZEM HCL ER COATED BEADS 120 MG PO CP24
120.0000 mg | ORAL_CAPSULE | Freq: Every day | ORAL | Status: DC
  Administered 2018-04-06 – 2018-04-07 (×2): 120 mg via ORAL
  Filled 2018-04-06 (×2): qty 1

## 2018-04-06 NOTE — Progress Notes (Signed)
[facility] PROGRESS NOTE  Charlotte Fisher ZOX:[id number] DOB: 07/28/1965 DOA: 04/03/2018 PCP: Patient, No Pcp Per  Brief summary    52 y.o. female with medical history significant of COPD, chronic respiratory failure on home oxygen, polysubstance abuse including alcohol and cocaine, depression with anxiety, bipolar disorder with previous suicide attempts who presented to the ER with progressive shortness of breath and cough.  Patient continues to smoke cigarettes.  She was notably wheezing and becomes hypoxic in the ER.  She was evaluated and also found to have use cocaine as well as alcohol tonight.  Her shortness of breath started shortly after using the cocaine  Assessment/Plan:  COPD exacerbation. Still wheezing bilateral, slowly improving. Cont steroids, scheduled/prn bronchodilators, oxygen. Counseled to stop smoking  Acute on chronic respiratory failure on home oxygen due to COPD. 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 Hypoxia->resolved with low flow oxygen. Cont as above. Counseled to stop using cocaine   HTN. Monitor on lisinopril, added cardizem.    Depression. previous suicide attempts. 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 Pt is seen by psychiatry who felt safe for outpatient treatment.  Will consulted SW. Patient is interested in inpatient substance abuse treatment.  Avoid qt prolonging agents. Repeat ECG AM  Polysubstance abuse including alcohol and cocaine. No s/s of active withdrawals. Will deescalate ciwa AM. Will monitor   Code Status: full Family Communication: d/w patient, RN (indicate person spoken with, relationship, and if by phone, the number) Disposition Plan: pend clinical improvement. Consulted SW   Consultants:  Psychiatry   Procedures:  none  Antibiotics:  doxy (indicate start date, and stop date if known)  HPI/Subjective: Patient is asking for inpatient substance abuse rehab. Will consult SW. Alert, mild wheezing. No  acute pains. No acute distress   Objective: Vitals:   04/06/18 0514 04/06/18 0745  BP: (!) 147/99   Pulse: (!) 115   Resp:    Temp:    SpO2: 94% 96%    Intake/Output Summary (Last 24 hours) at 04/06/2018 1148 Last data filed at 04/06/2018 1128 Gross per 24 hour  Intake 1560 ml  Output -  Net 1560 ml   Filed Weights   04/04/18 0006 04/04/18 0629  Weight: 88.5 kg 83.9 kg    Exam:   General:  No distress   Cardiovascular: s1,s2 rrr  Respiratory: few wheezing   Abdomen: soft, nt, nd   Musculoskeletal: no leg edema    Data Reviewed: Basic Metabolic Panel: Recent Labs  Lab 04/04/18 0022 04/04/18 0100 04/04/18 0638 04/05/18 0441  NA  --  142 144 140  K  --  3.0* 3.9 4.6  CL  --  104 106 104  CO2  --   --  26 27  GLUCOSE  --  128* 155* 141*  BUN  --  <3* <5* 8  CREATININE  --  0.80 0.91 0.94  CALCIUM  --   --  8.9 8.8*  MG 1.6*  --   --  2.0  PHOS  --   --   --  3.7   Liver Function Tests: Recent Labs  Lab 04/05/18 0441  ALBUMIN 2.7*   No results for input(s): LIPASE, AMYLASE in the last 168 hours. No results for input(s): AMMONIA in the last 168 hours. CBC: Recent Labs  Lab 04/04/18 0022 04/04/18 0100 04/04/18 0638 04/05/18 0441  WBC 11.5*  --  7.2 18.2*  NEUTROABS 7.4  --   --  16.7*  HGB 14.0 13.6 13.8 12.9  HCT  42.1 40.0 42.1 40.4  MCV 93.1  --  94.2 96.4  PLT 257  --  281 250   Cardiac Enzymes: Recent Labs  Lab 04/04/18 0949 04/04/18 1525  TROPONINI <0.03 <0.03   BNP (last 3 results) Recent Labs    04/04/18 0021  BNP 30.9    ProBNP (last 3 results) No results for input(s): PROBNP in the last 8760 hours.  CBG: Recent Labs  Lab 04/04/18 1134 04/04/18 1653 04/04/18 2213 04/05/18 0745  GLUCAP 173* 136* 186* 134*    No results found for this or any previous visit (from the past 240 hour(s)).   Studies: No results found.  Scheduled Meds: . acetaminophen  650 mg Oral TID  . diltiazem  120 mg Oral Daily  . doxycycline   100 mg Oral Q12H  . enoxaparin (LOVENOX) injection  40 mg Subcutaneous Q24H  . folic acid  1 mg Oral Daily  . gabapentin  800 mg Oral 5 X Daily  . lisinopril  20 mg Oral Daily  . mometasone-formoterol  2 puff Inhalation BID  . multivitamin with minerals  1 tablet Oral Daily  . nicotine  21 mg Transdermal Daily  . predniSONE  40 mg Oral Q breakfast  . thiamine  100 mg Oral Daily   Or  . thiamine  100 mg Intravenous Daily  . tiotropium  18 mcg Inhalation Daily   Continuous Infusions:  Principal Problem:   Polysubstance abuse (HCC) Active Problems:   Opiate abuse, continuous (HCC)   Alcohol dependence with alcohol-induced mood disorder (HCC)   Polysubstance dependence (HCC)   COPD with acute exacerbation (HCC)    Time spent: >35 minutes     Charlotte Fisher  [facility] Pager [phone]. If 7PM-7AM, please contact night-coverage at [email or web address], password [email or web address] 04/06/2018, 11:48 AM  LOS: 2 days

## 2018-04-06 NOTE — Plan of Care (Signed)

## 2018-04-07 LAB — COMPREHENSIVE METABOLIC PANEL
ALT: 15 U/L (ref 0–44)
AST: 22 U/L (ref 15–41)
Albumin: 2.9 g/dL — ABNORMAL LOW (ref 3.5–5.0)
Alkaline Phosphatase: 71 U/L (ref 38–126)
Anion gap: 7 (ref 5–15)
BUN: 22 mg/dL — AB (ref 6–20)
CHLORIDE: 100 mmol/L (ref 98–111)
CO2: 30 mmol/L (ref 22–32)
Calcium: 8.7 mg/dL — ABNORMAL LOW (ref 8.9–10.3)
Creatinine, Ser: 1.17 mg/dL — ABNORMAL HIGH (ref 0.44–1.00)
GFR calc Af Amer: >60 mL/min (ref >60)
GFR, EST NON AFRICAN AMERICAN: 53 mL/min — AB (ref >60)
GLUCOSE: 93 mg/dL (ref 70–99)
Potassium: 4.7 mmol/L (ref 3.5–5.1)
Sodium: 137 mmol/L (ref 135–145)
Total Bilirubin: 0.6 mg/dL (ref 0.3–1.2)
Total Protein: 6.6 g/dL (ref 6.5–8.1)

## 2018-04-07 LAB — CBC
HCT: 45 % (ref 36.0–46.0)
Hemoglobin: 14.3 g/dL (ref 12.0–15.0)
MCH: 30.4 pg (ref 26.0–34.0)
MCHC: 31.8 g/dL (ref 30.0–36.0)
MCV: 95.7 fL (ref 80.0–100.0)
NRBC: 0 % (ref 0.0–0.2)
PLATELETS: 258 10*3/uL (ref 150–400)
RBC: 4.7 MIL/uL (ref 3.87–5.11)
RDW: 16.3 % — ABNORMAL HIGH (ref 11.5–15.5)
WBC: 10.5 10*3/uL (ref 4.0–10.5)

## 2018-04-07 MED ORDER — NICOTINE 21 MG/24HR TD PT24: 21.0000 mg | MEDICATED_PATCH | Freq: Every day | TRANSDERMAL | 0 refills | Status: DC

## 2018-04-07 MED ORDER — FOLIC ACID 1 MG PO TABS: 1.0000 mg | ORAL_TABLET | Freq: Every day | ORAL | Status: DC

## 2018-04-07 MED ORDER — PREDNISONE 10 MG PO TABS: ORAL_TABLET | ORAL | 0 refills | Status: DC

## 2018-04-07 MED ORDER — DILTIAZEM HCL ER COATED BEADS 120 MG PO CP24: 120.0000 mg | ORAL_CAPSULE | Freq: Every day | ORAL | 0 refills | Status: DC

## 2018-04-07 NOTE — Progress Notes (Signed)
Pt aware she is being D/C'd today, SW still in process of determining where she will D/C'd to. Pt aware. Report given to oncoming RN.

## 2018-04-07 NOTE — Progress Notes (Signed)
Pt c/o anxiety, upset her Ativan order has expired. CIWA score 11 d/t anxiety and bilateral hand tremors. Pt states she is nauseated and vomiting, but sitter states pt has not vomited. Pt also c/o mild HA. MD Jerolyn Center made aware- did not reorder Ativan. Toradol given per request. Pt asking for SW and for RN to call rehab facilities. Per MD, SW is to see pt.

## 2018-04-07 NOTE — Discharge Summary (Signed)
Physician Discharge Summary  Charlotte Fisher ZOX:[id number] DOB: [birth year] DOA: 04/03/2018  PCP: Patient, No Pcp Per  Admit date: 04/03/2018 Discharge date: 04/07/2018  Admitted From: home Disposition:  home Recommendations for Outpatient Follow-up:  1. Follow up with PCP in 1-2 weeks 2. Please obtain BMP/CBC in one week  Home Health none Equipment/Devices none Discharge Condition stable CODE STATUS full Diet recommendation: cardiac Brief/Interim Summary:52 y.o.femalewith medical history significant ofCOPD, chronic respiratory failure on home oxygen, polysubstance abuse including alcohol and cocaine, depression with anxiety, bipolar disorder with previous suicide attempts who presented to the ER with progressive shortness of breath and cough. Patient continues to smoke cigarettes. She was notably wheezing and becomes hypoxic in the ER. She was evaluated and also found to have use cocaine as well as alcohol tonight. Her shortness of breath started shortly after using the cocaine  Discharge Diagnoses:  Principal Problem:   Polysubstance abuse (HCC) Active Problems:   Opiate abuse, continuous (HCC)   Alcohol dependence with alcohol-induced mood disorder (HCC)   Polysubstance dependence (HCC)   COPD with acute exacerbation (HCC)  COPD exacerbation. Improved. bronchodilators Counseled to stop smoking.po steroids on dc  Acute on chronic respiratory failure on home oxygen due to COPD. 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 Hypoxia->resolved with low flow oxygen. Cont as above. Counseled to stop using cocaine   HTN. Monitor on lisinopril, added cardizem.    Depression. previous suicide attempts. 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 Pt is seen by psychiatry who felt safe for outpatient treatment. consulted SW. Patient is interested in inpatient substance abuse treatment.  Avoid qt prolonging agents.   Polysubstance abuse including alcohol and cocaine  -counseled about quitting    Discharge Instructions  Discharge Instructions    Call MD for:  difficulty breathing, headache or visual disturbances   Complete by:  As directed    Diet - low sodium heart healthy   Complete by:  As directed    Increase activity slowly   Complete by:  As directed      Allergies as of 04/07/2018   No Known Allergies     Medication List    STOP taking these medications   albuterol 108 (90 Base) MCG/ACT inhaler Commonly known as:  PROVENTIL HFA;VENTOLIN HFA   ibuprofen 600 MG tablet Commonly known as:  ADVIL,MOTRIN   levalbuterol 0.63 MG/3ML nebulizer solution Commonly known as:  XOPENEX   naltrexone 50 MG tablet Commonly known as:  DEPADE   pantoprazole 40 MG tablet Commonly known as:  PROTONIX   senna-docusate 8.6-50 MG tablet Commonly known as:  Senokot-S   SYMBICORT 160-4.5 MCG/ACT inhaler Generic drug:  budesonide-formoterol     TAKE these medications   citalopram 20 MG tablet Commonly known as:  CELEXA Take 1 tablet (20 mg total) by mouth daily. For depression   COMBIVENT 18-103 MCG/ACT inhaler Generic drug:  albuterol-ipratropium Inhale 1-2 puffs into the lungs every 4 (four) hours as needed. For shortness of breath   diltiazem 120 MG 24 hr capsule Commonly known as:  CARDIZEM CD Take 1 capsule (120 mg total) by mouth daily. Start taking on:  04/08/2018   folic acid 1 MG tablet Commonly known as:  FOLVITE Take 1 tablet (1 mg total) by mouth daily. Start taking on:  04/08/2018 What changed:  additional instructions   gabapentin 400 MG capsule Commonly known as:  NEURONTIN Take 2 capsules (800 mg total) by mouth 3 (three) times daily. For agitation What changed:  when to take this  hydrOXYzine 50 MG tablet Commonly known as:  ATARAX/VISTARIL Take 1 tablet (50 mg total) by mouth every 6 (six) hours as needed for anxiety.   lisinopril 20 MG tablet Commonly known as:  PRINIVIL,ZESTRIL Take 20 mg by mouth daily.    mometasone-formoterol 100-5 MCG/ACT Aero Commonly known as:  DULERA Inhale 2 puffs into the lungs 2 (two) times daily. For shortness of breath   nicotine 21 mg/24hr patch Commonly known as:  NICODERM CQ - dosed in mg/24 hours Place 1 patch (21 mg total) onto the skin daily. Start taking on:  04/08/2018 What changed:  additional instructions   predniSONE 10 MG tablet Commonly known as:  DELTASONE Take 3 tablets daily for first 3 days then 2 tablets daily for the next 3 days and then 1 tablet daily till done.   tiotropium 18 MCG inhalation capsule Commonly known as:  SPIRIVA Place 1 capsule (18 mcg total) into inhaler and inhale daily. For cOPD   traMADol 50 MG tablet Commonly known as:  ULTRAM Take 1 tablet (50 mg total) by mouth every 8 (eight) hours as needed for severe pain.   traZODone 100 MG tablet Commonly known as:  DESYREL Take 1 tablet (100 mg total) by mouth at bedtime as needed for sleep.      Follow-up Information    [facility] Follow up.   Contact information: [address] [place] [address] [phone]         No Known Allergies  Consultations:  none   Procedures/Studies: Dg Chest Portable 1 View  Result Date: 04/04/2018 CLINICAL DATA:  Difficulty breathing for months.  Productive cough. EXAM: PORTABLE CHEST 1 VIEW COMPARISON:  January 14, 2018 FINDINGS: The heart size and mediastinal contours are within normal limits. Both lungs are clear. The visualized skeletal structures are unremarkable. IMPRESSION: No active disease. Electronically Signed   By: Gerome Sam III M.D   On: 04/04/2018 00:43    (Echo, Carotid, EGD, Colonoscopy, ERCP)    Subjective:   Discharge Exam: Vitals:   04/07/18 0848 04/07/18 0851  BP:    Pulse:    Resp:    Temp:    SpO2: 97% 97%   Vitals:   04/06/18 2300 04/07/18 0500 04/07/18 0848 04/07/18 0851  BP: 137/76 128/76    Pulse: 81 80    Resp: 16 20     Temp: 98.4 F (36.9 C) 97.7 F (36.5 C)    TempSrc: Oral Oral    SpO2: 97% 99% 97% 97%  Weight:      Height:        General: Pt is alert, awake, not in acute distress Cardiovascular: RRR, S1/S2 +, no rubs, no gallops Respiratory: wheezing decreased  bilaterally, no wheezing, no rhonchi Abdominal: Soft, NT, ND, bowel sounds + Extremities: no edema, no cyanosis    The results of significant diagnostics from this hospitalization (including imaging, microbiology, ancillary and laboratory) are listed below for reference.     Microbiology: No results found for this or any previous visit (from the past 240 hour(s)).   Labs: BNP (last 3 results) Recent Labs    04/04/18 0021  BNP 30.9   Basic Metabolic Panel: Recent Labs  Lab 04/04/18 0022 04/04/18 0100 04/04/18 0638 04/05/18 0441 04/07/18 0809  NA  --  142 144 140 137  K  --  3.0* 3.9 4.6 4.7  CL  --  104 106 104 100  CO2  --   --  26 27 30   GLUCOSE  --  128* 155* 141* 93  BUN  --  <3* <5* 8 22*  CREATININE  --  0.80 0.91 0.94 1.17*  CALCIUM  --   --  8.9 8.8* 8.7*  MG 1.6*  --   --  2.0  --   PHOS  --   --   --  3.7  --    Liver Function Tests: Recent Labs  Lab 04/05/18 0441 04/07/18 0809  AST  --  22  ALT  --  15  ALKPHOS  --  71  BILITOT  --  0.6  PROT  --  6.6  ALBUMIN 2.7* 2.9*   No results for input(s): LIPASE, AMYLASE in the last 168 hours. No results for input(s): AMMONIA in the last 168 hours. CBC: Recent Labs  Lab 04/04/18 0022 04/04/18 0100 04/04/18 0638 04/05/18 0441 04/07/18 0809  WBC 11.5*  --  7.2 18.2* 10.5  NEUTROABS 7.4  --   --  16.7*  --   HGB 14.0 13.6 13.8 12.9 14.3  HCT 42.1 40.0 42.1 40.4 45.0  MCV 93.1  --  94.2 96.4 95.7  PLT 257  --  281 250 258   Cardiac Enzymes: Recent Labs  Lab 04/04/18 0949 04/04/18 1525  TROPONINI <0.03 <0.03   BNP: Invalid input(s): POCBNP CBG: Recent Labs  Lab 04/04/18 1134 04/04/18 1653 04/04/18 2213 04/05/18 0745  GLUCAP 173*  136* 186* 134*   D-Dimer No results for input(s): DDIMER in the last 72 hours. Hgb A1c No results for input(s): HGBA1C in the last 72 hours. Lipid Profile No results for input(s): CHOL, HDL, LDLCALC, TRIG, CHOLHDL, LDLDIRECT in the last 72 hours. Thyroid function studies No results for input(s): TSH, T4TOTAL, T3FREE, THYROIDAB in the last 72 hours.  Invalid input(s): FREET3 Anemia work up No results for input(s): VITAMINB12, FOLATE, FERRITIN, TIBC, IRON, RETICCTPCT in the last 72 hours. Urinalysis    Component Value Date/Time   COLORURINE YELLOW 05/29/2017 1509   APPEARANCEUR CLEAR 05/29/2017 1509   APPEARANCEUR Clear 10/01/2014 1742   LABSPEC 1.034 (H) 05/29/2017 1509   LABSPEC 1.018 10/01/2014 1742   PHURINE 6.0 05/29/2017 1509   GLUCOSEU NEGATIVE 05/29/2017 1509   GLUCOSEU Negative 10/01/2014 1742   HGBUR NEGATIVE 05/29/2017 1509   BILIRUBINUR NEGATIVE 05/29/2017 1509   BILIRUBINUR Negative 10/01/2014 1742   KETONESUR 5 (A) 05/29/2017 1509   PROTEINUR NEGATIVE 05/29/2017 1509   UROBILINOGEN 0.2 03/17/2015 2234   NITRITE NEGATIVE 05/29/2017 1509   LEUKOCYTESUR NEGATIVE 05/29/2017 1509   LEUKOCYTESUR Negative 10/01/2014 1742   Sepsis Labs Invalid input(s): PROCALCITONIN,  WBC,  LACTICIDVEN Microbiology No results found for this or any previous visit (from the past 240 hour(s)).   Time coordinating discharge:  35 minutes  SIGNED:   Alwyn Ren, MD  [facility] 04/07/2018, 1:58 PM Pager   If 7PM-7AM, please contact night-coverage [email or web address] Password [email or web address]

## 2018-06-20 ENCOUNTER — Other Ambulatory Visit: Payer: Self-pay

## 2018-06-20 ENCOUNTER — Encounter (HOSPITAL_COMMUNITY): Payer: Self-pay | Admitting: Emergency Medicine

## 2018-06-20 ENCOUNTER — Emergency Department (HOSPITAL_COMMUNITY)
Admission: EM | Admit: 2018-06-20 | Discharge: 2018-06-20 | Disposition: A | Discharge status: Home / Self Care | Payer: Medicaid Other | Attending: Emergency Medicine | Admitting: Emergency Medicine

## 2018-06-20 ENCOUNTER — Emergency Department (HOSPITAL_COMMUNITY): Payer: Medicaid Other

## 2018-06-20 ENCOUNTER — Emergency Department (HOSPITAL_BASED_OUTPATIENT_CLINIC_OR_DEPARTMENT_OTHER)
Admit: 2018-06-20 | Discharge: 2018-06-20 | Disposition: A | Discharge status: Home / Self Care | Payer: Medicaid Other | Attending: Emergency Medicine | Admitting: Emergency Medicine

## 2018-06-20 DIAGNOSIS — F419 Anxiety disorder, unspecified: Secondary | ICD-10-CM | POA: Diagnosis not present

## 2018-06-20 DIAGNOSIS — R52 Pain, unspecified: Secondary | ICD-10-CM

## 2018-06-20 DIAGNOSIS — Z79899 Other long term (current) drug therapy: Secondary | ICD-10-CM | POA: Diagnosis not present

## 2018-06-20 DIAGNOSIS — F17200 Nicotine dependence, unspecified, uncomplicated: Secondary | ICD-10-CM | POA: Insufficient documentation

## 2018-06-20 DIAGNOSIS — R600 Localized edema: Secondary | ICD-10-CM | POA: Insufficient documentation

## 2018-06-20 DIAGNOSIS — M79605 Pain in left leg: Secondary | ICD-10-CM | POA: Diagnosis not present

## 2018-06-20 DIAGNOSIS — J441 Chronic obstructive pulmonary disease with (acute) exacerbation: Secondary | ICD-10-CM | POA: Diagnosis not present

## 2018-06-20 DIAGNOSIS — K149 Disease of tongue, unspecified: Secondary | ICD-10-CM | POA: Insufficient documentation

## 2018-06-20 DIAGNOSIS — R0602 Shortness of breath: Secondary | ICD-10-CM | POA: Diagnosis present

## 2018-06-20 DIAGNOSIS — R51 Headache: Secondary | ICD-10-CM | POA: Insufficient documentation

## 2018-06-20 DIAGNOSIS — Q383 Other congenital malformations of tongue: Secondary | ICD-10-CM

## 2018-06-20 LAB — CBC WITH DIFFERENTIAL/PLATELET
ABS IMMATURE GRANULOCYTES: 0.01 10*3/uL (ref 0.00–0.07)
BASOS ABS: 0.1 10*3/uL (ref 0.0–0.1)
Basophils Relative: 1 %
EOS PCT: 5 %
Eosinophils Absolute: 0.4 10*3/uL (ref 0.0–0.5)
HEMATOCRIT: 49.2 % — AB (ref 36.0–46.0)
HEMOGLOBIN: 15.8 g/dL — AB (ref 12.0–15.0)
IMMATURE GRANULOCYTES: 0 %
LYMPHS ABS: 3.2 10*3/uL (ref 0.7–4.0)
LYMPHS PCT: 36 %
MCH: 30.4 pg (ref 26.0–34.0)
MCHC: 32.1 g/dL (ref 30.0–36.0)
MCV: 94.6 fL (ref 80.0–100.0)
Monocytes Absolute: 0.7 10*3/uL (ref 0.1–1.0)
Monocytes Relative: 7 %
NEUTROS ABS: 4.6 10*3/uL (ref 1.7–7.7)
Neutrophils Relative %: 51 %
Platelets: 244 10*3/uL (ref 150–400)
RBC: 5.2 MIL/uL — AB (ref 3.87–5.11)
RDW: 13.9 % (ref 11.5–15.5)
WBC: 9 10*3/uL (ref 4.0–10.5)
nRBC: 0 % (ref 0.0–0.2)

## 2018-06-20 LAB — COMPREHENSIVE METABOLIC PANEL
ALT: 30 U/L (ref 0–44)
AST: 41 U/L (ref 15–41)
Albumin: 3.5 g/dL (ref 3.5–5.0)
Alkaline Phosphatase: 74 U/L (ref 38–126)
Anion gap: 10 (ref 5–15)
BUN: 11 mg/dL (ref 6–20)
CHLORIDE: 105 mmol/L (ref 98–111)
CO2: 25 mmol/L (ref 22–32)
CREATININE: 0.99 mg/dL (ref 0.44–1.00)
Calcium: 8.8 mg/dL — ABNORMAL LOW (ref 8.9–10.3)
GFR calc non Af Amer: >60 mL/min (ref >60)
Glucose, Bld: 90 mg/dL (ref 70–99)
POTASSIUM: 3.6 mmol/L (ref 3.5–5.1)
SODIUM: 140 mmol/L (ref 135–145)
Total Bilirubin: 0.6 mg/dL (ref 0.3–1.2)
Total Protein: 7.2 g/dL (ref 6.5–8.1)

## 2018-06-20 MED ORDER — ALBUTEROL SULFATE HFA 108 (90 BASE) MCG/ACT IN AERS
2.0000 | INHALATION_SPRAY | Freq: Once | RESPIRATORY_TRACT | Status: AC
  Administered 2018-06-20: 2 via RESPIRATORY_TRACT
  Filled 2018-06-20: qty 6.7

## 2018-06-20 MED ORDER — LORAZEPAM 1 MG PO TABS
1.0000 mg | ORAL_TABLET | Freq: Once | ORAL | Status: AC
  Administered 2018-06-20: 1 mg via ORAL
  Filled 2018-06-20: qty 1

## 2018-06-20 MED ORDER — HYDROXYZINE HCL 50 MG PO TABS: 50.0000 mg | ORAL_TABLET | Freq: Three times a day (TID) | ORAL | 0 refills | Status: DC | PRN

## 2018-06-20 MED ORDER — PREDNISONE 20 MG PO TABS
60.0000 mg | ORAL_TABLET | Freq: Once | ORAL | Status: AC
  Administered 2018-06-20: 60 mg via ORAL
  Filled 2018-06-20: qty 3

## 2018-06-20 MED ORDER — GABAPENTIN 400 MG PO CAPS: 800.0000 mg | ORAL_CAPSULE | Freq: Three times a day (TID) | ORAL | 0 refills | Status: DC

## 2018-06-20 MED ORDER — PREDNISONE 20 MG PO TABS: ORAL_TABLET | ORAL | 0 refills | Status: DC

## 2018-06-20 MED ORDER — AMOXICILLIN-POT CLAVULANATE 875-125 MG PO TABS
1.0000 | ORAL_TABLET | Freq: Once | ORAL | Status: AC
  Administered 2018-06-20: 1 via ORAL
  Filled 2018-06-20: qty 1

## 2018-06-20 MED ORDER — IPRATROPIUM-ALBUTEROL 20-100 MCG/ACT IN AERS: 1.0000 | INHALATION_SPRAY | Freq: Four times a day (QID) | RESPIRATORY_TRACT | 1 refills | Status: DC | PRN

## 2018-06-20 MED ORDER — AMOXICILLIN-POT CLAVULANATE 875-125 MG PO TABS: 1.0000 | ORAL_TABLET | Freq: Two times a day (BID) | ORAL | 0 refills | Status: DC

## 2018-06-20 MED ORDER — TRAZODONE HCL 100 MG PO TABS: 100.0000 mg | ORAL_TABLET | Freq: Every evening | ORAL | 0 refills | Status: DC | PRN

## 2018-06-20 MED ORDER — GABAPENTIN 400 MG PO CAPS
800.0000 mg | ORAL_CAPSULE | Freq: Once | ORAL | Status: AC
  Administered 2018-06-20: 800 mg via ORAL
  Filled 2018-06-20: qty 2

## 2018-06-20 MED ORDER — ALBUTEROL SULFATE HFA 108 (90 BASE) MCG/ACT IN AERS: 1.0000 | INHALATION_SPRAY | Freq: Four times a day (QID) | RESPIRATORY_TRACT | 1 refills | Status: DC | PRN

## 2018-06-20 MED ORDER — CITALOPRAM HYDROBROMIDE 20 MG PO TABS: 20.0000 mg | ORAL_TABLET | Freq: Every day | ORAL | 0 refills | Status: DC

## 2018-06-20 MED ORDER — ALBUTEROL SULFATE (2.5 MG/3ML) 0.083% IN NEBU
5.0000 mg | INHALATION_SOLUTION | Freq: Once | RESPIRATORY_TRACT | Status: AC
  Administered 2018-06-20: 5 mg via RESPIRATORY_TRACT
  Filled 2018-06-20: qty 6

## 2018-06-20 MED ORDER — SODIUM CHLORIDE 0.9 % IV BOLUS
1000.0000 mL | Freq: Once | INTRAVENOUS | Status: AC
  Administered 2018-06-20: 1000 mL via INTRAVENOUS

## 2018-06-20 NOTE — ED Notes (Signed)
US at bedside. This RN unable to obtain IV, second RN will attempt when US is complete

## 2018-06-20 NOTE — ED Notes (Signed)
Bed: [id number] Expected date:  Expected time:  Means of arrival:  Comments: Ems wheezing
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
## 2018-06-20 NOTE — Discharge Instructions (Signed)
If you develop worsening trouble breathing, high fever, vomiting, coughing up blood, or any other new/concerning symptoms and return to the ER for evaluation.  It is very important to follow-up with a primary care physician for control of your multiple medical issues and refills of your prescriptions.

## 2018-06-20 NOTE — Progress Notes (Signed)
CSW received request for taxi voucher. CSW reviewed and noted concerns with shortness of breath, symptoms of anxiety, and COPD. CSW noted concerns of [employer] operating hours. CSW will provide taxi cab for patient to return home. CSW informed on-site CSW to provide taxi voucher [place] [address] [place].

## 2018-06-20 NOTE — Progress Notes (Signed)
Left lower extremity venous duplex has been completed. Preliminary results can be found in CV Proc through chart review.   06/20/18 1:24 PM Olen CordialGreg Jania Steinke RVT

## 2018-06-20 NOTE — ED Triage Notes (Signed)
Patient BIB [facility] from home for shortness of breath and anxiety. SOB started today, pt just ran out of inhaler yesterday and became anxious without it. Pt out of anxiety meds x1 week. Expiratory wheezing noted by EMS, Duo neb given by EMS. Pt only completed 3/4 of neb, couldn't keep mask on due to anxiety.

## 2018-07-31 ENCOUNTER — Emergency Department (HOSPITAL_COMMUNITY)
Admission: EM | Admit: 2018-07-31 | Discharge: 2018-07-31 | Payer: Medicaid Other | Attending: Emergency Medicine | Admitting: Emergency Medicine

## 2018-07-31 ENCOUNTER — Encounter (HOSPITAL_COMMUNITY): Payer: Self-pay | Admitting: Emergency Medicine

## 2018-07-31 ENCOUNTER — Other Ambulatory Visit: Payer: Self-pay

## 2018-07-31 DIAGNOSIS — J449 Chronic obstructive pulmonary disease, unspecified: Secondary | ICD-10-CM | POA: Insufficient documentation

## 2018-07-31 DIAGNOSIS — F10920 Alcohol use, unspecified with intoxication, uncomplicated: Secondary | ICD-10-CM | POA: Diagnosis not present

## 2018-07-31 DIAGNOSIS — F17200 Nicotine dependence, unspecified, uncomplicated: Secondary | ICD-10-CM | POA: Insufficient documentation

## 2018-07-31 DIAGNOSIS — N189 Chronic kidney disease, unspecified: Secondary | ICD-10-CM | POA: Diagnosis not present

## 2018-07-31 DIAGNOSIS — Z79899 Other long term (current) drug therapy: Secondary | ICD-10-CM | POA: Diagnosis not present

## 2018-07-31 NOTE — Discharge Instructions (Addendum)
You have been evaluated and are found to be stable for discharge. It is recommended that you stop smoking.

## 2018-07-31 NOTE — ED Notes (Signed)
Patient said "I want to get out of here, when can I leave?"
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
## 2018-07-31 NOTE — ED Notes (Signed)
Bed: GG83 Expected date:  Expected time:  Means of arrival:  Comments: 54 ETOH SOB 98% RA uncooperative

## 2018-09-21 ENCOUNTER — Emergency Department (HOSPITAL_COMMUNITY): Payer: Medicaid Other

## 2018-09-21 ENCOUNTER — Other Ambulatory Visit: Payer: Self-pay

## 2018-09-21 ENCOUNTER — Emergency Department (HOSPITAL_COMMUNITY)
Admission: EM | Admit: 2018-09-21 | Discharge: 2018-09-21 | Disposition: A | Discharge status: Home / Self Care | Payer: Medicaid Other | Attending: Emergency Medicine | Admitting: Emergency Medicine

## 2018-09-21 ENCOUNTER — Encounter (HOSPITAL_COMMUNITY): Payer: Self-pay

## 2018-09-21 DIAGNOSIS — J449 Chronic obstructive pulmonary disease, unspecified: Secondary | ICD-10-CM | POA: Insufficient documentation

## 2018-09-21 DIAGNOSIS — F17210 Nicotine dependence, cigarettes, uncomplicated: Secondary | ICD-10-CM | POA: Diagnosis not present

## 2018-09-21 DIAGNOSIS — M79605 Pain in left leg: Secondary | ICD-10-CM

## 2018-09-21 MED ORDER — LIDOCAINE 5 % EX OINT: 1.0000 "application " | TOPICAL_OINTMENT | Freq: Three times a day (TID) | CUTANEOUS | 0 refills | Status: DC | PRN

## 2018-09-21 MED ORDER — IBUPROFEN 200 MG PO TABS
600.0000 mg | ORAL_TABLET | Freq: Once | ORAL | Status: AC
  Administered 2018-09-21: 600 mg via ORAL
  Filled 2018-09-21: qty 3

## 2018-09-21 MED ORDER — ALBUTEROL SULFATE HFA 108 (90 BASE) MCG/ACT IN AERS
2.0000 | INHALATION_SPRAY | Freq: Once | RESPIRATORY_TRACT | Status: AC
  Administered 2018-09-21: 2 via RESPIRATORY_TRACT
  Filled 2018-09-21: qty 6.7

## 2018-09-21 NOTE — Progress Notes (Signed)
CSW provided pt with a list of shelters.  Pt was appreciative and thanked the CSW and stated she had a place to stay with her friend tonight in [place] for just one night only.  Please reconsult if future social work needs arise.  CSW signing off, as social work intervention is no longer needed.  Charlotte Pea. Rozanne Heumann, LCSW, LCAS, CSI Transitions of Care Clinical Social Worker Care Coordination Department Ph: [phone]
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
## 2018-09-21 NOTE — ED Notes (Signed)
Pt requesting a drink - EDP advised she is not NPO and I told her I would bring her a drink. Pt is on the phone with family and is yelling.  She has been asked multiple times to lower her voice and she has continued. X-ray is going to come back to get her for imaging due to the pt being on the phone and not listening to providers.

## 2018-09-21 NOTE — Progress Notes (Signed)
CSW received a call from an EMT Morrie Sheldon about pt who just released from jail.    CSW provided CSW's # to pt's EMT should RN need to speak to the CSW.  CSW will continue to follow for D/C needs.  Dorothe Pea. Samwise Eckardt, LCSW, LCAS, CSI Transitions of Care Clinical Social Worker Care Coordination Department Ph: [phone]

## 2018-09-21 NOTE — ED Notes (Signed)
Patient given hospital socks to put on her feet. Patient wants the hospital to find her somewhere to stay because she was in jail ans is not from here.

## 2018-09-21 NOTE — ED Notes (Signed)
Patient is moving all extremities with no problem. Patient states she hurt her left hip. Patient has no bruises or open skin.

## 2018-09-21 NOTE — ED Triage Notes (Signed)
EMS reports just released from jail, c/o pain to left leg hip to foot, ongoing x several months.   BP 140/90 HR 80 RR 18 Sp02 98 RA

## 2018-09-21 NOTE — ED Notes (Signed)
Bed: WA08 Expected date:  Expected time:  Means of arrival:  Comments: 37F leg pain from DT

## 2018-09-21 NOTE — ED Notes (Signed)
Patient is requesting something to drink. Patient yelling on phone trying to get someone to get her.

## 2018-09-21 NOTE — ED Notes (Signed)
Pt transported to xray at this time

## 2018-09-21 NOTE — Discharge Instructions (Signed)

## 2018-10-15 ENCOUNTER — Other Ambulatory Visit: Payer: Self-pay

## 2018-10-15 ENCOUNTER — Encounter (HOSPITAL_COMMUNITY): Payer: Self-pay | Admitting: Emergency Medicine

## 2018-10-15 ENCOUNTER — Emergency Department (HOSPITAL_COMMUNITY)
Admission: EM | Admit: 2018-10-15 | Discharge: 2018-10-15 | Disposition: A | Discharge status: Home / Self Care | Payer: Medicaid Other | Attending: Emergency Medicine | Admitting: Emergency Medicine

## 2018-10-15 DIAGNOSIS — F17210 Nicotine dependence, cigarettes, uncomplicated: Secondary | ICD-10-CM | POA: Insufficient documentation

## 2018-10-15 DIAGNOSIS — K2960 Other gastritis without bleeding: Secondary | ICD-10-CM | POA: Insufficient documentation

## 2018-10-15 DIAGNOSIS — Z79899 Other long term (current) drug therapy: Secondary | ICD-10-CM | POA: Diagnosis not present

## 2018-10-15 DIAGNOSIS — T39395A Adverse effect of other nonsteroidal anti-inflammatory drugs [NSAID], initial encounter: Secondary | ICD-10-CM | POA: Insufficient documentation

## 2018-10-15 DIAGNOSIS — F319 Bipolar disorder, unspecified: Secondary | ICD-10-CM | POA: Diagnosis not present

## 2018-10-15 DIAGNOSIS — M1612 Unilateral primary osteoarthritis, left hip: Secondary | ICD-10-CM | POA: Insufficient documentation

## 2018-10-15 DIAGNOSIS — R110 Nausea: Secondary | ICD-10-CM | POA: Insufficient documentation

## 2018-10-15 DIAGNOSIS — M79605 Pain in left leg: Secondary | ICD-10-CM | POA: Insufficient documentation

## 2018-10-15 DIAGNOSIS — J449 Chronic obstructive pulmonary disease, unspecified: Secondary | ICD-10-CM | POA: Insufficient documentation

## 2018-10-15 DIAGNOSIS — M5432 Sciatica, left side: Secondary | ICD-10-CM

## 2018-10-15 DIAGNOSIS — R1012 Left upper quadrant pain: Secondary | ICD-10-CM | POA: Diagnosis present

## 2018-10-15 DIAGNOSIS — M5442 Lumbago with sciatica, left side: Secondary | ICD-10-CM | POA: Diagnosis not present

## 2018-10-15 LAB — URINALYSIS, ROUTINE W REFLEX MICROSCOPIC
Bilirubin Urine: NEGATIVE
Glucose, UA: NEGATIVE mg/dL
Hgb urine dipstick: NEGATIVE
Ketones, ur: NEGATIVE mg/dL
Leukocytes,Ua: NEGATIVE
Nitrite: NEGATIVE
Protein, ur: NEGATIVE mg/dL
Specific Gravity, Urine: 1.002 — ABNORMAL LOW (ref 1.005–1.030)
pH: 6 (ref 5.0–8.0)

## 2018-10-15 LAB — COMPREHENSIVE METABOLIC PANEL
ALT: 28 U/L (ref 0–44)
AST: 33 U/L (ref 15–41)
Albumin: 3.3 g/dL — ABNORMAL LOW (ref 3.5–5.0)
Alkaline Phosphatase: 83 U/L (ref 38–126)
Anion gap: 11 (ref 5–15)
BUN: 7 mg/dL (ref 6–20)
CO2: 24 mmol/L (ref 22–32)
Calcium: 8.8 mg/dL — ABNORMAL LOW (ref 8.9–10.3)
Chloride: 101 mmol/L (ref 98–111)
Creatinine, Ser: 1.14 mg/dL — ABNORMAL HIGH (ref 0.44–1.00)
GFR calc Af Amer: >60 mL/min (ref >60)
GFR calc non Af Amer: 55 mL/min — ABNORMAL LOW (ref >60)
Glucose, Bld: 108 mg/dL — ABNORMAL HIGH (ref 70–99)
Potassium: 3.4 mmol/L — ABNORMAL LOW (ref 3.5–5.1)
Sodium: 136 mmol/L (ref 135–145)
Total Bilirubin: 0.8 mg/dL (ref 0.3–1.2)
Total Protein: 6.7 g/dL (ref 6.5–8.1)

## 2018-10-15 LAB — CBC
HCT: 38.7 % (ref 36.0–46.0)
Hemoglobin: 12.6 g/dL (ref 12.0–15.0)
MCH: 30.4 pg (ref 26.0–34.0)
MCHC: 32.6 g/dL (ref 30.0–36.0)
MCV: 93.3 fL (ref 80.0–100.0)
Platelets: 333 10*3/uL (ref 150–400)
RBC: 4.15 MIL/uL (ref 3.87–5.11)
RDW: 15.6 % — ABNORMAL HIGH (ref 11.5–15.5)
WBC: 13.7 10*3/uL — ABNORMAL HIGH (ref 4.0–10.5)
nRBC: 0 % (ref 0.0–0.2)

## 2018-10-15 LAB — LIPASE, BLOOD: Lipase: 43 U/L (ref 11–51)

## 2018-10-15 MED ORDER — ALBUTEROL SULFATE HFA 108 (90 BASE) MCG/ACT IN AERS: 1.0000 | INHALATION_SPRAY | Freq: Four times a day (QID) | RESPIRATORY_TRACT | 0 refills | Status: DC | PRN

## 2018-10-15 MED ORDER — HYDROXYZINE HCL 50 MG PO TABS: 50.0000 mg | ORAL_TABLET | Freq: Three times a day (TID) | ORAL | 0 refills | Status: DC | PRN

## 2018-10-15 MED ORDER — IPRATROPIUM-ALBUTEROL 20-100 MCG/ACT IN AERS: 1.0000 | INHALATION_SPRAY | Freq: Four times a day (QID) | RESPIRATORY_TRACT | 0 refills | Status: DC | PRN

## 2018-10-15 MED ORDER — TRAMADOL HCL 50 MG PO TABS
50.0000 mg | ORAL_TABLET | Freq: Once | ORAL | Status: AC
  Administered 2018-10-15: 06:00:00 50 mg via ORAL
  Filled 2018-10-15: qty 1

## 2018-10-15 MED ORDER — TRAZODONE HCL 100 MG PO TABS: 100.0000 mg | ORAL_TABLET | Freq: Every evening | ORAL | 0 refills | Status: DC | PRN

## 2018-10-15 MED ORDER — CITALOPRAM HYDROBROMIDE 20 MG PO TABS: 20.0000 mg | ORAL_TABLET | Freq: Every day | ORAL | 0 refills | Status: DC

## 2018-10-15 MED ORDER — ALUM & MAG HYDROXIDE-SIMETH 200-200-20 MG/5ML PO SUSP
30.0000 mL | Freq: Once | ORAL | Status: AC
  Administered 2018-10-15: 30 mL via ORAL
  Filled 2018-10-15: qty 30

## 2018-10-15 MED ORDER — PANTOPRAZOLE SODIUM 40 MG PO TBEC
40.0000 mg | DELAYED_RELEASE_TABLET | Freq: Once | ORAL | Status: AC
  Administered 2018-10-15: 40 mg via ORAL
  Filled 2018-10-15: qty 1

## 2018-10-15 MED ORDER — TIOTROPIUM BROMIDE MONOHYDRATE 18 MCG IN CAPS: 18.0000 ug | ORAL_CAPSULE | Freq: Every day | RESPIRATORY_TRACT | 0 refills | Status: DC

## 2018-10-15 MED ORDER — LIDOCAINE VISCOUS HCL 2 % MT SOLN
15.0000 mL | Freq: Once | OROMUCOSAL | Status: AC
  Administered 2018-10-15: 15 mL via ORAL
  Filled 2018-10-15: qty 15

## 2018-10-15 MED ORDER — PANTOPRAZOLE SODIUM 40 MG PO TBEC: 40.0000 mg | DELAYED_RELEASE_TABLET | Freq: Every day | ORAL | 0 refills | Status: DC

## 2018-10-15 MED ORDER — GABAPENTIN 400 MG PO CAPS: 800.0000 mg | ORAL_CAPSULE | Freq: Three times a day (TID) | ORAL | 0 refills | Status: DC

## 2018-10-15 MED ORDER — SODIUM CHLORIDE 0.9% FLUSH
3.0000 mL | Freq: Once | INTRAVENOUS | Status: AC
  Administered 2018-10-15: 3 mL via INTRAVENOUS

## 2018-10-15 MED ORDER — SODIUM CHLORIDE 0.9 % IV BOLUS
1000.0000 mL | Freq: Once | INTRAVENOUS | Status: AC
  Administered 2018-10-15: 1000 mL via INTRAVENOUS

## 2018-10-15 MED ORDER — FOLIC ACID 1 MG PO TABS: 1.0000 mg | ORAL_TABLET | Freq: Every day | ORAL | 0 refills | Status: DC

## 2018-10-15 MED ORDER — DILTIAZEM HCL ER COATED BEADS 120 MG PO CP24: 120.0000 mg | ORAL_CAPSULE | Freq: Every day | ORAL | 0 refills | Status: DC

## 2018-10-15 MED ORDER — MOMETASONE FURO-FORMOTEROL FUM 100-5 MCG/ACT IN AERO: 2.0000 | INHALATION_SPRAY | Freq: Two times a day (BID) | RESPIRATORY_TRACT | 0 refills | Status: DC

## 2018-10-15 NOTE — ED Notes (Signed)
Pt ambulatory to and from hallway bathroom without difficulty or need for assistance.

## 2018-10-15 NOTE — Discharge Instructions (Addendum)
Take acetaminophen for pain.   Do not take ibuprofen or naproxen, because they can irritate your stomach.  You will probably need to get established with a pain management clinic.
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
## 2018-10-15 NOTE — ED Notes (Signed)
Pt given discharge instructions, follow up information and prescriptions. Pt given the opportunity to ask questions. Armband removed by ED staff. Pt discharged from the ED without incident.
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
## 2018-12-12 ENCOUNTER — Inpatient Hospital Stay (HOSPITAL_COMMUNITY)
Admission: EM | Admit: 2018-12-12 | Discharge: 2018-12-15 | DRG: 918 | Disposition: A | Discharge status: Home / Self Care | Payer: Medicaid Other | Attending: Family Medicine | Admitting: Family Medicine

## 2018-12-12 ENCOUNTER — Other Ambulatory Visit: Payer: Self-pay

## 2018-12-12 ENCOUNTER — Encounter (HOSPITAL_COMMUNITY): Payer: Self-pay

## 2018-12-12 DIAGNOSIS — F1024 Alcohol dependence with alcohol-induced mood disorder: Secondary | ICD-10-CM | POA: Diagnosis not present

## 2018-12-12 DIAGNOSIS — R030 Elevated blood-pressure reading, without diagnosis of hypertension: Secondary | ICD-10-CM | POA: Diagnosis present

## 2018-12-12 DIAGNOSIS — F419 Anxiety disorder, unspecified: Secondary | ICD-10-CM

## 2018-12-12 DIAGNOSIS — Z915 Personal history of self-harm: Secondary | ICD-10-CM

## 2018-12-12 DIAGNOSIS — T43222A Poisoning by selective serotonin reuptake inhibitors, intentional self-harm, initial encounter: Principal | ICD-10-CM | POA: Diagnosis present

## 2018-12-12 DIAGNOSIS — F3132 Bipolar disorder, current episode depressed, moderate: Secondary | ICD-10-CM | POA: Diagnosis present

## 2018-12-12 DIAGNOSIS — D72829 Elevated white blood cell count, unspecified: Secondary | ICD-10-CM | POA: Diagnosis present

## 2018-12-12 DIAGNOSIS — Y92099 Unspecified place in other non-institutional residence as the place of occurrence of the external cause: Secondary | ICD-10-CM

## 2018-12-12 DIAGNOSIS — F10230 Alcohol dependence with withdrawal, uncomplicated: Secondary | ICD-10-CM | POA: Diagnosis present

## 2018-12-12 DIAGNOSIS — T1491XA Suicide attempt, initial encounter: Secondary | ICD-10-CM | POA: Diagnosis present

## 2018-12-12 DIAGNOSIS — T50902A Poisoning by unspecified drugs, medicaments and biological substances, intentional self-harm, initial encounter: Secondary | ICD-10-CM

## 2018-12-12 DIAGNOSIS — Y906 Blood alcohol level of 120-199 mg/100 ml: Secondary | ICD-10-CM | POA: Diagnosis present

## 2018-12-12 DIAGNOSIS — E860 Dehydration: Secondary | ICD-10-CM | POA: Diagnosis present

## 2018-12-12 DIAGNOSIS — F4310 Post-traumatic stress disorder, unspecified: Secondary | ICD-10-CM | POA: Diagnosis present

## 2018-12-12 DIAGNOSIS — Z91411 Personal history of adult psychological abuse: Secondary | ICD-10-CM

## 2018-12-12 DIAGNOSIS — Z91410 Personal history of adult physical and sexual abuse: Secondary | ICD-10-CM

## 2018-12-12 DIAGNOSIS — F10229 Alcohol dependence with intoxication, unspecified: Secondary | ICD-10-CM | POA: Diagnosis present

## 2018-12-12 DIAGNOSIS — F329 Major depressive disorder, single episode, unspecified: Secondary | ICD-10-CM

## 2018-12-12 DIAGNOSIS — Z1159 Encounter for screening for other viral diseases: Secondary | ICD-10-CM

## 2018-12-12 DIAGNOSIS — F10929 Alcohol use, unspecified with intoxication, unspecified: Secondary | ICD-10-CM

## 2018-12-12 DIAGNOSIS — T50901A Poisoning by unspecified drugs, medicaments and biological substances, accidental (unintentional), initial encounter: Secondary | ICD-10-CM | POA: Diagnosis present

## 2018-12-12 DIAGNOSIS — Z823 Family history of stroke: Secondary | ICD-10-CM

## 2018-12-12 DIAGNOSIS — J449 Chronic obstructive pulmonary disease, unspecified: Secondary | ICD-10-CM | POA: Diagnosis present

## 2018-12-12 DIAGNOSIS — G629 Polyneuropathy, unspecified: Secondary | ICD-10-CM | POA: Diagnosis present

## 2018-12-12 DIAGNOSIS — G8929 Other chronic pain: Secondary | ICD-10-CM | POA: Diagnosis present

## 2018-12-12 DIAGNOSIS — E876 Hypokalemia: Secondary | ICD-10-CM

## 2018-12-12 DIAGNOSIS — I4510 Unspecified right bundle-branch block: Secondary | ICD-10-CM | POA: Diagnosis present

## 2018-12-12 DIAGNOSIS — F1410 Cocaine abuse, uncomplicated: Secondary | ICD-10-CM | POA: Diagnosis present

## 2018-12-12 DIAGNOSIS — R45851 Suicidal ideations: Secondary | ICD-10-CM

## 2018-12-12 HISTORY — DX: Other psychoactive substance abuse, uncomplicated: F19.10

## 2018-12-12 LAB — COMPREHENSIVE METABOLIC PANEL
ALT: 24 U/L (ref 0–44)
AST: 30 U/L (ref 15–41)
Albumin: 4 g/dL (ref 3.5–5.0)
Alkaline Phosphatase: 76 U/L (ref 38–126)
Anion gap: 9 (ref 5–15)
BUN: 6 mg/dL (ref 6–20)
CO2: 23 mmol/L (ref 22–32)
Calcium: 8.5 mg/dL — ABNORMAL LOW (ref 8.9–10.3)
Chloride: 101 mmol/L (ref 98–111)
Creatinine, Ser: 0.93 mg/dL (ref 0.44–1.00)
GFR calc Af Amer: >60 mL/min (ref >60)
GFR calc non Af Amer: >60 mL/min (ref >60)
Glucose, Bld: 100 mg/dL — ABNORMAL HIGH (ref 70–99)
Potassium: 2.9 mmol/L — ABNORMAL LOW (ref 3.5–5.1)
Sodium: 133 mmol/L — ABNORMAL LOW (ref 135–145)
Total Bilirubin: 0.2 mg/dL — ABNORMAL LOW (ref 0.3–1.2)
Total Protein: 7.8 g/dL (ref 6.5–8.1)

## 2018-12-12 LAB — CBC WITH DIFFERENTIAL/PLATELET
Abs Immature Granulocytes: 0.05 10*3/uL (ref 0.00–0.07)
Basophils Absolute: 0.1 10*3/uL (ref 0.0–0.1)
Basophils Relative: 1 %
Eosinophils Absolute: 0.3 10*3/uL (ref 0.0–0.5)
Eosinophils Relative: 2 %
HCT: 45.2 % (ref 36.0–46.0)
Hemoglobin: 14.8 g/dL (ref 12.0–15.0)
Immature Granulocytes: 0 %
Lymphocytes Relative: 42 %
Lymphs Abs: 5.9 10*3/uL — ABNORMAL HIGH (ref 0.7–4.0)
MCH: 32.2 pg (ref 26.0–34.0)
MCHC: 32.7 g/dL (ref 30.0–36.0)
MCV: 98.3 fL (ref 80.0–100.0)
Monocytes Absolute: 1 10*3/uL (ref 0.1–1.0)
Monocytes Relative: 7 %
Neutro Abs: 6.7 10*3/uL (ref 1.7–7.7)
Neutrophils Relative %: 48 %
Platelets: 256 10*3/uL (ref 150–400)
RBC: 4.6 MIL/uL (ref 3.87–5.11)
RDW: 13.2 % (ref 11.5–15.5)
WBC: 14.1 10*3/uL — ABNORMAL HIGH (ref 4.0–10.5)
nRBC: 0 % (ref 0.0–0.2)

## 2018-12-12 LAB — URINALYSIS, ROUTINE W REFLEX MICROSCOPIC
Bilirubin Urine: NEGATIVE
Glucose, UA: NEGATIVE mg/dL
Hgb urine dipstick: NEGATIVE
Ketones, ur: NEGATIVE mg/dL
Leukocytes,Ua: NEGATIVE
Nitrite: NEGATIVE
Protein, ur: NEGATIVE mg/dL
Specific Gravity, Urine: 1.006 (ref 1.005–1.030)
pH: 6 (ref 5.0–8.0)

## 2018-12-12 LAB — SARS CORONAVIRUS 2 BY RT PCR (HOSPITAL ORDER, PERFORMED IN ~~LOC~~ HOSPITAL LAB): SARS Coronavirus 2: NEGATIVE

## 2018-12-12 LAB — RAPID URINE DRUG SCREEN, HOSP PERFORMED
Amphetamines: NOT DETECTED
Barbiturates: NOT DETECTED
Benzodiazepines: NOT DETECTED
Cocaine: POSITIVE — AB
Opiates: NOT DETECTED
Tetrahydrocannabinol: NOT DETECTED

## 2018-12-12 LAB — PROTIME-INR
INR: 1 (ref 0.8–1.2)
Prothrombin Time: 12.8 seconds (ref 11.4–15.2)

## 2018-12-12 LAB — MAGNESIUM: Magnesium: 2.1 mg/dL (ref 1.7–2.4)

## 2018-12-12 LAB — ACETAMINOPHEN LEVEL: Acetaminophen (Tylenol), Serum: <10 ug/mL — ABNORMAL LOW (ref 10–30)

## 2018-12-12 LAB — ETHANOL: Alcohol, Ethyl (B): 199 mg/dL — ABNORMAL HIGH (ref <10)

## 2018-12-12 LAB — SALICYLATE LEVEL: Salicylate Lvl: <7 mg/dL (ref 2.8–30.0)

## 2018-12-12 MED ORDER — POTASSIUM CHLORIDE CRYS ER 20 MEQ PO TBCR
40.0000 meq | EXTENDED_RELEASE_TABLET | Freq: Once | ORAL | Status: AC
  Administered 2018-12-12: 40 meq via ORAL
  Filled 2018-12-12: qty 2

## 2018-12-12 MED ORDER — SODIUM CHLORIDE 0.9 % IV BOLUS
1000.0000 mL | Freq: Once | INTRAVENOUS | Status: AC
  Administered 2018-12-12: 1000 mL via INTRAVENOUS

## 2018-12-12 NOTE — ED Notes (Addendum)
Pt ambulatory to the bathroom with steady gait and no assistance. A hat was placed in the toilet for urine sample. Pt flushed toilet, but when I checked for urine in the hat, the hat was completely dry. Pt stated that she couldn't urinate. Will continue to monitor

## 2018-12-12 NOTE — ED Notes (Signed)
Attempted to give report. Rn unavailable at this time.  

## 2018-12-12 NOTE — ED Notes (Signed)
Pt changed into burgundy scrubs and all belongings placed into labeled pt belonging bag and placed in appropriate cabinet. "16-18, resus A"

## 2018-12-12 NOTE — ED Notes (Signed)
Harrel Lemon (pts mother) [phone] would like to be contacted by the MD

## 2018-12-12 NOTE — ED Notes (Signed)
Pt thrashing around in the bed with her legs in the air saying "they ache they ache." Pt stated this was a chronic pain and that she deals with this pain everyday

## 2018-12-12 NOTE — ED Triage Notes (Signed)
Per ems: pt coming from hotel after calling her children stating "I'm going to be with grandma." (Grandma is deceased) Pt reports telling [employer], she took anywhere from 20-200 Citalopram but told EMS that she took 3 Citalopram. Pt A&Ox4 and ambulatory.   [employer] STATED THEY WILL TAKE PT HOME IF DISCHARGED.

## 2018-12-12 NOTE — ED Notes (Signed)
Pt thrashing around in bed and attempting to remove IV out. IV redressed.
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
## 2018-12-12 NOTE — ED Notes (Signed)
Pt stated she needed to go to the bathroom. Staff member attempted to get pt to the bathroom then pt stated "nevermind, I dont want to go anymore." Pt informed that a urine sample is needed and to let staff know when she is able to go
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
## 2018-12-12 NOTE — ED Notes (Signed)
Bed: RESA Expected date:  Expected time:  Means of arrival: Ambulance Comments: 53 yo F/ OD

## 2018-12-13 DIAGNOSIS — D72829 Elevated white blood cell count, unspecified: Secondary | ICD-10-CM | POA: Diagnosis present

## 2018-12-13 DIAGNOSIS — F1410 Cocaine abuse, uncomplicated: Secondary | ICD-10-CM | POA: Diagnosis present

## 2018-12-13 DIAGNOSIS — F1024 Alcohol dependence with alcohol-induced mood disorder: Secondary | ICD-10-CM | POA: Diagnosis present

## 2018-12-13 DIAGNOSIS — Y92099 Unspecified place in other non-institutional residence as the place of occurrence of the external cause: Secondary | ICD-10-CM | POA: Diagnosis not present

## 2018-12-13 DIAGNOSIS — Z91411 Personal history of adult psychological abuse: Secondary | ICD-10-CM | POA: Diagnosis not present

## 2018-12-13 DIAGNOSIS — E860 Dehydration: Secondary | ICD-10-CM | POA: Diagnosis present

## 2018-12-13 DIAGNOSIS — R45851 Suicidal ideations: Secondary | ICD-10-CM | POA: Diagnosis present

## 2018-12-13 DIAGNOSIS — T50902A Poisoning by unspecified drugs, medicaments and biological substances, intentional self-harm, initial encounter: Secondary | ICD-10-CM | POA: Diagnosis present

## 2018-12-13 DIAGNOSIS — G629 Polyneuropathy, unspecified: Secondary | ICD-10-CM | POA: Diagnosis present

## 2018-12-13 DIAGNOSIS — Z823 Family history of stroke: Secondary | ICD-10-CM | POA: Diagnosis not present

## 2018-12-13 DIAGNOSIS — E876 Hypokalemia: Secondary | ICD-10-CM | POA: Diagnosis present

## 2018-12-13 DIAGNOSIS — Z91410 Personal history of adult physical and sexual abuse: Secondary | ICD-10-CM | POA: Diagnosis not present

## 2018-12-13 DIAGNOSIS — T43221A Poisoning by selective serotonin reuptake inhibitors, accidental (unintentional), initial encounter: Secondary | ICD-10-CM | POA: Diagnosis not present

## 2018-12-13 DIAGNOSIS — F10230 Alcohol dependence with withdrawal, uncomplicated: Secondary | ICD-10-CM | POA: Diagnosis present

## 2018-12-13 DIAGNOSIS — Y906 Blood alcohol level of 120-199 mg/100 ml: Secondary | ICD-10-CM | POA: Diagnosis present

## 2018-12-13 DIAGNOSIS — J449 Chronic obstructive pulmonary disease, unspecified: Secondary | ICD-10-CM | POA: Diagnosis present

## 2018-12-13 DIAGNOSIS — I4510 Unspecified right bundle-branch block: Secondary | ICD-10-CM | POA: Diagnosis present

## 2018-12-13 DIAGNOSIS — F3132 Bipolar disorder, current episode depressed, moderate: Secondary | ICD-10-CM | POA: Diagnosis present

## 2018-12-13 DIAGNOSIS — F419 Anxiety disorder, unspecified: Secondary | ICD-10-CM | POA: Diagnosis not present

## 2018-12-13 DIAGNOSIS — Z915 Personal history of self-harm: Secondary | ICD-10-CM | POA: Diagnosis not present

## 2018-12-13 DIAGNOSIS — R030 Elevated blood-pressure reading, without diagnosis of hypertension: Secondary | ICD-10-CM | POA: Diagnosis present

## 2018-12-13 DIAGNOSIS — G8929 Other chronic pain: Secondary | ICD-10-CM | POA: Diagnosis present

## 2018-12-13 DIAGNOSIS — F4310 Post-traumatic stress disorder, unspecified: Secondary | ICD-10-CM | POA: Diagnosis present

## 2018-12-13 DIAGNOSIS — F10229 Alcohol dependence with intoxication, unspecified: Secondary | ICD-10-CM | POA: Diagnosis present

## 2018-12-13 DIAGNOSIS — T43222A Poisoning by selective serotonin reuptake inhibitors, intentional self-harm, initial encounter: Secondary | ICD-10-CM | POA: Diagnosis present

## 2018-12-13 DIAGNOSIS — Z1159 Encounter for screening for other viral diseases: Secondary | ICD-10-CM | POA: Diagnosis not present

## 2018-12-13 DIAGNOSIS — F329 Major depressive disorder, single episode, unspecified: Secondary | ICD-10-CM | POA: Diagnosis not present

## 2018-12-13 LAB — CBC
HCT: 44.4 % (ref 36.0–46.0)
Hemoglobin: 14.4 g/dL (ref 12.0–15.0)
MCH: 31.9 pg (ref 26.0–34.0)
MCHC: 32.4 g/dL (ref 30.0–36.0)
MCV: 98.2 fL (ref 80.0–100.0)
Platelets: 238 10*3/uL (ref 150–400)
RBC: 4.52 MIL/uL (ref 3.87–5.11)
RDW: 13.2 % (ref 11.5–15.5)
WBC: 11.6 10*3/uL — ABNORMAL HIGH (ref 4.0–10.5)
nRBC: 0 % (ref 0.0–0.2)

## 2018-12-13 LAB — BASIC METABOLIC PANEL
Anion gap: 7 (ref 5–15)
BUN: 7 mg/dL (ref 6–20)
CO2: 22 mmol/L (ref 22–32)
Calcium: 8.5 mg/dL — ABNORMAL LOW (ref 8.9–10.3)
Chloride: 109 mmol/L (ref 98–111)
Creatinine, Ser: 0.85 mg/dL (ref 0.44–1.00)
GFR calc Af Amer: >60 mL/min (ref >60)
GFR calc non Af Amer: >60 mL/min (ref >60)
Glucose, Bld: 87 mg/dL (ref 70–99)
Potassium: 4.2 mmol/L (ref 3.5–5.1)
Sodium: 138 mmol/L (ref 135–145)

## 2018-12-13 LAB — HEPATIC FUNCTION PANEL
ALT: 23 U/L (ref 0–44)
AST: 27 U/L (ref 15–41)
Albumin: 3.6 g/dL (ref 3.5–5.0)
Alkaline Phosphatase: 72 U/L (ref 38–126)
Bilirubin, Direct: 0.1 mg/dL (ref 0.0–0.2)
Indirect Bilirubin: 0.2 mg/dL — ABNORMAL LOW (ref 0.3–0.9)
Total Bilirubin: 0.3 mg/dL (ref 0.3–1.2)
Total Protein: 7.2 g/dL (ref 6.5–8.1)

## 2018-12-13 MED ORDER — IPRATROPIUM-ALBUTEROL 20-100 MCG/ACT IN AERS: 1.0000 | INHALATION_SPRAY | Freq: Four times a day (QID) | RESPIRATORY_TRACT | Status: DC | PRN

## 2018-12-13 MED ORDER — DILTIAZEM HCL ER COATED BEADS 120 MG PO CP24
120.0000 mg | ORAL_CAPSULE | Freq: Every day | ORAL | Status: DC
  Administered 2018-12-13 – 2018-12-15 (×3): 120 mg via ORAL
  Filled 2018-12-13 (×3): qty 1

## 2018-12-13 MED ORDER — IPRATROPIUM-ALBUTEROL 0.5-2.5 (3) MG/3ML IN SOLN: 3.0000 mL | Freq: Four times a day (QID) | RESPIRATORY_TRACT | Status: DC | PRN

## 2018-12-13 MED ORDER — VITAMIN B-1 100 MG PO TABS
100.0000 mg | ORAL_TABLET | Freq: Every day | ORAL | Status: DC
  Administered 2018-12-13 – 2018-12-15 (×3): 100 mg via ORAL
  Filled 2018-12-13 (×3): qty 1

## 2018-12-13 MED ORDER — CHLORDIAZEPOXIDE HCL 25 MG PO CAPS
25.0000 mg | ORAL_CAPSULE | Freq: Three times a day (TID) | ORAL | Status: AC
  Administered 2018-12-13 – 2018-12-14 (×6): 25 mg via ORAL
  Filled 2018-12-13 (×6): qty 1

## 2018-12-13 MED ORDER — SODIUM CHLORIDE 0.9 % IV SOLN
INTRAVENOUS | Status: AC
  Administered 2018-12-13 (×2): via INTRAVENOUS

## 2018-12-13 MED ORDER — LORAZEPAM 2 MG/ML IJ SOLN
1.0000 mg | Freq: Four times a day (QID) | INTRAMUSCULAR | Status: DC | PRN
  Filled 2018-12-13: qty 1

## 2018-12-13 MED ORDER — CLONIDINE HCL 0.1 MG PO TABS: 0.1000 mg | ORAL_TABLET | Freq: Four times a day (QID) | ORAL | Status: DC | PRN

## 2018-12-13 MED ORDER — POTASSIUM CHLORIDE CRYS ER 20 MEQ PO TBCR
40.0000 meq | EXTENDED_RELEASE_TABLET | Freq: Once | ORAL | Status: AC
  Administered 2018-12-13: 40 meq via ORAL
  Filled 2018-12-13: qty 2

## 2018-12-13 MED ORDER — MOMETASONE FURO-FORMOTEROL FUM 100-5 MCG/ACT IN AERO
2.0000 | INHALATION_SPRAY | Freq: Two times a day (BID) | RESPIRATORY_TRACT | Status: DC
  Administered 2018-12-13 – 2018-12-14 (×3): 2 via RESPIRATORY_TRACT
  Filled 2018-12-13: qty 8.8

## 2018-12-13 MED ORDER — ALBUTEROL SULFATE (2.5 MG/3ML) 0.083% IN NEBU: 3.0000 mL | INHALATION_SOLUTION | Freq: Four times a day (QID) | RESPIRATORY_TRACT | Status: DC | PRN

## 2018-12-13 MED ORDER — HYDRALAZINE HCL 20 MG/ML IJ SOLN: 10.0000 mg | Freq: Four times a day (QID) | INTRAMUSCULAR | Status: DC | PRN

## 2018-12-13 MED ORDER — THIAMINE HCL 100 MG/ML IJ SOLN: 100.0000 mg | Freq: Every day | INTRAMUSCULAR | Status: DC

## 2018-12-13 MED ORDER — ENOXAPARIN SODIUM 40 MG/0.4ML ~~LOC~~ SOLN
40.0000 mg | SUBCUTANEOUS | Status: DC
  Administered 2018-12-13: 13:00:00 40 mg via SUBCUTANEOUS
  Filled 2018-12-13: qty 0.4

## 2018-12-13 MED ORDER — LORAZEPAM 1 MG PO TABS
1.0000 mg | ORAL_TABLET | Freq: Four times a day (QID) | ORAL | Status: DC | PRN
  Administered 2018-12-13 – 2018-12-15 (×9): 1 mg via ORAL
  Filled 2018-12-13 (×9): qty 1

## 2018-12-13 MED ORDER — UMECLIDINIUM BROMIDE 62.5 MCG/INH IN AEPB
1.0000 | INHALATION_SPRAY | Freq: Every day | RESPIRATORY_TRACT | Status: DC
  Administered 2018-12-13: 1 via RESPIRATORY_TRACT
  Filled 2018-12-13: qty 7

## 2018-12-13 MED ORDER — ADULT MULTIVITAMIN W/MINERALS CH
1.0000 | ORAL_TABLET | Freq: Every day | ORAL | Status: DC
  Administered 2018-12-13 – 2018-12-15 (×3): 1 via ORAL
  Filled 2018-12-13 (×3): qty 1

## 2018-12-13 MED ORDER — PANTOPRAZOLE SODIUM 40 MG PO TBEC
40.0000 mg | DELAYED_RELEASE_TABLET | Freq: Every day | ORAL | Status: DC
  Administered 2018-12-13 – 2018-12-15 (×3): 40 mg via ORAL
  Filled 2018-12-13 (×3): qty 1

## 2018-12-13 MED ORDER — MOMETASONE FURO-FORMOTEROL FUM 100-5 MCG/ACT IN AERO
2.0000 | INHALATION_SPRAY | Freq: Two times a day (BID) | RESPIRATORY_TRACT | Status: DC
  Administered 2018-12-13: 2 via RESPIRATORY_TRACT
  Filled 2018-12-13: qty 8.8

## 2018-12-13 MED ORDER — FOLIC ACID 1 MG PO TABS
1.0000 mg | ORAL_TABLET | Freq: Every day | ORAL | Status: DC
  Administered 2018-12-13 – 2018-12-15 (×3): 1 mg via ORAL
  Filled 2018-12-13 (×3): qty 1

## 2018-12-13 NOTE — Progress Notes (Addendum)
PROGRESS NOTE    Charlotte DuttonMelanie Kadar  NUU:[id number] DOB: 10/01/1965 DOA: 12/12/2018 PCP: Patient, No Pcp Per   Brief Narrative:  Charlotte Fisher is a 53 y.o. female with history of depression/bipolar disorder, polysubstance abuse including alcohol and cocaine, COPD was brought to the ER after patient had called her family and stated that she was planning to go to her mom with intention of committing suicide.  Patient had taken an unknown number of citalopram, approximately 30 tablets of 20 mg each.  Patient otherwise states that she has ran out off all other medication for last 3 weeks.  Patient had no complaint upon arrival to the emergency department.  She also drinks almost 3 beers of 24 hours each daily for the last several years. Her alcohol level was 199 in the emergency department.  Mild leukocytosis.  She also had prolonged QTC.  She was admitted under hospitalist service due to suicide attempt and possible alcohol withdrawal.  Consultants:   None  Procedures:   None  Antimicrobials:   None   Subjective: Patient seen and examined.  She was very anxious and jittery and kept scratching herself during the whole encounter.  She was alert and oriented x3.  She told me that she has tried to commit suicide in the past as well and try to do the same by taking approximately 30 pills of citalopram.  She also drank alcohol.  Objective: Vitals:   12/13/18 0100 12/13/18 0536 12/13/18 0900 12/13/18 0912  BP: 111/69 (!) 156/98 (!) 148/90   Pulse: 82 85 93 89  Resp:  19    Temp:  98.6 F (37 C)  98.2 F (36.8 C)  TempSrc:  Oral  Oral  SpO2:  94%  94%  Weight:      Height:        Intake/Output Summary (Last 24 hours) at 12/13/2018 1047 Last data filed at 12/13/2018 0618 Gross per 24 hour  Intake 682.67 ml  Output -  Net 682.67 ml   Filed Weights   12/12/18 2004  Weight: 86.2 kg    Examination:  General exam: Appears anxious and jittery and keeps scratching herself Respiratory  system: Clear to auscultation. Respiratory effort normal. Cardiovascular system: S1 & S2 heard, RRR. No JVD, murmurs, rubs, gallops or clicks. No pedal edema. Gastrointestinal system: Abdomen is nondistended, soft and nontender. No organomegaly or masses felt. Normal bowel sounds heard. Central nervous system: Alert and oriented. No focal neurological deficits. Extremities: Symmetric 5 x 5 power. Skin: No rashes, lesions or ulcers Psychiatry: Judgement and insight appear poor. Mood & affect flat   Data Reviewed: I have personally reviewed following labs and imaging studies  CBC: Recent Labs  Lab 12/12/18 2034 12/13/18 0545  WBC 14.1* 11.6*  NEUTROABS 6.7  --   HGB 14.8 14.4  HCT 45.2 44.4  MCV 98.3 98.2  PLT 256 238   Basic Metabolic Panel: Recent Labs  Lab 12/12/18 2034 12/13/18 0545  NA 133* 138  K 2.9* 4.2  CL 101 109  CO2 23 22  GLUCOSE 100* 87  BUN 6 7  CREATININE 0.93 0.85  CALCIUM 8.5* 8.5*  MG 2.1  --    GFR: Estimated Creatinine Clearance: 77.9 mL/min (by C-G formula based on SCr of 0.85 mg/dL). Liver Function Tests: Recent Labs  Lab 12/12/18 2034 12/13/18 0545  AST 30 27  ALT 24 23  ALKPHOS 76 72  BILITOT 0.2* 0.3  PROT 7.8 7.2  ALBUMIN 4.0 3.6   No results  for input(s): LIPASE, AMYLASE in the last 168 hours. No results for input(s): AMMONIA in the last 168 hours. Coagulation Profile: Recent Labs  Lab 12/12/18 2034  INR 1.0   Cardiac Enzymes: No results for input(s): CKTOTAL, CKMB, CKMBINDEX, TROPONINI in the last 168 hours. BNP (last 3 results) No results for input(s): PROBNP in the last 8760 hours. HbA1C: No results for input(s): HGBA1C in the last 72 hours. CBG: No results for input(s): GLUCAP in the last 168 hours. Lipid Profile: No results for input(s): CHOL, HDL, LDLCALC, TRIG, CHOLHDL, LDLDIRECT in the last 72 hours. Thyroid Function Tests: No results for input(s): TSH, T4TOTAL, FREET4, T3FREE, THYROIDAB in the last 72 hours.  Anemia Panel: No results for input(s): VITAMINB12, FOLATE, FERRITIN, TIBC, IRON, RETICCTPCT in the last 72 hours. Sepsis Labs: No results for input(s): PROCALCITON, LATICACIDVEN in the last 168 hours.  Recent Results (from the past 240 hour(s))  SARS Coronavirus 2 (CEPHEID - Performed in [place] hospital lab), Hosp Order     Status: None   Collection Time: 12/12/18  9:22 PM   Specimen: Nasopharyngeal Swab  Result Value Ref Range Status   SARS Coronavirus 2 NEGATIVE NEGATIVE Final    Comment: (NOTE) If result is NEGATIVE SARS-CoV-2 target nucleic acids are NOT DETECTED. The SARS-CoV-2 RNA is generally detectable in upper and lower  respiratory specimens during the acute phase of infection. The lowest  concentration of SARS-CoV-2 viral copies this assay can detect is 250  copies / mL. A negative result does not preclude SARS-CoV-2 infection  and should not be used as the sole basis for treatment or other  patient management decisions.  A negative result may occur with  improper specimen collection / handling, submission of specimen other  than nasopharyngeal swab, presence of viral mutation(s) within the  areas targeted by this assay, and inadequate number of viral copies  (<250 copies / mL). A negative result must be combined with clinical  observations, patient history, and epidemiological information. If result is POSITIVE SARS-CoV-2 target nucleic acids are DETECTED. The SARS-CoV-2 RNA is generally detectable in upper and lower  respiratory specimens dur ing the acute phase of infection.  Positive  results are indicative of active infection with SARS-CoV-2.  Clinical  correlation with patient history and other diagnostic information is  necessary to determine patient infection status.  Positive results do  not rule out bacterial infection or co-infection with other viruses. If result is PRESUMPTIVE POSTIVE SARS-CoV-2 nucleic acids MAY BE PRESENT.   A presumptive positive  result was obtained on the submitted specimen  and confirmed on repeat testing.  While 2019 novel coronavirus  (SARS-CoV-2) nucleic acids may be present in the submitted sample  additional confirmatory testing may be necessary for epidemiological  and / or clinical management purposes  to differentiate between  SARS-CoV-2 and other Sarbecovirus currently known to infect humans.  If clinically indicated additional testing with an alternate test  methodology [phone]) is advised. The SARS-CoV-2 RNA is generally  detectable in upper and lower respiratory sp ecimens during the acute  phase of infection. The expected result is Negative. Fact Sheet for Patients:  StrictlyIdeas.no Fact Sheet for Healthcare Providers: BankingDealers.co.za This test is not yet approved or cleared by the [place] FDA and has been authorized for detection and/or diagnosis of SARS-CoV-2 by FDA under an Emergency Use Authorization (EUA).  This EUA will remain in effect (meaning this test can be used) for the duration of the COVID-19 declaration under Section 564(b)(1) of  the Act, 21 U.S.C. section 360bbb-3(b)(1), unless the authorization is terminated or revoked sooner. Performed at [facility], [address] [place], [place] [address]       Radiology Studies: No results found.  Scheduled Meds: . chlordiazePOXIDE  25 mg Oral TID  . diltiazem  120 mg Oral Daily  . folic acid  1 mg Oral Daily  . mometasone-formoterol  2 puff Inhalation BID  . mometasone-formoterol  2 puff Inhalation BID  . multivitamin with minerals  1 tablet Oral Daily  . pantoprazole  40 mg Oral Daily  . thiamine  100 mg Oral Daily   Or  . thiamine  100 mg Intravenous Daily  . umeclidinium bromide  1 puff Inhalation Daily   Continuous Infusions: . sodium chloride 100 mL/hr at 12/13/18 0618     LOS: 0 days   Assessment & Plan:   Principal Problem:   Drug  overdose Active Problems:   COPD (chronic obstructive pulmonary disease) (HCC)   Alcohol dependence with alcohol-induced mood disorder (HCC)   Suicide attempt (HCC)   Bipolar affective disorder, currently depressed, moderate (HCC)   Suicide attempt/drug overdose: She has one-to-one sitter at this point in time which we will maintain.  She will need psychiatry evaluation once she is medically stable which she is not right now so I will hold off to consultation for now.  Alcohol withdrawal/alcohol intoxication: He seems to be having alcohol withdrawal symptoms already.  I will start her on Librium 25 mg 3 times daily for 6 doses and continue CIWA protocol with as needed Ativan and multivitamin and folic acid.  Long QT syndrome: Looking back at her previous EKGs that are available in the chart from last year, she has always had prolonged QTC likely due to being on several medications which may cause and on top of that polysubstance abuse.  Monitor on telemetry and daily EKG.  Elevated blood pressure: T of hypertension however blood pressure slightly elevated likely secondary to alcohol withdrawal.  Will place her on PRN hydralazine and clonidine.  Will resume her diltiazem.  History of COPD: Not in exacerbation.  Resume home inhalers.  Hypokalemia: Resolved.  Leukocytosis: No signs of infection.  Likely due to dehydration.  Continue gentle hydration.  DVT prophylaxis: Lovenox Code Status: Full code Family Communication: Discussed with patient and her mother Charlotte Fisher over the phone.  Answered all the questions. Disposition Plan: To be determined pending clinical improvement   Time spent: 30 minutes   Hughie Clossavi Wellington Winegarden, MD [facility] Pager [phone]  If 7PM-7AM, please contact night-coverage [email or web address] Password [email or web address] 12/13/2018, 10:47 AM

## 2018-12-13 NOTE — Progress Notes (Signed)
Pt arrived to unit via stretcher room 1520. Alert and oriented x 4, steady gait. Pt oriented to room and call bell with no complications. Initial assessment completed. Pt guide at the bedside.will continue to monitor.

## 2018-12-14 LAB — COMPREHENSIVE METABOLIC PANEL
ALT: 17 U/L (ref 0–44)
AST: 22 U/L (ref 15–41)
Albumin: 3.2 g/dL — ABNORMAL LOW (ref 3.5–5.0)
Alkaline Phosphatase: 61 U/L (ref 38–126)
Anion gap: 12 (ref 5–15)
BUN: 5 mg/dL — ABNORMAL LOW (ref 6–20)
CO2: 21 mmol/L — ABNORMAL LOW (ref 22–32)
Calcium: 8.5 mg/dL — ABNORMAL LOW (ref 8.9–10.3)
Chloride: 106 mmol/L (ref 98–111)
Creatinine, Ser: 0.77 mg/dL (ref 0.44–1.00)
GFR calc Af Amer: >60 mL/min (ref >60)
GFR calc non Af Amer: >60 mL/min (ref >60)
Glucose, Bld: 98 mg/dL (ref 70–99)
Potassium: 3.6 mmol/L (ref 3.5–5.1)
Sodium: 139 mmol/L (ref 135–145)
Total Bilirubin: 0.5 mg/dL (ref 0.3–1.2)
Total Protein: 6.5 g/dL (ref 6.5–8.1)

## 2018-12-14 LAB — CBC WITH DIFFERENTIAL/PLATELET
Abs Immature Granulocytes: 0.04 10*3/uL (ref 0.00–0.07)
Basophils Absolute: 0.1 10*3/uL (ref 0.0–0.1)
Basophils Relative: 1 %
Eosinophils Absolute: 0.1 10*3/uL (ref 0.0–0.5)
Eosinophils Relative: 1 %
HCT: 41.1 % (ref 36.0–46.0)
Hemoglobin: 13.2 g/dL (ref 12.0–15.0)
Immature Granulocytes: 0 %
Lymphocytes Relative: 19 %
Lymphs Abs: 1.8 10*3/uL (ref 0.7–4.0)
MCH: 32.4 pg (ref 26.0–34.0)
MCHC: 32.1 g/dL (ref 30.0–36.0)
MCV: 100.7 fL — ABNORMAL HIGH (ref 80.0–100.0)
Monocytes Absolute: 0.8 10*3/uL (ref 0.1–1.0)
Monocytes Relative: 8 %
Neutro Abs: 6.7 10*3/uL (ref 1.7–7.7)
Neutrophils Relative %: 71 %
Platelets: 208 10*3/uL (ref 150–400)
RBC: 4.08 MIL/uL (ref 3.87–5.11)
RDW: 13.5 % (ref 11.5–15.5)
WBC: 9.4 10*3/uL (ref 4.0–10.5)
nRBC: 0 % (ref 0.0–0.2)

## 2018-12-14 LAB — MAGNESIUM: Magnesium: 1.9 mg/dL (ref 1.7–2.4)

## 2018-12-14 MED ORDER — GABAPENTIN 400 MG PO CAPS
800.0000 mg | ORAL_CAPSULE | Freq: Three times a day (TID) | ORAL | Status: DC
  Administered 2018-12-14 – 2018-12-15 (×5): 800 mg via ORAL
  Filled 2018-12-14 (×5): qty 2

## 2018-12-14 MED ORDER — IBUPROFEN 200 MG PO TABS
400.0000 mg | ORAL_TABLET | Freq: Four times a day (QID) | ORAL | Status: DC | PRN
  Administered 2018-12-14 – 2018-12-15 (×4): 400 mg via ORAL
  Filled 2018-12-14 (×5): qty 2

## 2018-12-14 NOTE — Progress Notes (Signed)
PROGRESS NOTE    Charlotte DuttonMelanie Fisher  ZOX:[id number] DOB: 02/17/1966 DOA: 12/12/2018 PCP: Patient, No Pcp Per   Brief Narrative:  Charlotte Fisher is a 53 y.o. female with history of depression/bipolar disorder, polysubstance abuse including alcohol and cocaine, COPD was brought to the ER after patient had called her family and stated that she was planning to go to her mom with intention of committing suicide.  Patient had taken an unknown number of citalopram, approximately 30 tablets of 20 mg each.  Patient otherwise states that she has ran out off all other medication for last 3 weeks.  Patient had no complaint upon arrival to the emergency department.  She also drinks almost 3 beers of 24 hours each daily for the last several years. Her alcohol level was 199 in the emergency department.  Mild leukocytosis.  She also had prolonged QTC.  She was admitted under hospitalist service due to suicide attempt and possible alcohol withdrawal.  She has now started having significant withdrawal with gross tremors upper extremities.  Consultants:   None  Procedures:   None  Antimicrobials:   None   Subjective: Patient seen and examined.  She has no new complaint however she looks anxious and has gross tremors in upper extremities.  She is requesting to resume her gabapentin and Advil for her restless leg syndrome.  Objective: Vitals:   12/13/18 1942 12/13/18 2142 12/14/18 0607 12/14/18 0753  BP:  128/79 135/75   Pulse:  79 73   Resp:  18 18   Temp:  98.9 F (37.2 C) 98.4 F (36.9 C)   TempSrc:  Oral Oral   SpO2: 97% 94% 99% 98%  Weight:      Height:        Intake/Output Summary (Last 24 hours) at 12/14/2018 1041 Last data filed at 12/14/2018 0833 Gross per 24 hour  Intake 3504.86 ml  Output --  Net 3504.86 ml   Filed Weights   12/12/18 2004  Weight: 86.2 kg    Examination:  General exam: Appears anxious and tremulous Respiratory system: Clear to auscultation. Respiratory effort  normal. Cardiovascular system: S1 & S2 heard, RRR. No JVD, murmurs, rubs, gallops or clicks. No pedal edema. Gastrointestinal system: Abdomen is nondistended, soft and nontender. No organomegaly or masses felt. Normal bowel sounds heard. Central nervous system: Alert and oriented. No focal neurological deficits. Extremities: Symmetric 5 x 5 power. Skin: No rashes, lesions or ulcers Psychiatry: Judgement and insight appear poor. Mood & affect flat  Data Reviewed: I have personally reviewed following labs and imaging studies  CBC: Recent Labs  Lab 12/12/18 2034 12/13/18 0545  WBC 14.1* 11.6*  NEUTROABS 6.7  --   HGB 14.8 14.4  HCT 45.2 44.4  MCV 98.3 98.2  PLT 256 238   Basic Metabolic Panel: Recent Labs  Lab 12/12/18 2034 12/13/18 0545  NA 133* 138  K 2.9* 4.2  CL 101 109  CO2 23 22  GLUCOSE 100* 87  BUN 6 7  CREATININE 0.93 0.85  CALCIUM 8.5* 8.5*  MG 2.1  --    GFR: Estimated Creatinine Clearance: 77.9 mL/min (by C-G formula based on SCr of 0.85 mg/dL). Liver Function Tests: Recent Labs  Lab 12/12/18 2034 12/13/18 0545  AST 30 27  ALT 24 23  ALKPHOS 76 72  BILITOT 0.2* 0.3  PROT 7.8 7.2  ALBUMIN 4.0 3.6   No results for input(s): LIPASE, AMYLASE in the last 168 hours. No results for input(s): AMMONIA in the last 168  hours. Coagulation Profile: Recent Labs  Lab 12/12/18 2034  INR 1.0   Cardiac Enzymes: No results for input(s): CKTOTAL, CKMB, CKMBINDEX, TROPONINI in the last 168 hours. BNP (last 3 results) No results for input(s): PROBNP in the last 8760 hours. HbA1C: No results for input(s): HGBA1C in the last 72 hours. CBG: No results for input(s): GLUCAP in the last 168 hours. Lipid Profile: No results for input(s): CHOL, HDL, LDLCALC, TRIG, CHOLHDL, LDLDIRECT in the last 72 hours. Thyroid Function Tests: No results for input(s): TSH, T4TOTAL, FREET4, T3FREE, THYROIDAB in the last 72 hours. Anemia Panel: No results for input(s): VITAMINB12,  FOLATE, FERRITIN, TIBC, IRON, RETICCTPCT in the last 72 hours. Sepsis Labs: No results for input(s): PROCALCITON, LATICACIDVEN in the last 168 hours.  Recent Results (from the past 240 hour(s))  SARS Coronavirus 2 (CEPHEID - Performed in [facility]), Hosp Order     Status: None   Collection Time: 12/12/18  9:22 PM   Specimen: Nasopharyngeal Swab  Result Value Ref Range Status   SARS Coronavirus 2 NEGATIVE NEGATIVE Final    Comment: (NOTE) If result is NEGATIVE SARS-CoV-2 target nucleic acids are NOT DETECTED. The SARS-CoV-2 RNA is generally detectable in upper and lower  respiratory specimens during the acute phase of infection. The lowest  concentration of SARS-CoV-2 viral copies this assay can detect is 250  copies / mL. A negative result does not preclude SARS-CoV-2 infection  and should not be used as the sole basis for treatment or other  patient management decisions.  A negative result may occur with  improper specimen collection / handling, submission of specimen other  than nasopharyngeal swab, presence of viral mutation(s) within the  areas targeted by this assay, and inadequate number of viral copies  (<250 copies / mL). A negative result must be combined with clinical  observations, patient history, and epidemiological information. If result is POSITIVE SARS-CoV-2 target nucleic acids are DETECTED. The SARS-CoV-2 RNA is generally detectable in upper and lower  respiratory specimens dur ing the acute phase of infection.  Positive  results are indicative of active infection with SARS-CoV-2.  Clinical  correlation with patient history and other diagnostic information is  necessary to determine patient infection status.  Positive results do  not rule out bacterial infection or co-infection with other viruses. If result is PRESUMPTIVE POSTIVE SARS-CoV-2 nucleic acids MAY BE PRESENT.   A presumptive positive result was obtained on the submitted specimen  and  confirmed on repeat testing.  While 2019 novel coronavirus  (SARS-CoV-2) nucleic acids may be present in the submitted sample  additional confirmatory testing may be necessary for epidemiological  and / or clinical management purposes  to differentiate between  SARS-CoV-2 and other Sarbecovirus currently known to infect humans.  If clinically indicated additional testing with an alternate test  methodology [phone]) is advised. The SARS-CoV-2 RNA is generally  detectable in upper and lower respiratory sp ecimens during the acute  phase of infection. The expected result is Negative. Fact Sheet for Patients:  BoilerBrush.com.cyhttps://www.fda.gov/media/136312/download Fact Sheet for Healthcare Providers: https://pope.com/https://www.fda.gov/media/136313/download This test is not yet approved or cleared by the [place] FDA and has been authorized for detection and/or diagnosis of SARS-CoV-2 by FDA under an Emergency Use Authorization (EUA).  This EUA will remain in effect (meaning this test can be used) for the duration of the COVID-19 declaration under Section 564(b)(1) of the Act, 21 U.S.C. section 360bbb-3(b)(1), unless the authorization is terminated or revoked sooner. Performed at [facility], [place] [address] [place], [place] [address]       Radiology Studies: No results found.  Scheduled Meds:  chlordiazePOXIDE  25 mg Oral TID   diltiazem  120 mg Oral Daily   enoxaparin (LOVENOX) injection  40 mg Subcutaneous [id number]   folic acid  1 mg Oral Daily   gabapentin  800 mg Oral TID   mometasone-formoterol  2 puff Inhalation BID   multivitamin with minerals  1 tablet Oral Daily   pantoprazole  40 mg Oral Daily   thiamine  100 mg Oral Daily   Or   thiamine  100 mg Intravenous Daily   umeclidinium bromide  1 puff Inhalation Daily   Continuous Infusions:    LOS: 1 day   Assessment & Plan:   Principal Problem:   Drug overdose Active Problems:   COPD (chronic obstructive  pulmonary disease) (HCC)   Alcohol dependence with uncomplicated withdrawal ([place])   Alcohol dependence with alcohol-induced mood disorder ([place])   Suicide attempt ([place])   Bipolar affective disorder, currently depressed, moderate ([place])   Intentional drug overdose ([place])   Suicide attempt/drug overdose: She has one-to-one sitter at this point in time which we will maintain.  She will need psychiatry evaluation once she is medically stable which she is not right now so I will hold off to consultation for now.  Poison control has signed off.  Alcohol withdrawal/alcohol intoxication: She seems to have more tremors compared to yesterday.  Will continue Librium 25 mg 3 times daily along with CIWA scale and Ativan protocol as well as multivitamin and thiamine and folic acid.  Long QT syndrome: Looking back at her previous EKGs that are available in the chart from last year, she has always had prolonged QTC likely due to being on several medications which may cause and on top of that polysubstance abuse.  Monitor on telemetry and daily EKG. prolonging drugs.  Poison control has signed off.  Elevated blood pressure: Much better now.  Continue PRN medications.  History of COPD: Not in exacerbation.  Resume home inhalers.  Hypokalemia: Repeating CMP today.  Leukocytosis: No signs of infection.  Likely due to dehydration.  Continue gentle hydration.  Repeating CBC today.  DVT prophylaxis: Lovenox Code Status: Full code Family Communication: Discussed with patient and her mother Harrel Lemon over the phone.  Answered all the questions yesterday. Disposition Plan: To be determined pending clinical improvement.  Will likely need inpatient psych unit discharge.   Time spent: 28 minutes   Darliss Cheney, MD [facility] Pager [phone]  If 7PM-7AM, please contact night-coverage [email or web address] Password [facility] 12/14/2018, 10:41 AM

## 2018-12-15 DIAGNOSIS — F329 Major depressive disorder, single episode, unspecified: Secondary | ICD-10-CM

## 2018-12-15 DIAGNOSIS — T43221A Poisoning by selective serotonin reuptake inhibitors, accidental (unintentional), initial encounter: Secondary | ICD-10-CM

## 2018-12-15 DIAGNOSIS — F419 Anxiety disorder, unspecified: Secondary | ICD-10-CM

## 2018-12-15 DIAGNOSIS — R45851 Suicidal ideations: Secondary | ICD-10-CM

## 2018-12-15 LAB — CBC WITH DIFFERENTIAL/PLATELET
Abs Immature Granulocytes: 0.03 10*3/uL (ref 0.00–0.07)
Basophils Absolute: 0.1 10*3/uL (ref 0.0–0.1)
Basophils Relative: 1 %
Eosinophils Absolute: 0.2 10*3/uL (ref 0.0–0.5)
Eosinophils Relative: 2 %
HCT: 42.1 % (ref 36.0–46.0)
Hemoglobin: 13.2 g/dL (ref 12.0–15.0)
Immature Granulocytes: 0 %
Lymphocytes Relative: 22 %
Lymphs Abs: 2.4 10*3/uL (ref 0.7–4.0)
MCH: 32 pg (ref 26.0–34.0)
MCHC: 31.4 g/dL (ref 30.0–36.0)
MCV: 102.2 fL — ABNORMAL HIGH (ref 80.0–100.0)
Monocytes Absolute: 0.8 10*3/uL (ref 0.1–1.0)
Monocytes Relative: 7 %
Neutro Abs: 7.5 10*3/uL (ref 1.7–7.7)
Neutrophils Relative %: 68 %
Platelets: 202 10*3/uL (ref 150–400)
RBC: 4.12 MIL/uL (ref 3.87–5.11)
RDW: 13.4 % (ref 11.5–15.5)
WBC: 10.9 10*3/uL — ABNORMAL HIGH (ref 4.0–10.5)
nRBC: 0 % (ref 0.0–0.2)

## 2018-12-15 LAB — COMPREHENSIVE METABOLIC PANEL
ALT: 18 U/L (ref 0–44)
AST: 18 U/L (ref 15–41)
Albumin: 3.3 g/dL — ABNORMAL LOW (ref 3.5–5.0)
Alkaline Phosphatase: 66 U/L (ref 38–126)
Anion gap: 8 (ref 5–15)
BUN: 13 mg/dL (ref 6–20)
CO2: 23 mmol/L (ref 22–32)
Calcium: 8.7 mg/dL — ABNORMAL LOW (ref 8.9–10.3)
Chloride: 107 mmol/L (ref 98–111)
Creatinine, Ser: 0.83 mg/dL (ref 0.44–1.00)
GFR calc Af Amer: >60 mL/min (ref >60)
GFR calc non Af Amer: >60 mL/min (ref >60)
Glucose, Bld: 102 mg/dL — ABNORMAL HIGH (ref 70–99)
Potassium: 4.2 mmol/L (ref 3.5–5.1)
Sodium: 138 mmol/L (ref 135–145)
Total Bilirubin: 0.4 mg/dL (ref 0.3–1.2)
Total Protein: 6.4 g/dL — ABNORMAL LOW (ref 6.5–8.1)

## 2018-12-15 MED ORDER — ALBUTEROL SULFATE HFA 108 (90 BASE) MCG/ACT IN AERS: 1.0000 | INHALATION_SPRAY | Freq: Four times a day (QID) | RESPIRATORY_TRACT | 0 refills | Status: DC | PRN

## 2018-12-15 MED ORDER — THIAMINE HCL 100 MG PO TABS: 100.0000 mg | ORAL_TABLET | Freq: Every day | ORAL | 0 refills | Status: AC

## 2018-12-15 MED ORDER — IBUPROFEN 200 MG PO TABS: 800.0000 mg | ORAL_TABLET | Freq: Three times a day (TID) | ORAL | 0 refills | Status: AC | PRN

## 2018-12-15 MED ORDER — PAROXETINE HCL 20 MG PO TABS: 20.0000 mg | ORAL_TABLET | Freq: Every day | ORAL | 0 refills | Status: DC

## 2018-12-15 MED ORDER — TIOTROPIUM BROMIDE MONOHYDRATE 18 MCG IN CAPS: 18.0000 ug | ORAL_CAPSULE | Freq: Every day | RESPIRATORY_TRACT | 0 refills | Status: DC

## 2018-12-15 MED ORDER — LIDOCAINE 5 % EX PTCH: 1.0000 | MEDICATED_PATCH | Freq: Every day | CUTANEOUS | 0 refills | Status: DC | PRN

## 2018-12-15 MED ORDER — CHLORDIAZEPOXIDE HCL 25 MG PO CAPS: ORAL_CAPSULE | ORAL | 0 refills | Status: AC

## 2018-12-15 MED ORDER — GABAPENTIN 400 MG PO CAPS: 800.0000 mg | ORAL_CAPSULE | Freq: Three times a day (TID) | ORAL | 0 refills | Status: DC

## 2018-12-15 MED ORDER — BUDESONIDE-FORMOTEROL FUMARATE 160-4.5 MCG/ACT IN AERO: 2.0000 | INHALATION_SPRAY | Freq: Two times a day (BID) | RESPIRATORY_TRACT | 0 refills | Status: DC

## 2018-12-15 MED ORDER — LIDOCAINE 5 % EX PTCH
1.0000 | MEDICATED_PATCH | CUTANEOUS | Status: DC
  Administered 2018-12-15: 1 via TRANSDERMAL
  Filled 2018-12-15: qty 1

## 2018-12-15 MED ORDER — DICLOFENAC SODIUM 1 % TD GEL
2.0000 g | Freq: Four times a day (QID) | TRANSDERMAL | Status: DC
  Administered 2018-12-15 (×2): 2 g via TOPICAL
  Filled 2018-12-15: qty 100

## 2018-12-15 MED ORDER — TRAZODONE HCL 100 MG PO TABS: 100.0000 mg | ORAL_TABLET | Freq: Every evening | ORAL | 0 refills | Status: DC | PRN

## 2018-12-15 MED ORDER — ADULT MULTIVITAMIN W/MINERALS CH: 1.0000 | ORAL_TABLET | Freq: Every day | ORAL | 0 refills | Status: AC

## 2018-12-15 MED ORDER — HYDROXYZINE HCL 50 MG PO TABS: 100.0000 mg | ORAL_TABLET | Freq: Three times a day (TID) | ORAL | 0 refills | Status: AC | PRN

## 2018-12-15 NOTE — TOC Transition Note (Signed)
Transition of Care [facility]) - CM/SW Discharge Note   Patient Details  Name: Darris Carachure MRN: [id number] Date of Birth: 1965/11/30  [facility] CM/SW Contact:  Lynnell Catalan, RN Phone Number: 12/15/2018, 4:07 PM   Clinical Narrative:     Pt provided with Outpatient mental health resources and PCP resources. Pt living in a hotel at this time with brother. Taxi voucher provided to pt as she states she is having difficulty walking. Kasandra Knudsen also ordered and [employer] alerted of order.  Marney Doctor

## 2018-12-15 NOTE — Evaluation (Signed)
Physical Therapy Evaluation Patient Details Name: Charlotte DuttonMelanie Fisher MRN: [id number] DOB: 03/18/1966 Today's Date: 12/15/2018   History of Present Illness  53 y.o. female with history of depression/bipolar disorder, polysubstance abuse including alcohol and cocaine, COPD was brought to the ER after patient had called her family and stated that she was planning to go to her mom with intention of committing suicide.  Patient had taken an unknown number of citalopram, approximately 30 tablets of 20 mg each.  Clinical Impression  Patient evaluated by Physical Therapy with no further acute PT needs identified. All education has been completed and the patient has no further questions.  Pt with overall unsteady gait however no overt LOB ~ 160'. Denies previous/recent falls. Will benefit from use of straight cane given her baseline giat dysfunction. Pt wants RW but feel this may not be the best plan given her  LUE deformities.  See below for any follow-up Physical Therapy or equipment needs. PT is signing off. Thank you for this referral.     Follow Up Recommendations No PT follow up    Equipment Recommendations  Cane    Recommendations for Other Services       Precautions / Restrictions Precautions Precautions: Fall Precaution Comments: d/t old LLE injuries, pt  denies hx of falls Restrictions Weight Bearing Restrictions: No      Mobility  Bed Mobility Overal bed mobility: Needs Assistance Bed Mobility: Supine to Sit;Sit to Supine     Supine to sit: Supervision     General bed mobility comments: for safety only,pt i smildly impulsive, attempts to get up without footwear (has socks tied around ankle)  Transfers Overall transfer level: Needs assistance Equipment used: None Transfers: Sit to/from Stand Sit to Stand: Supervision         General transfer comment: for safety  Ambulation/Gait Ambulation/Gait assistance: Min guard Gait Distance (Feet): 160 Feet Assistive device: 1  person hand held assist;None(simulated cane, reviewed with pt to use cane R hand) Gait Pattern/deviations: Step-through pattern;Decreased stride length;Trendelenburg;Drifts right/left     General Gait Details: pt with overall unsteady gait, although without overt LOB. improved stability with HHA on R  Stairs            Wheelchair Mobility    Modified Rankin (Stroke Patients Only)       Balance Overall balance assessment: Needs assistance   Sitting balance-Leahy Scale: Good       Standing balance-Leahy Scale: Fair Standing balance comment: able to wt shift, tolerate min perturbations wtihout UE support, requires unilateral UE support for higher level balance dynamic  tasks                             Pertinent Vitals/Pain Pain Assessment: Faces Faces Pain Scale: Hurts little more Pain Location: LLE    Home Living Family/patient expects to be discharged to:: Other (Comment)(hotel) Living Arrangements: Alone   Type of Home: Other(Comment)(lives in hotel with her brother) Home Access: Stairs to enter   Secretary/administratorntrance Stairs-Number of Steps: flight, second floor hotel room Home Layout: One level Home Equipment: None      Prior Function Level of Independence: Independent         Comments: pt reports independence but difficulty     Hand Dominance        Extremity/Trunk Assessment   Upper Extremity Assessment Upper Extremity Assessment: LUE deficits/detail LUE Deficits / Details: 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    Lower Extremity Assessment  Lower Extremity Assessment: LLE deficits/detail LLE Deficits / Details: grossly WFL, hip abduction 3+/5; AROM WFL per obs       Communication      Cognition Arousal/Alertness: Awake/alert Behavior During Therapy: Anxious Overall Cognitive Status: Within Functional Limits for tasks assessed                                 General Comments: perseverating on shoes and  needing to catch a  taxi home      General Comments      Exercises     Assessment/Plan    PT Assessment Patent does not need any further PT services(would benefi tfrom HHPT however lives in a motel)  PT Problem List         PT Treatment Interventions      PT Goals (Current goals can be found in the Care Plan section)  Acute Rehab PT Goals Patient Stated Goal: to get a cab home PT Goal Formulation: All assessment and education complete, DC therapy    Frequency     Barriers to discharge        Co-evaluation               AM-PAC PT "6 Clicks" Mobility  Outcome Measure Help needed turning from your back to your side while in a flat bed without using bedrails?: None Help needed moving from lying on your back to sitting on the side of a flat bed without using bedrails?: None Help needed moving to and from a bed to a chair (including a wheelchair)?: None Help needed standing up from a chair using your arms (e.g., wheelchair or bedside chair)?: None Help needed to walk in hospital room?: A Little Help needed climbing 3-5 steps with a railing? : A Little 6 Click Score: 22    End of Session Equipment Utilized During Treatment: Gait belt Activity Tolerance: Patient tolerated treatment well Patient left: in bed;with nursing/sitter in room;with call bell/phone within reach Nurse Communication: Mobility status PT Visit Diagnosis: Unsteadiness on feet (R26.81)    Time: 1539-1600 PT Time Calculation (min) (ACUTE ONLY): 21 min   Charges:   PT Evaluation $PT Eval Low Complexity: 1 Low          Kenyon Ana, PT  Pager: [phone] Acute Rehab Dept [facility]): [phone]   12/15/2018   [facility] 12/15/2018, 4:15 PM
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
## 2018-12-15 NOTE — Discharge Summary (Addendum)
Physician Discharge Summary  Charlotte Fisher ZOX:[id number] DOB: 1965-12-26 DOA: 12/12/2018  PCP: Patient, No Pcp Per  Admit date: 12/12/2018 Discharge date: 12/15/2018  Time spent: 40 minutes  Recommendations for Outpatient Follow-up:  1. Follow up outpatient CBC/CMP 2. Follow up with psychiatry outpatient at [facility] 3. Follow up with outpatient PCP - need to follow up chronic pain, COPD, anxiety, etc   Pt called 6/19 [facility] office - called both numbers that pt gave, but no answer.  Discussed with office, if pt calls back, she'll need to follow up with pcp for further rx.  I prescribed inhalers at d/c, but apparently pt asking about nebulizer.  She'll need to follow this up with outpatient provider.  Discharge Diagnoses:  Principal Problem:   Anxiety and depression Active Problems:   COPD (chronic obstructive pulmonary disease) (HCC)   Alcohol dependence with uncomplicated withdrawal (HCC)   Alcohol dependence with alcohol-induced mood disorder (HCC)   Suicide attempt (HCC)   Bipolar affective disorder, currently depressed, moderate (HCC)   Drug overdose   Intentional drug overdose [facility])   Discharge Condition: stable  Diet recommendation: heart healthy  Filed Weights   12/12/18 2004  Weight: 86.2 kg    History of present illness:  Charlotte Fisher 53 y.o.femalewithhistory of depression/bipolar disorder, polysubstance abuse including alcohol and cocaine, COPD was brought to the ER after patient had called her family and stated that she was planning to go to her mom with intention of committing suicide. Patient had taken an unknown number of citalopram, approximately 30 tablets of 20 mg each. Patient otherwise states that she has ran out off all other medication for last 3 weeks.  Patient had no complaint upon arrival to the emergency department.  She also drinks almost 3 beers of 24 hours each daily for the last several years. Her alcohol level was 199 in the emergency department.   Mild leukocytosis.  She also had prolonged QTC.  She was admitted under hospitalist service due to suicide attempt and possible alcohol withdrawal.  She has now started having significant withdrawal with gross tremors upper extremities.  She was admitted for intentional drug overdose with suicidal ideation.  She was seen by psychiatry on 6/17 who  Noted that she did not have any evidence of imminent risk to self or others and did not meet criteria for inpatient psych admission.  She was discharged on 6/17 with refills of her psych medications.    Hospital Course:  Suicide attempt  drug overdose  Mood Disorder:  Seen by psych who noted she did not meet criteria for inpatient psych admission Per H&P, poison control recommended observe on tele for at least 24 hrs, pt with improved QTc today Will restart paxil (instructed to start this on Sunday due to recent celexa overdose), continue gabapentin, trazodone, and vistaril at discharge (2 week prescriptions of paxil, trazodone, and vistaril given recent overdose). Discussed importance of follow up with outpatient psych - she seemed willing to follow up at [facility].    Alcohol withdrawal/alcohol intoxication: D/c with short librium taper.  Most recent CIWAs low.  Withdrawal improving.   Long QT syndrome: improved on day of discharge.  Follow up outpatient intermittently with many psych meds which are QT prolonging.  Elevated blood pressure: follow outpatient, improved at discharge  History of COPD: Not in exacerbation.  Resume home inhalers.  Hypokalemia: Repeating CMP today.  Leukocytosis: No signs of infection.  Likely due to dehydration.  Continue gentle hydration.  Repeating CBC today.  Chronic Pain: will prescribe lidocaine patch at discharge  Procedures:  none  Consultations:  psych  Discharge Exam: Vitals:   12/15/18 0702 12/15/18 1313  BP: 135/88 121/80  Pulse: 64 75  Resp: 19 19  Temp: 98.4 F (36.9 C) 98.3 F (36.8  C)  SpO2: 96% 96%   C/o chronic pain Asking for tylenol 3, tramadol, something for pain Discussed no narcotics at this point, trial of lidocaine patch  Discussed with mother who was comfortable with d/c, notes her brother will stay with her Pt needed refill of many meds.  She reports that her pharmacist will fill these and that she'll be able to get these tomorrow.  General: No acute distress. Cardiovascular: Heart sounds show Charlotte Fisher regular rate, and rhythm Lungs: Clear to auscultation bilaterally  Abdomen: Soft, nontender, nondistended  Neurological: Alert and oriented 3. Moves all extremities 4. Cranial nerves II through XII grossly intact. Skin: Warm and dry. No rashes or lesions. Extremities: chronic LUE deformity Psych: anxious, seems to be splitting some as well  Discharge Instructions   Discharge Instructions    Call MD for:  difficulty breathing, headache or visual disturbances   Complete by: As directed    Call MD for:  extreme fatigue   Complete by: As directed    Call MD for:  persistant dizziness or light-headedness   Complete by: As directed    Call MD for:  persistant nausea and vomiting   Complete by: As directed    Call MD for:  redness, tenderness, or signs of infection (pain, swelling, redness, odor or green/yellow discharge around incision site)   Complete by: As directed    Call MD for:  severe uncontrolled pain   Complete by: As directed    Call MD for:  temperature >100.4   Complete by: As directed    Diet - low sodium heart healthy   Complete by: As directed    Diet - low sodium heart healthy   Complete by: As directed    Discharge instructions   Complete by: As directed    You were seen for an overdose.  Psychiatry saw you and recommended outpatient psychiatry follow up.  Please follow up with an outpatient psychiatrist regarding continuing paxil.  Please do not start the paxil until this Sunday, 6/21.  I've refilled your vistaril and trazodone for  2 weeks.  It's extremely important that you follow up with the outpatient psych clinic in [place] few days (there's [place] [facility] walk in clinic [facility]).  Please establish with Charlotte Fisher primary care doctor to follow your blood pressure and COPD and chronic pain.     I've prescribed voltaren and lidocaine patches to use as needed for some of your pain.  You'll need to follow up with your PCP for continued pain management.  Return for new, recurrent, or worsening symptoms.  Please ask your PCP to request records from this hospitalization so they know what was done and what the next steps will be.   Increase activity slowly   Complete by: As directed    Increase activity slowly   Complete by: As directed      Allergies as of 12/15/2018   No Known Allergies     Medication List    STOP taking these medications   citalopram 20 MG tablet Commonly known as: CELEXA   Ipratropium-Albuterol 20-100 MCG/ACT Aers respimat Commonly known as: COMBIVENT   mometasone-formoterol 100-5 MCG/ACT Aero Commonly known as: DULERA     TAKE these medications  albuterol 108 (90 Base) MCG/ACT inhaler Commonly known as: VENTOLIN HFA Inhale 1-2 puffs into the lungs every 6 (six) hours as needed for up to 30 days for wheezing or shortness of breath.   budesonide-formoterol 160-4.5 MCG/ACT inhaler Commonly known as: Symbicort Inhale 2 puffs into the lungs 2 (two) times Charlotte Fisher day for 30 days.   CALCIUM CARBONATE PO Take 1 tablet by mouth daily.   chlordiazePOXIDE 25 MG capsule Commonly known as: LIBRIUM Take 1 capsule (25 mg total) by mouth 2 (two) times Zahniya Zellars day for 2 days, THEN 1 capsule (25 mg total) 2 (two) times Vivian Okelley day for 1 day. Start taking on: December 15, 2018   diltiazem 120 MG 24 hr capsule Commonly known as: CARDIZEM CD Take 1 capsule (120 mg total) by mouth daily.   folic acid 1 MG tablet Commonly known as: FOLVITE Take 1 tablet (1 mg total) by mouth daily.   gabapentin 400 MG capsule Commonly known as:  NEURONTIN Take 2 capsules (800 mg total) by mouth 3 (three) times daily for 30 days. For agitation   hydrOXYzine 50 MG tablet Commonly known as: ATARAX/VISTARIL Take 2 tablets (100 mg total) by mouth every 8 (eight) hours as needed for up to 14 days for anxiety. What changed: how much to take   ibuprofen 200 MG tablet Commonly known as: ADVIL Take 4 tablets (800 mg total) by mouth every 8 (eight) hours as needed for up to 14 days for moderate pain. What changed: when to take this   lidocaine 5 % Commonly known as: LIDODERM Place 1 patch onto the skin daily as needed. Remove & Discard patch within 12 hours or as directed by MD   multivitamin with minerals Tabs tablet Take 1 tablet by mouth daily for 30 days. Start taking on: December 16, 2018   pantoprazole 40 MG tablet Commonly known as: PROTONIX Take 1 tablet (40 mg total) by mouth daily.   PARoxetine 20 MG tablet Commonly known as: PAXIL Take 1 tablet (20 mg total) by mouth daily for 14 days. (please follow up with PCP or psychiatrist for further refills) Start taking on: December 19, 2018   thiamine 100 MG tablet Take 1 tablet (100 mg total) by mouth daily for 30 days. Start taking on: December 16, 2018   tiotropium 18 MCG inhalation capsule Commonly known as: SPIRIVA Place 1 capsule (18 mcg total) into inhaler and inhale daily for 30 days. For cOPD   traZODone 100 MG tablet Commonly known as: DESYREL Take 1 tablet (100 mg total) by mouth at bedtime as needed for up to 14 days for sleep. What changed:   how much to take  when to take this  reasons to take this   VITAMIN B-12 PO Take 1 tablet by mouth daily.   VITAMIN B-6 PO Take 1 tablet by mouth daily.   VITAMIN D PO Take 1 tablet by mouth daily.            Durable Medical Equipment  (From admission, onward)         Start     Ordered   12/15/18 1633  For home use only DME Cane  Once     12/15/18 1632   12/15/18 1600  For home use only DME Cane  Once      12/15/18 1559         No Known Allergies Follow-up Information    [facility] Follow up.   Why: call for  hospital follow up appointment Contact information: [address] [place] [address] [phone]           The results of significant diagnostics from this hospitalization (including imaging, microbiology, ancillary and laboratory) are listed below for reference.    Significant Diagnostic Studies: No results found.  Microbiology: Recent Results (from the past 240 hour(s))  SARS Coronavirus 2 (CEPHEID - Performed in [place] hospital lab), Hosp Order     Status: None   Collection Time: 12/12/18  9:22 PM   Specimen: Nasopharyngeal Swab  Result Value Ref Range Status   SARS Coronavirus 2 NEGATIVE NEGATIVE Final    Comment: (NOTE) If result is NEGATIVE SARS-CoV-2 target nucleic acids are NOT DETECTED. The SARS-CoV-2 RNA is generally detectable in upper and lower  respiratory specimens during the acute phase of infection. The lowest  concentration of SARS-CoV-2 viral copies this assay can detect is 250  copies / mL. Charlotte Fisher negative result does not preclude SARS-CoV-2 infection  and should not be used as the sole basis for treatment or other  patient management decisions.  Charlotte Fisher negative result may occur with  improper specimen collection / handling, submission of specimen other  than nasopharyngeal swab, presence of viral mutation(s) within the  areas targeted by this assay, and inadequate number of viral copies  (<250 copies / mL). Charlotte Fisher negative result must be combined with clinical  observations, patient history, and epidemiological information. If result is POSITIVE SARS-CoV-2 target nucleic acids are DETECTED. The SARS-CoV-2 RNA is generally detectable in upper and lower  respiratory specimens dur ing the acute phase of infection.  Positive  results are indicative of active infection with SARS-CoV-2.  Clinical   correlation with patient history and other diagnostic information is  necessary to determine patient infection status.  Positive results do  not rule out bacterial infection or co-infection with other viruses. If result is PRESUMPTIVE POSTIVE SARS-CoV-2 nucleic acids MAY BE PRESENT.   Charlotte Fisher presumptive positive result was obtained on the submitted specimen  and confirmed on repeat testing.  While 2019 novel coronavirus  (SARS-CoV-2) nucleic acids may be present in the submitted sample  additional confirmatory testing may be necessary for epidemiological  and / or clinical management purposes  to differentiate between  SARS-CoV-2 and other Sarbecovirus currently known to infect humans.  If clinically indicated additional testing with an alternate test  methodology [phone]) is advised. The SARS-CoV-2 RNA is generally  detectable in upper and lower respiratory sp ecimens during the acute  phase of infection. The expected result is Negative. Fact Sheet for Patients:  StrictlyIdeas.no Fact Sheet for Healthcare Providers: BankingDealers.co.za This test is not yet approved or cleared by the [place] FDA and has been authorized for detection and/or diagnosis of SARS-CoV-2 by FDA under an Emergency Use Authorization (EUA).  This EUA will remain in effect (meaning this test can be used) for the duration of the COVID-19 declaration under Section 564(b)(1) of the Act, 21 U.S.C. section 360bbb-3(b)(1), unless the authorization is terminated or revoked sooner. Performed at [facility], [place] [address], [place], [place] [address]      Labs: Basic Metabolic Panel: Recent Labs  Lab 12/12/18 2034 12/13/18 0545 12/14/18 1118 12/15/18 0540  NA 133* 138 139 138  K 2.9* 4.2 3.6 4.2  CL 101 109 106 107  CO2 23 22 21* 23  GLUCOSE 100* 87 98 102*  BUN 6 7 5* 13  CREATININE 0.93 0.85 0.77 0.83  CALCIUM 8.5* 8.5*  8.5* 8.7*  MG  2.1  --  1.9  --    Liver Function Tests: Recent Labs  Lab 12/12/18 2034 12/13/18 0545 12/14/18 1118 12/15/18 0540  AST 30 27 22 18   ALT 24 23 17 18   ALKPHOS 76 72 61 66  BILITOT 0.2* 0.3 0.5 0.4  PROT 7.8 7.2 6.5 6.4*  ALBUMIN 4.0 3.6 3.2* 3.3*   No results for input(s): LIPASE, AMYLASE in the last 168 hours. No results for input(s): AMMONIA in the last 168 hours. CBC: Recent Labs  Lab 12/12/18 2034 12/13/18 0545 12/14/18 1118 12/15/18 0540  WBC 14.1* 11.6* 9.4 10.9*  NEUTROABS 6.7  --  6.7 7.5  HGB 14.8 14.4 13.2 13.2  HCT 45.2 44.4 41.1 42.1  MCV 98.3 98.2 100.7* 102.2*  PLT 256 238 208 202   Cardiac Enzymes: No results for input(s): CKTOTAL, CKMB, CKMBINDEX, TROPONINI in the last 168 hours. BNP: BNP (last 3 results) Recent Labs    04/04/18 0021  BNP 30.9    ProBNP (last 3 results) No results for input(s): PROBNP in the last 8760 hours.  CBG: No results for input(s): GLUCAP in the last 168 hours.     Signed:  Lacretia Nicksaldwell Powell MD.  [facility] 12/15/2018, 5:04 PM

## 2019-02-05 ENCOUNTER — Emergency Department (HOSPITAL_COMMUNITY): Admission: EM | Admit: 2019-02-05 | Discharge: 2019-02-05 | Discharge status: Against Medical Advice | Payer: Self-pay

## 2019-02-05 ENCOUNTER — Emergency Department (HOSPITAL_COMMUNITY)
Admission: EM | Admit: 2019-02-05 | Discharge: 2019-02-05 | Disposition: A | Discharge status: Home / Self Care | Payer: Medicaid Other | Attending: Emergency Medicine | Admitting: Emergency Medicine

## 2019-02-05 DIAGNOSIS — Z5321 Procedure and treatment not carried out due to patient leaving prior to being seen by health care provider: Secondary | ICD-10-CM | POA: Insufficient documentation

## 2019-02-05 DIAGNOSIS — R52 Pain, unspecified: Secondary | ICD-10-CM | POA: Insufficient documentation

## 2019-02-05 NOTE — ED Triage Notes (Signed)
PT BIB [facility] from home c/o of LLQ pain and diarrhea x 2 weeks. States that the pain feels like someone is punching her. Reports that all fluid is running right through her. A&Ox4.

## 2019-02-05 NOTE — ED Notes (Signed)
Pt stated she was in a lot of pain, and did not want to wait. She wanted to call a cab and go home to be in her bed. She said she would return the next day.

## 2019-02-05 NOTE — ED Notes (Addendum)
Pt ambulated out of the ED with agility. She refused to speak and say if she wanted to be seen.

## 2019-02-05 NOTE — ED Notes (Signed)
When going to get vital signs from this pt, she yelled, "I need a phone now!" I asked if I could take vital signs first, but she adamantly insisted that she needed a phone first. I noticed that she had a phone in her hand and asked if it was working or not. She replied by saying, "just close my door." While going to get a phone, she was witnessed walking out of the ED.

## 2019-02-05 NOTE — ED Notes (Signed)
For the second time tonight, patient left before I could finish triage. She ambulated out of the ED without assistance.

## 2019-02-05 NOTE — ED Notes (Signed)
Pt screaming for a phone.

## 2019-02-12 ENCOUNTER — Encounter (HOSPITAL_COMMUNITY): Payer: Self-pay | Admitting: *Deleted

## 2019-02-12 ENCOUNTER — Other Ambulatory Visit: Payer: Self-pay

## 2019-02-12 ENCOUNTER — Emergency Department (HOSPITAL_COMMUNITY)
Admission: EM | Admit: 2019-02-12 | Discharge: 2019-02-12 | Disposition: A | Discharge status: Home / Self Care | Payer: Medicaid Other | Attending: Emergency Medicine | Admitting: Emergency Medicine

## 2019-02-12 ENCOUNTER — Emergency Department (HOSPITAL_COMMUNITY): Payer: Medicaid Other

## 2019-02-12 DIAGNOSIS — B1920 Unspecified viral hepatitis C without hepatic coma: Secondary | ICD-10-CM | POA: Insufficient documentation

## 2019-02-12 DIAGNOSIS — Z79899 Other long term (current) drug therapy: Secondary | ICD-10-CM | POA: Insufficient documentation

## 2019-02-12 DIAGNOSIS — N189 Chronic kidney disease, unspecified: Secondary | ICD-10-CM | POA: Diagnosis not present

## 2019-02-12 DIAGNOSIS — F17290 Nicotine dependence, other tobacco product, uncomplicated: Secondary | ICD-10-CM | POA: Diagnosis not present

## 2019-02-12 DIAGNOSIS — J441 Chronic obstructive pulmonary disease with (acute) exacerbation: Secondary | ICD-10-CM

## 2019-02-12 DIAGNOSIS — R1013 Epigastric pain: Secondary | ICD-10-CM | POA: Diagnosis not present

## 2019-02-12 DIAGNOSIS — F419 Anxiety disorder, unspecified: Secondary | ICD-10-CM | POA: Diagnosis not present

## 2019-02-12 DIAGNOSIS — R0602 Shortness of breath: Secondary | ICD-10-CM | POA: Diagnosis present

## 2019-02-12 DIAGNOSIS — Z720 Tobacco use: Secondary | ICD-10-CM

## 2019-02-12 LAB — URINALYSIS, ROUTINE W REFLEX MICROSCOPIC
Bilirubin Urine: NEGATIVE
Glucose, UA: NEGATIVE mg/dL
Hgb urine dipstick: NEGATIVE
Ketones, ur: NEGATIVE mg/dL
Leukocytes,Ua: NEGATIVE
Nitrite: NEGATIVE
Protein, ur: NEGATIVE mg/dL
Specific Gravity, Urine: 1.013 (ref 1.005–1.030)
pH: 6 (ref 5.0–8.0)

## 2019-02-12 LAB — CBC WITH DIFFERENTIAL/PLATELET
Abs Immature Granulocytes: 0.09 10*3/uL — ABNORMAL HIGH (ref 0.00–0.07)
Basophils Absolute: 0.1 10*3/uL (ref 0.0–0.1)
Basophils Relative: 1 %
Eosinophils Absolute: 0.5 10*3/uL (ref 0.0–0.5)
Eosinophils Relative: 4 %
HCT: 41.2 % (ref 36.0–46.0)
Hemoglobin: 13.4 g/dL (ref 12.0–15.0)
Immature Granulocytes: 1 %
Lymphocytes Relative: 23 %
Lymphs Abs: 2.8 10*3/uL (ref 0.7–4.0)
MCH: 31.6 pg (ref 26.0–34.0)
MCHC: 32.5 g/dL (ref 30.0–36.0)
MCV: 97.2 fL (ref 80.0–100.0)
Monocytes Absolute: 0.6 10*3/uL (ref 0.1–1.0)
Monocytes Relative: 5 %
Neutro Abs: 8.2 10*3/uL — ABNORMAL HIGH (ref 1.7–7.7)
Neutrophils Relative %: 66 %
Platelets: 287 10*3/uL (ref 150–400)
RBC: 4.24 MIL/uL (ref 3.87–5.11)
RDW: 14.9 % (ref 11.5–15.5)
WBC: 12.3 10*3/uL — ABNORMAL HIGH (ref 4.0–10.5)
nRBC: 0 % (ref 0.0–0.2)

## 2019-02-12 LAB — COMPREHENSIVE METABOLIC PANEL
ALT: 24 U/L (ref 0–44)
AST: 32 U/L (ref 15–41)
Albumin: 3.6 g/dL (ref 3.5–5.0)
Alkaline Phosphatase: 76 U/L (ref 38–126)
Anion gap: 8 (ref 5–15)
BUN: 14 mg/dL (ref 6–20)
CO2: 23 mmol/L (ref 22–32)
Calcium: 8.8 mg/dL — ABNORMAL LOW (ref 8.9–10.3)
Chloride: 109 mmol/L (ref 98–111)
Creatinine, Ser: 0.89 mg/dL (ref 0.44–1.00)
GFR calc Af Amer: >60 mL/min (ref >60)
GFR calc non Af Amer: >60 mL/min (ref >60)
Glucose, Bld: 112 mg/dL — ABNORMAL HIGH (ref 70–99)
Potassium: 3.2 mmol/L — ABNORMAL LOW (ref 3.5–5.1)
Sodium: 140 mmol/L (ref 135–145)
Total Bilirubin: 0.6 mg/dL (ref 0.3–1.2)
Total Protein: 7.3 g/dL (ref 6.5–8.1)

## 2019-02-12 LAB — LIPASE, BLOOD: Lipase: 33 U/L (ref 11–51)

## 2019-02-12 LAB — ETHANOL: Alcohol, Ethyl (B): <10 mg/dL (ref <10)

## 2019-02-12 MED ORDER — ALBUTEROL SULFATE HFA 108 (90 BASE) MCG/ACT IN AERS
4.0000 | INHALATION_SPRAY | Freq: Once | RESPIRATORY_TRACT | Status: AC
  Administered 2019-02-12: 4 via RESPIRATORY_TRACT
  Filled 2019-02-12: qty 6.7

## 2019-02-12 MED ORDER — PREDNISONE 10 MG (21) PO TBPK: ORAL_TABLET | ORAL | 0 refills | Status: DC

## 2019-02-12 MED ORDER — BUDESONIDE-FORMOTEROL FUMARATE 160-4.5 MCG/ACT IN AERO: 2.0000 | INHALATION_SPRAY | Freq: Two times a day (BID) | RESPIRATORY_TRACT | 0 refills | Status: DC

## 2019-02-12 MED ORDER — FOLIC ACID 1 MG PO TABS: 1.0000 mg | ORAL_TABLET | Freq: Every day | ORAL | 0 refills | Status: DC

## 2019-02-12 MED ORDER — LORAZEPAM 1 MG PO TABS
1.0000 mg | ORAL_TABLET | Freq: Once | ORAL | Status: AC
  Administered 2019-02-12: 1 mg via ORAL
  Filled 2019-02-12: qty 1

## 2019-02-12 MED ORDER — PANTOPRAZOLE SODIUM 40 MG PO TBEC: 40.0000 mg | DELAYED_RELEASE_TABLET | Freq: Every day | ORAL | 0 refills | Status: DC

## 2019-02-12 MED ORDER — TRAZODONE HCL 100 MG PO TABS: 100.0000 mg | ORAL_TABLET | Freq: Every evening | ORAL | 0 refills | Status: DC | PRN

## 2019-02-12 MED ORDER — ALBUTEROL SULFATE HFA 108 (90 BASE) MCG/ACT IN AERS: 2.0000 | INHALATION_SPRAY | RESPIRATORY_TRACT | 0 refills | Status: DC | PRN

## 2019-02-12 MED ORDER — GABAPENTIN 400 MG PO CAPS: 800.0000 mg | ORAL_CAPSULE | Freq: Three times a day (TID) | ORAL | 0 refills | Status: DC

## 2019-02-12 MED ORDER — TIOTROPIUM BROMIDE MONOHYDRATE 18 MCG IN CAPS: 18.0000 ug | ORAL_CAPSULE | Freq: Every day | RESPIRATORY_TRACT | 0 refills | Status: DC

## 2019-02-12 MED ORDER — LORAZEPAM 2 MG/ML IJ SOLN: 1.0000 mg | Freq: Once | INTRAMUSCULAR | Status: DC

## 2019-02-12 NOTE — Progress Notes (Signed)
Consult request has been received. CSW attempting to follow up at present time. CSW sees that pt has been discharged, and will call to provide pt with a list of resources.  [facility]  Clinical Social Worker  Ph: [phone]

## 2019-02-12 NOTE — ED Notes (Signed)
Pt upset due to not being discharged before pharmacy closed. She is very anxious. Discharge instructions provided.
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
## 2019-02-12 NOTE — ED Notes (Signed)
PT STATES IF SHE CANNOT GET HER MEDS REFILLED SHE IS GOING TO SAY SHE IS SUICIDAL IN ORDER TO GET THEM. SHE HAS VERBALLY STATED THIS AND PLANS TO CARRY OUT THIS PLAN.

## 2019-02-12 NOTE — ED Notes (Signed)
Pt listing more and more services she wants the hospital to take care of while she is hear. Listing from ride to doctors, pick up meds, help finding a doctor then states she has found one and has an appointment at the end of Sept. Unable to determine what she truly would like from out Education officer, museum.

## 2019-02-12 NOTE — Discharge Instructions (Signed)
Try to stop smoking and drinking.

## 2019-02-12 NOTE — ED Notes (Signed)
Urine culture sent down to lab with urinalysis. 

## 2019-02-12 NOTE — ED Notes (Signed)
Spoke with SW who is aware of pts needs and concerns

## 2019-02-12 NOTE — ED Notes (Signed)
Patient is aware that urine sample is needed. Patient will call out when she has to use the restroom. 

## 2019-02-12 NOTE — ED Triage Notes (Addendum)
BIB EMS Has history of COPD  with SHOB x 3 weeks last cigarette 30 min PTA, wheezing upper, diminished at bottom, main complaint out of inhaler and pschy meds for 3 weeks. #20 Rt ac 125 mg Solumedrol. SR 101-172/p-96% RA

## 2019-03-11 ENCOUNTER — Other Ambulatory Visit: Payer: Self-pay

## 2019-03-11 ENCOUNTER — Encounter: Payer: Self-pay | Admitting: Family Medicine

## 2019-03-11 ENCOUNTER — Ambulatory Visit: Payer: Medicaid Other | Attending: Family Medicine | Admitting: Family Medicine

## 2019-03-11 DIAGNOSIS — Z7689 Persons encountering health services in other specified circumstances: Secondary | ICD-10-CM | POA: Diagnosis not present

## 2019-03-11 DIAGNOSIS — J441 Chronic obstructive pulmonary disease with (acute) exacerbation: Secondary | ICD-10-CM | POA: Diagnosis not present

## 2019-03-11 DIAGNOSIS — J449 Chronic obstructive pulmonary disease, unspecified: Secondary | ICD-10-CM

## 2019-03-11 DIAGNOSIS — F32A Depression, unspecified: Secondary | ICD-10-CM

## 2019-03-11 DIAGNOSIS — F419 Anxiety disorder, unspecified: Secondary | ICD-10-CM

## 2019-03-11 DIAGNOSIS — M2550 Pain in unspecified joint: Secondary | ICD-10-CM

## 2019-03-11 DIAGNOSIS — M545 Low back pain, unspecified: Secondary | ICD-10-CM

## 2019-03-11 DIAGNOSIS — F329 Major depressive disorder, single episode, unspecified: Secondary | ICD-10-CM

## 2019-03-11 DIAGNOSIS — G8929 Other chronic pain: Secondary | ICD-10-CM

## 2019-03-11 DIAGNOSIS — F3132 Bipolar disorder, current episode depressed, moderate: Secondary | ICD-10-CM

## 2019-03-11 DIAGNOSIS — I10 Essential (primary) hypertension: Secondary | ICD-10-CM

## 2019-03-11 DIAGNOSIS — Z09 Encounter for follow-up examination after completed treatment for conditions other than malignant neoplasm: Secondary | ICD-10-CM

## 2019-03-11 DIAGNOSIS — M1612 Unilateral primary osteoarthritis, left hip: Secondary | ICD-10-CM

## 2019-03-11 DIAGNOSIS — K219 Gastro-esophageal reflux disease without esophagitis: Secondary | ICD-10-CM

## 2019-03-11 MED ORDER — PAROXETINE HCL 20 MG PO TABS: 20.0000 mg | ORAL_TABLET | Freq: Every day | ORAL | 1 refills | Status: DC

## 2019-03-11 MED ORDER — DILTIAZEM HCL ER COATED BEADS 120 MG PO CP24: 120.0000 mg | ORAL_CAPSULE | Freq: Every day | ORAL | 0 refills | Status: DC

## 2019-03-11 MED ORDER — TIOTROPIUM BROMIDE MONOHYDRATE 18 MCG IN CAPS: 18.0000 ug | ORAL_CAPSULE | Freq: Every day | RESPIRATORY_TRACT | 3 refills | Status: DC

## 2019-03-11 MED ORDER — DOXYCYCLINE HYCLATE 100 MG PO TABS: 100.0000 mg | ORAL_TABLET | Freq: Two times a day (BID) | ORAL | 0 refills | Status: DC

## 2019-03-11 MED ORDER — ALBUTEROL SULFATE HFA 108 (90 BASE) MCG/ACT IN AERS: 1.0000 | INHALATION_SPRAY | Freq: Four times a day (QID) | RESPIRATORY_TRACT | 99 refills | Status: DC | PRN

## 2019-03-11 MED ORDER — IBUPROFEN 600 MG PO TABS: 600.0000 mg | ORAL_TABLET | Freq: Three times a day (TID) | ORAL | 0 refills | Status: DC | PRN

## 2019-03-11 MED ORDER — PREDNISONE 20 MG PO TABS: ORAL_TABLET | ORAL | 0 refills | Status: DC

## 2019-03-11 MED ORDER — HYDROXYZINE HCL 50 MG PO TABS: 50.0000 mg | ORAL_TABLET | Freq: Three times a day (TID) | ORAL | 1 refills | Status: DC

## 2019-03-11 MED ORDER — FOLIC ACID 1 MG PO TABS: 1.0000 mg | ORAL_TABLET | Freq: Every day | ORAL | 0 refills | Status: DC

## 2019-03-11 MED ORDER — BUDESONIDE-FORMOTEROL FUMARATE 160-4.5 MCG/ACT IN AERO: 2.0000 | INHALATION_SPRAY | Freq: Two times a day (BID) | RESPIRATORY_TRACT | 3 refills | Status: DC

## 2019-03-11 MED ORDER — TRAZODONE HCL 100 MG PO TABS: 100.0000 mg | ORAL_TABLET | Freq: Every evening | ORAL | 1 refills | Status: DC | PRN

## 2019-03-11 MED ORDER — COMBIVENT RESPIMAT 20-100 MCG/ACT IN AERS: 1.0000 | INHALATION_SPRAY | Freq: Four times a day (QID) | RESPIRATORY_TRACT | 5 refills | Status: DC | PRN

## 2019-03-11 MED ORDER — PANTOPRAZOLE SODIUM 40 MG PO TBEC: 40.0000 mg | DELAYED_RELEASE_TABLET | Freq: Every day | ORAL | 3 refills | Status: DC

## 2019-03-11 MED ORDER — GABAPENTIN 400 MG PO CAPS: 800.0000 mg | ORAL_CAPSULE | Freq: Three times a day (TID) | ORAL | 0 refills | Status: DC

## 2019-03-11 MED ORDER — PROMETHAZINE-DM 6.25-15 MG/5ML PO SYRP: 5.0000 mL | ORAL_SOLUTION | Freq: Four times a day (QID) | ORAL | 0 refills | Status: DC | PRN
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
## 2019-03-15 ENCOUNTER — Encounter: Payer: Self-pay | Admitting: Family Medicine

## 2019-03-16 ENCOUNTER — Ambulatory Visit: Payer: Medicaid Other | Admitting: Orthopaedic Surgery

## 2019-03-23 ENCOUNTER — Ambulatory Visit (INDEPENDENT_AMBULATORY_CARE_PROVIDER_SITE_OTHER): Payer: Medicaid Other

## 2019-03-23 ENCOUNTER — Encounter: Payer: Self-pay | Admitting: Orthopaedic Surgery

## 2019-03-23 ENCOUNTER — Ambulatory Visit (INDEPENDENT_AMBULATORY_CARE_PROVIDER_SITE_OTHER): Payer: Medicaid Other | Admitting: Orthopaedic Surgery

## 2019-03-23 ENCOUNTER — Other Ambulatory Visit: Payer: Self-pay

## 2019-03-23 VITALS — Ht 63.0 in | Wt 190.0 lb

## 2019-03-23 DIAGNOSIS — G8929 Other chronic pain: Secondary | ICD-10-CM

## 2019-03-23 DIAGNOSIS — M25562 Pain in left knee: Secondary | ICD-10-CM | POA: Diagnosis not present

## 2019-03-23 DIAGNOSIS — M79605 Pain in left leg: Secondary | ICD-10-CM | POA: Diagnosis not present

## 2019-03-23 DIAGNOSIS — M79672 Pain in left foot: Secondary | ICD-10-CM

## 2019-03-23 MED ORDER — HYDROCODONE-ACETAMINOPHEN 5-325 MG PO TABS: 1.0000 | ORAL_TABLET | Freq: Three times a day (TID) | ORAL | 0 refills | Status: DC | PRN
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
## 2019-03-28 ENCOUNTER — Telehealth: Payer: Self-pay | Admitting: Licensed Clinical Social Worker

## 2019-03-28 NOTE — Telephone Encounter (Signed)
Call placed to patient regarding [facility] referral. LCSW left message for return call.

## 2019-03-28 NOTE — Telephone Encounter (Signed)
LCSW received a return call from patient's friend. She shared pt's new number is [phone]. LCSW placed call and left message for a return call.

## 2019-03-29 ENCOUNTER — Other Ambulatory Visit: Payer: Self-pay | Admitting: Radiology

## 2019-03-31 ENCOUNTER — Other Ambulatory Visit: Payer: Self-pay

## 2019-03-31 ENCOUNTER — Encounter: Payer: Self-pay | Admitting: Family Medicine

## 2019-03-31 ENCOUNTER — Ambulatory Visit: Payer: Medicaid Other | Attending: Family Medicine | Admitting: Family Medicine

## 2019-03-31 ENCOUNTER — Ambulatory Visit (INDEPENDENT_AMBULATORY_CARE_PROVIDER_SITE_OTHER): Payer: Medicaid Other | Admitting: Family Medicine

## 2019-03-31 ENCOUNTER — Ambulatory Visit: Payer: Medicaid Other | Admitting: Licensed Clinical Social Worker

## 2019-03-31 VITALS — BP 126/87 | HR 98 | Temp 98.0°F | Ht 63.0 in | Wt 188.0 lb

## 2019-03-31 DIAGNOSIS — R609 Edema, unspecified: Secondary | ICD-10-CM

## 2019-03-31 DIAGNOSIS — F4310 Post-traumatic stress disorder, unspecified: Secondary | ICD-10-CM | POA: Diagnosis not present

## 2019-03-31 DIAGNOSIS — M79622 Pain in left upper arm: Secondary | ICD-10-CM

## 2019-03-31 DIAGNOSIS — G8929 Other chronic pain: Secondary | ICD-10-CM

## 2019-03-31 DIAGNOSIS — Z79899 Other long term (current) drug therapy: Secondary | ICD-10-CM

## 2019-03-31 DIAGNOSIS — I452 Bifascicular block: Secondary | ICD-10-CM | POA: Insufficient documentation

## 2019-03-31 DIAGNOSIS — R9431 Abnormal electrocardiogram [ECG] [EKG]: Secondary | ICD-10-CM | POA: Diagnosis not present

## 2019-03-31 DIAGNOSIS — Z01818 Encounter for other preprocedural examination: Secondary | ICD-10-CM | POA: Diagnosis present

## 2019-03-31 DIAGNOSIS — M25552 Pain in left hip: Secondary | ICD-10-CM | POA: Diagnosis not present

## 2019-03-31 DIAGNOSIS — R600 Localized edema: Secondary | ICD-10-CM

## 2019-03-31 DIAGNOSIS — J449 Chronic obstructive pulmonary disease, unspecified: Secondary | ICD-10-CM

## 2019-03-31 DIAGNOSIS — F3132 Bipolar disorder, current episode depressed, moderate: Secondary | ICD-10-CM

## 2019-03-31 DIAGNOSIS — F419 Anxiety disorder, unspecified: Secondary | ICD-10-CM

## 2019-03-31 DIAGNOSIS — M1612 Unilateral primary osteoarthritis, left hip: Secondary | ICD-10-CM

## 2019-03-31 DIAGNOSIS — M2550 Pain in unspecified joint: Secondary | ICD-10-CM

## 2019-03-31 DIAGNOSIS — R0602 Shortness of breath: Secondary | ICD-10-CM

## 2019-03-31 DIAGNOSIS — F329 Major depressive disorder, single episode, unspecified: Secondary | ICD-10-CM

## 2019-03-31 DIAGNOSIS — M5136 Other intervertebral disc degeneration, lumbar region: Secondary | ICD-10-CM

## 2019-03-31 DIAGNOSIS — B1920 Unspecified viral hepatitis C without hepatic coma: Secondary | ICD-10-CM

## 2019-03-31 DIAGNOSIS — M51369 Other intervertebral disc degeneration, lumbar region without mention of lumbar back pain or lower extremity pain: Secondary | ICD-10-CM

## 2019-03-31 DIAGNOSIS — F32A Depression, unspecified: Secondary | ICD-10-CM

## 2019-03-31 MED ORDER — ACETAMINOPHEN-CODEINE #3 300-30 MG PO TABS: 1.0000 | ORAL_TABLET | Freq: Four times a day (QID) | ORAL | 0 refills | Status: AC | PRN

## 2019-03-31 MED ORDER — HYDROXYZINE HCL 50 MG PO TABS: 50.0000 mg | ORAL_TABLET | Freq: Three times a day (TID) | ORAL | 0 refills | Status: DC

## 2019-03-31 MED ORDER — TRAZODONE HCL 100 MG PO TABS: 100.0000 mg | ORAL_TABLET | Freq: Every evening | ORAL | 0 refills | Status: DC | PRN
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
## 2019-03-31 NOTE — Patient Instructions (Signed)

## 2019-03-31 NOTE — Progress Notes (Signed)
Subjective: Patient is here for ultrasound-guided intra-articular left hip injection.   Pain in the anterior lateral hip, left knee and left foot.  We were asked to inject her hip.  Objective: She has good range of motion but pain with passive flexion and internal rotation.  Procedure: Ultrasound-guided left hip injection: After sterile prep with Betadine, injected 8 cc 1% lidocaine without epinephrine and 40 mg methylprednisolone using a 22-gauge spinal needle, passing the needle through the iliofemoral ligament into the femoral head/neck junction.  Injectate seen filling joint capsule.  Moderate improvement in pain during the anesthetic phase.  She will follow-up as directed.

## 2019-04-01 ENCOUNTER — Telehealth: Payer: Self-pay | Admitting: General Practice

## 2019-04-01 NOTE — Telephone Encounter (Signed)
Jasmine, can you recall what letter the patient mentioned to you that she needed at her recent visit

## 2019-04-01 NOTE — Telephone Encounter (Signed)
Patient called to provide the fax number to send her a letter she requested on her appointment yesterday 03/31/19. Please follow up  Mychoice [phone]

## 2019-04-01 NOTE — Telephone Encounter (Signed)
Letter has been completed. Please call patient to see if she would like to pick-up letter or have letter mailed

## 2019-04-01 NOTE — Telephone Encounter (Signed)
Attempted to reach patient to inform letter is ready for pickup no answer unable to lvm

## 2019-04-01 NOTE — Telephone Encounter (Signed)
Pt would like a letter stating that she has an upcoming surgery

## 2019-04-04 ENCOUNTER — Telehealth: Payer: Self-pay | Admitting: Orthopaedic Surgery

## 2019-04-04 NOTE — Telephone Encounter (Signed)
Please fax [facility] [phone]

## 2019-04-04 NOTE — Telephone Encounter (Signed)
Patient called requesting an RX refill on her Norco.  She stated that she is in severe pain.  She is requesting the RX go to [facility] on [address].  She also requests that someone call her when the RX is called into the pharmacy.  CB#[phone] ext. [phone].  Thank you.

## 2019-04-04 NOTE — Telephone Encounter (Signed)
See message below °

## 2019-04-05 ENCOUNTER — Ambulatory Visit: Payer: Medicaid Other | Admitting: Orthopaedic Surgery

## 2019-04-05 NOTE — Telephone Encounter (Signed)
Letter has been faxed over to number provided. 

## 2019-04-05 NOTE — Telephone Encounter (Signed)
Called patient to advise on message below. She states her PCP- Dr. Chapman Fitch does not prescribe any of these Rx. She was upset.

## 2019-04-05 NOTE — Telephone Encounter (Signed)
Letter was faxed to number patient is requesting

## 2019-04-05 NOTE — Telephone Encounter (Signed)
Cannot refill narcotic.  Needs to get pain meds from pcp at this time

## 2019-04-07 ENCOUNTER — Ambulatory Visit: Payer: Medicaid Other | Admitting: Orthopaedic Surgery

## 2019-04-07 NOTE — BH Specialist Note (Signed)
Integrated Behavioral Health Initial Visit  MRN: [id number] Name: Charlotte Fisher  Number of [facility] Clinician visits:: 1/6 Session Start time: 2:50pm  Session End time: 3:30pm Total time: 40 minutes  Type of Service: [place] Interpretor:No. Interpretor Name and Language: N/A   Warm Hand Off Completed.       SUBJECTIVE: Charlotte Fisher is a 53 y.o. female accompanied by self Patient was referred by Dr. Chapman Fitch for symptoms of depression and anxiety. Patient reports the following symptoms/concerns: anxiety caused by chronic pain, lack of transportation, and financial strain. Duration of problem: ongoing; Severity of problem: severe   OBJECTIVE: Mood: Anxious and Affect: Appropriate Risk of harm to self or others: No plan to harm self or others  LIFE CONTEXT: Family and Social: Pt's family members (mother and sibling) reside in [place], [place]. Pt lives in a motel with grandson. Pt is unhappy with current living conditions. School/Work: Pt receives disability checks (~600/per month) Self-Care: Pt drinks small amounts of alcohol to calm her down. Reports she has a hx of substance use 7 years ago and alcohol dependence 3 years ago. Life Changes: Pt recently moved back to [place] from [place], [place] where her mother lives. Pt reports ongoing chronic pain.  Strengths: Pt is open to medication management Pt advocates for herself Pt identified healthy coping skills to manage chronic pain  GOALS ADDRESSED: Patient will: 1. Reduce symptoms of: anxiety and stress 2. Increase knowledge and/or ability of: coping skills  3. Demonstrate ability to: Increase adequate support systems for patient/family  INTERVENTIONS: Interventions utilized: Supportive Counseling and Link to [employer]  Standardized Assessments completed: not provided  ASSESSMENT: Patient currently experiencing symptoms of anxiety due to chronic pain  reported as constant. Patient reports difficulty sleeping and experiences panic attacks. Pt reports that she has taken Hydroxyzine for 5+ years to manage intense bouts of anxiety and it has worked well to manage her panic attacks. Pt wishes to continue this medication. Pt denies current SI/HI. Patient reports suicide attempt 6 months ago via overdosing on Paxil. LCSW performed a risk assessment and identified protective factors. Crisis intervention resources provided.     Patient may benefit from engagement with community resources. LCSW provided patient with contact information for [employer] and encouraged her to ask for a print out of available apartments to improve current living conditions. LCSW provided patient with a SCAT application to address transportation barriers.   PLAN: 1. Follow up with behavioral health clinician on : LCSW encouraged patient to reach out if further community resources are needed or if patient is interested in additional behavioral health support beyond medication management. 2. Behavioral recommendations: LCSW encouraged patient to continue using healthy coping skills, contact [employer], and to apply for SCAT.  3. Referral(s): Community Resources:  Housing and Transportation 4. "From scale of 1-10, how likely are you to follow plan?":   [place] Intern 04/07/19 4:55pm

## 2019-04-08 ENCOUNTER — Other Ambulatory Visit: Payer: Self-pay | Admitting: Family Medicine

## 2019-04-08 DIAGNOSIS — G8929 Other chronic pain: Secondary | ICD-10-CM

## 2019-04-08 DIAGNOSIS — F3132 Bipolar disorder, current episode depressed, moderate: Secondary | ICD-10-CM

## 2019-04-08 DIAGNOSIS — I10 Essential (primary) hypertension: Secondary | ICD-10-CM

## 2019-04-08 DIAGNOSIS — F32A Depression, unspecified: Secondary | ICD-10-CM

## 2019-04-08 DIAGNOSIS — F329 Major depressive disorder, single episode, unspecified: Secondary | ICD-10-CM

## 2019-04-10 ENCOUNTER — Encounter: Payer: Self-pay | Admitting: Family Medicine

## 2019-04-14 ENCOUNTER — Other Ambulatory Visit: Payer: Self-pay | Admitting: Family Medicine

## 2019-04-14 DIAGNOSIS — M1612 Unilateral primary osteoarthritis, left hip: Secondary | ICD-10-CM

## 2019-04-18 ENCOUNTER — Telehealth: Payer: Self-pay | Admitting: Licensed Clinical Social Worker

## 2019-04-18 NOTE — Telephone Encounter (Signed)
She may wish to be seen at urgent care or emergency department in follow-up of pain or call her orthopedic specialist.  She should again try to reschedule with pain clinic

## 2019-04-18 NOTE — Telephone Encounter (Signed)
Return call placed to patient. Patient reports that she missed her scheduled appointment with the pain clinic. She has left messages with their front desk staff to re-schedule; however, reports being in "a lot of pain"  Pt is requesting prescription of Tylenol 3. Please advise.

## 2019-04-20 ENCOUNTER — Telehealth: Payer: Self-pay | Admitting: Family Medicine

## 2019-04-20 ENCOUNTER — Ambulatory Visit (INDEPENDENT_AMBULATORY_CARE_PROVIDER_SITE_OTHER): Payer: Medicaid Other | Admitting: Family Medicine

## 2019-04-20 ENCOUNTER — Other Ambulatory Visit: Payer: Self-pay

## 2019-04-20 ENCOUNTER — Encounter: Payer: Self-pay | Admitting: Family Medicine

## 2019-04-20 DIAGNOSIS — M25552 Pain in left hip: Secondary | ICD-10-CM | POA: Diagnosis not present

## 2019-04-20 MED ORDER — HYDROCODONE-ACETAMINOPHEN 5-325 MG PO TABS: 1.0000 | ORAL_TABLET | Freq: Two times a day (BID) | ORAL | 0 refills | Status: AC | PRN

## 2019-04-20 NOTE — Telephone Encounter (Signed)
Call placed to patient to follow up on prescription request. Pt was appreciative and shared that she has an appointment with ortho today, and an appointment with the pain clinic next week. No additional concerns noted.

## 2019-04-20 NOTE — Telephone Encounter (Signed)
Patient called stating that the medication needs a PA before the pharmacy can fill the medication.  CB#[phone].  Thank you
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
## 2019-04-20 NOTE — Telephone Encounter (Signed)
Submitted PA request to [employer] - awaiting results.

## 2019-04-21 ENCOUNTER — Encounter: Payer: Medicaid Other | Admitting: Infectious Diseases

## 2019-04-21 NOTE — Telephone Encounter (Signed)
Medication approved. Pharmacy aware. Could not reach patient by phone, but she can check with the pharmacy.

## 2019-04-27 ENCOUNTER — Ambulatory Visit: Payer: Medicaid Other | Admitting: Family Medicine

## 2019-05-02 ENCOUNTER — Other Ambulatory Visit: Payer: Self-pay

## 2019-05-02 ENCOUNTER — Ambulatory Visit (INDEPENDENT_AMBULATORY_CARE_PROVIDER_SITE_OTHER): Payer: Medicaid Other | Admitting: Family Medicine

## 2019-05-02 DIAGNOSIS — I10 Essential (primary) hypertension: Secondary | ICD-10-CM

## 2019-05-02 DIAGNOSIS — F32A Depression, unspecified: Secondary | ICD-10-CM

## 2019-05-02 DIAGNOSIS — G8929 Other chronic pain: Secondary | ICD-10-CM

## 2019-05-02 DIAGNOSIS — F419 Anxiety disorder, unspecified: Secondary | ICD-10-CM

## 2019-05-02 DIAGNOSIS — J449 Chronic obstructive pulmonary disease, unspecified: Secondary | ICD-10-CM

## 2019-05-02 DIAGNOSIS — M545 Low back pain, unspecified: Secondary | ICD-10-CM

## 2019-05-02 DIAGNOSIS — K219 Gastro-esophageal reflux disease without esophagitis: Secondary | ICD-10-CM

## 2019-05-02 DIAGNOSIS — F329 Major depressive disorder, single episode, unspecified: Secondary | ICD-10-CM

## 2019-05-02 DIAGNOSIS — F3132 Bipolar disorder, current episode depressed, moderate: Secondary | ICD-10-CM

## 2019-05-02 MED ORDER — HYDROXYZINE HCL 50 MG PO TABS: 50.0000 mg | ORAL_TABLET | Freq: Three times a day (TID) | ORAL | 0 refills | Status: AC

## 2019-05-02 MED ORDER — TIOTROPIUM BROMIDE MONOHYDRATE 18 MCG IN CAPS: 18.0000 ug | ORAL_CAPSULE | Freq: Every day | RESPIRATORY_TRACT | 3 refills | Status: AC

## 2019-05-02 MED ORDER — COMBIVENT RESPIMAT 20-100 MCG/ACT IN AERS: 1.0000 | INHALATION_SPRAY | Freq: Four times a day (QID) | RESPIRATORY_TRACT | 5 refills | Status: AC | PRN

## 2019-05-02 MED ORDER — FOLIC ACID 1 MG PO TABS: 1.0000 mg | ORAL_TABLET | Freq: Every day | ORAL | 2 refills | Status: AC

## 2019-05-02 MED ORDER — ALBUTEROL SULFATE HFA 108 (90 BASE) MCG/ACT IN AERS: 1.0000 | INHALATION_SPRAY | Freq: Four times a day (QID) | RESPIRATORY_TRACT | 99 refills | Status: AC | PRN

## 2019-05-02 MED ORDER — BUDESONIDE-FORMOTEROL FUMARATE 160-4.5 MCG/ACT IN AERO: 2.0000 | INHALATION_SPRAY | Freq: Two times a day (BID) | RESPIRATORY_TRACT | 3 refills | Status: AC

## 2019-05-02 MED ORDER — TRAZODONE HCL 100 MG PO TABS: 100.0000 mg | ORAL_TABLET | Freq: Every evening | ORAL | 1 refills | Status: AC | PRN

## 2019-05-02 MED ORDER — PAROXETINE HCL 20 MG PO TABS: 20.0000 mg | ORAL_TABLET | Freq: Every day | ORAL | 2 refills | Status: AC

## 2019-05-02 MED ORDER — GABAPENTIN 400 MG PO CAPS: 800.0000 mg | ORAL_CAPSULE | Freq: Three times a day (TID) | ORAL | 0 refills | Status: AC

## 2019-05-02 MED ORDER — PANTOPRAZOLE SODIUM 40 MG PO TBEC: 40.0000 mg | DELAYED_RELEASE_TABLET | Freq: Every day | ORAL | 3 refills | Status: AC

## 2019-05-02 MED ORDER — DILTIAZEM HCL ER COATED BEADS 120 MG PO CP24: 120.0000 mg | ORAL_CAPSULE | Freq: Every day | ORAL | 2 refills | Status: AC
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
## 2019-05-05 ENCOUNTER — Encounter: Payer: Self-pay | Admitting: General Practice

## 2019-05-05 NOTE — Telephone Encounter (Signed)
Social Work Intern placed call to pt to f/u about mental health and housing resources per apt with Dr. Chapman Fitch 05/02/19. Pt requested a call back at a later time.

## 2019-05-09 ENCOUNTER — Telehealth: Payer: Self-pay | Admitting: Licensed Clinical Social Worker

## 2019-05-09 NOTE — Telephone Encounter (Signed)
Social Work Intern placed call to patient to f/u regarding housing and mental health resources per Dr. Siri Cole request from 05/02/2019 televisit encounter. Pt did not answer, unable to leave voicemail.

## 2019-05-31 DEATH — deceased

## 2019-06-02 ENCOUNTER — Encounter: Payer: Self-pay | Admitting: Family Medicine
